# Patient Record
Sex: Female | Born: 1990
Health system: Southern US, Community
[De-identification: ages and names within clinical notes are randomized; demographics above are authoritative.]

## PROBLEM LIST (undated history)

## (undated) ENCOUNTER — Inpatient Hospital Stay (HOSPITAL_COMMUNITY): Payer: Self-pay

## (undated) DIAGNOSIS — R0602 Shortness of breath: Secondary | ICD-10-CM

## (undated) DIAGNOSIS — Z8619 Personal history of other infectious and parasitic diseases: Secondary | ICD-10-CM

## (undated) DIAGNOSIS — O24419 Gestational diabetes mellitus in pregnancy, unspecified control: Secondary | ICD-10-CM

## (undated) DIAGNOSIS — G8929 Other chronic pain: Secondary | ICD-10-CM

## (undated) DIAGNOSIS — M549 Dorsalgia, unspecified: Secondary | ICD-10-CM

## (undated) DIAGNOSIS — I10 Essential (primary) hypertension: Secondary | ICD-10-CM

## (undated) DIAGNOSIS — R519 Headache, unspecified: Secondary | ICD-10-CM

## (undated) DIAGNOSIS — R51 Headache: Secondary | ICD-10-CM

## (undated) DIAGNOSIS — J189 Pneumonia, unspecified organism: Secondary | ICD-10-CM

## (undated) DIAGNOSIS — A749 Chlamydial infection, unspecified: Secondary | ICD-10-CM

## (undated) DIAGNOSIS — R079 Chest pain, unspecified: Secondary | ICD-10-CM

## (undated) HISTORY — PX: NO PAST SURGERIES: SHX2092

## (undated) HISTORY — DX: Personal history of other infectious and parasitic diseases: Z86.19

## (undated) HISTORY — DX: Gestational diabetes mellitus in pregnancy, unspecified control: O24.419

---

## 2010-12-21 ENCOUNTER — Inpatient Hospital Stay (INDEPENDENT_AMBULATORY_CARE_PROVIDER_SITE_OTHER)
Admission: RE | Admit: 2010-12-21 | Discharge: 2010-12-21 | Disposition: A | Discharge status: Home / Self Care | Payer: Medicaid Other | Source: Ambulatory Visit

## 2010-12-21 DIAGNOSIS — M62838 Other muscle spasm: Secondary | ICD-10-CM

## 2011-05-07 ENCOUNTER — Emergency Department (HOSPITAL_COMMUNITY)
Admission: EM | Admit: 2011-05-07 | Discharge: 2011-05-07 | Disposition: A | Discharge status: Home / Self Care | Payer: Medicaid Other | Attending: Emergency Medicine | Admitting: Emergency Medicine

## 2011-05-07 DIAGNOSIS — J3489 Other specified disorders of nose and nasal sinuses: Secondary | ICD-10-CM | POA: Insufficient documentation

## 2011-05-07 DIAGNOSIS — J029 Acute pharyngitis, unspecified: Secondary | ICD-10-CM | POA: Insufficient documentation

## 2011-05-07 DIAGNOSIS — R6883 Chills (without fever): Secondary | ICD-10-CM | POA: Insufficient documentation

## 2011-06-24 ENCOUNTER — Encounter (HOSPITAL_COMMUNITY): Payer: Self-pay | Admitting: *Deleted

## 2011-06-24 ENCOUNTER — Inpatient Hospital Stay (HOSPITAL_COMMUNITY)
Admission: AD | Admit: 2011-06-24 | Discharge: 2011-06-24 | Disposition: A | Discharge status: Home / Self Care | Payer: Medicaid Other | Source: Ambulatory Visit | Attending: Family Medicine | Admitting: Family Medicine

## 2011-06-24 ENCOUNTER — Inpatient Hospital Stay (HOSPITAL_COMMUNITY): Payer: Medicaid Other

## 2011-06-24 DIAGNOSIS — A499 Bacterial infection, unspecified: Secondary | ICD-10-CM | POA: Insufficient documentation

## 2011-06-24 DIAGNOSIS — B9689 Other specified bacterial agents as the cause of diseases classified elsewhere: Secondary | ICD-10-CM | POA: Insufficient documentation

## 2011-06-24 DIAGNOSIS — N760 Acute vaginitis: Secondary | ICD-10-CM

## 2011-06-24 DIAGNOSIS — O209 Hemorrhage in early pregnancy, unspecified: Secondary | ICD-10-CM | POA: Insufficient documentation

## 2011-06-24 DIAGNOSIS — O2390 Unspecified genitourinary tract infection in pregnancy, unspecified trimester: Secondary | ICD-10-CM | POA: Insufficient documentation

## 2011-06-24 HISTORY — DX: Other chronic pain: G89.29

## 2011-06-24 HISTORY — DX: Dorsalgia, unspecified: M54.9

## 2011-06-24 LAB — CBC
Hemoglobin: 14.1 g/dL (ref 12.0–15.0)
MCH: 29.9 pg (ref 26.0–34.0)
MCV: 88.7 fL (ref 78.0–100.0)
RBC: 4.71 MIL/uL (ref 3.87–5.11)
WBC: 5.7 10*3/uL (ref 4.0–10.5)

## 2011-06-24 LAB — POCT PREGNANCY, URINE: Preg Test, Ur: POSITIVE

## 2011-06-24 LAB — URINALYSIS, ROUTINE W REFLEX MICROSCOPIC
Glucose, UA: NEGATIVE mg/dL
Leukocytes, UA: NEGATIVE
Nitrite: NEGATIVE
pH: 6 (ref 5.0–8.0)

## 2011-06-24 LAB — WET PREP, GENITAL: Yeast Wet Prep HPF POC: NONE SEEN

## 2011-06-24 LAB — URINE MICROSCOPIC-ADD ON

## 2011-06-24 LAB — ABO/RH: ABO/RH(D): A POS

## 2011-06-24 LAB — OB RESULTS CONSOLE HIV ANTIBODY (ROUTINE TESTING): HIV: NONREACTIVE

## 2011-06-24 LAB — OB RESULTS CONSOLE HEPATITIS B SURFACE ANTIGEN: Hepatitis B Surface Ag: NEGATIVE

## 2011-06-24 MED ORDER — ONDANSETRON 8 MG PO TBDP: 8.0000 mg | ORAL_TABLET | Freq: Three times a day (TID) | ORAL | Status: AC | PRN

## 2011-06-24 MED ORDER — PROMETHAZINE HCL 25 MG PO TABS: 25.0000 mg | ORAL_TABLET | Freq: Four times a day (QID) | ORAL | Status: DC | PRN

## 2011-06-24 MED ORDER — ONDANSETRON 8 MG PO TBDP
8.0000 mg | ORAL_TABLET | Freq: Once | ORAL | Status: AC
  Administered 2011-06-24: 8 mg via ORAL
  Filled 2011-06-24: qty 1

## 2011-06-24 MED ORDER — METRONIDAZOLE 500 MG PO TABS: 500.0000 mg | ORAL_TABLET | Freq: Two times a day (BID) | ORAL | Status: DC

## 2011-06-24 NOTE — Progress Notes (Signed)
Found out recently is preg.  Last night started bleeding. Woke up this morning there was spots on the pad. After voiding- more blood on paper.  Went to [facility]- had blood work done.

## 2011-06-24 NOTE — Progress Notes (Signed)
Pt in c/o vaginal bleeding since last night.  Put on pad last night, had spots on pad when she woke up today.  Reports small amount of bleeding after wiping,  Denies any cramping.  LMP 05/28/2011

## 2011-06-24 NOTE — ED Provider Notes (Signed)
History     Chief Complaint  Patient presents with  . Vaginal Bleeding   HPI Pt is early pregnantG1 with nausea and vomiting for about 2 weeks and vaginal spotting since yesterday. Pt went an Urgent Care with her nausea and vomiting and was told she was pregnant yesterday.  They did an Quant HCG which was 7.5. Pt denies any cramping constipation or UTI symptoms.  She had a loose bowel movement this morning and again noticed the spotting when she wiped.  Pt was not using anything for birth control.    Past Medical History  Diagnosis Date  . Chronic back pain     Past Surgical History  Procedure Date  . No past surgeries     No family history on file.  History  Substance Use Topics  . Smoking status: Former Games developer  . Smokeless tobacco: Not on file  . Alcohol Use: No    Allergies: No Known Allergies  Prescriptions prior to admission  Medication Sig Dispense Refill  . ibuprofen (ADVIL,MOTRIN) 800 MG tablet Take 800 mg by mouth every 8 (eight) hours as needed. Takes as needed for menstrual cramps or headache       . OVER THE COUNTER MEDICATION Take 1 capsule by mouth once. Pt states that she took a nyquil liquid cap one time      . prenatal vitamin w/FE, FA (PRENATAL 1 + 1) 27-1 MG TABS Take 1 tablet by mouth daily.        . Pseudoeph-Doxylamine-DM-APAP (NYQUIL PO) Take by mouth.          Review of Systems  Constitutional: Negative for fever and chills.  Gastrointestinal: Positive for nausea and vomiting. Negative for abdominal pain, diarrhea and constipation.  Genitourinary: Negative for dysuria and urgency.   Physical Exam   Blood pressure 134/69, pulse 63, temperature 98.5 F (36.9 C), temperature source Oral, resp. rate 20, height 5\' 4"  (1.626 m), weight 150 lb (68.04 kg), last menstrual period 05/28/2011.  Physical Exam  Vitals reviewed. Constitutional: She is oriented to person, place, and time. She appears well-developed and well-nourished.  Eyes: Pupils are  equal, round, and reactive to light.  Neck: Normal range of motion. Neck supple.  Cardiovascular: Normal rate.   Respiratory: Effort normal.  GI: Soft.  Genitourinary:       Small amount of opaque pink/red vaginal discharge in vault; cervix clean, nontender, uterus NSSC nontender;a dnexal without palpable enlargement or tenderness  Musculoskeletal: Normal range of motion.  Neurological: She is alert and oriented to person, place, and time.  Skin: Skin is warm and dry.  Psychiatric: She has a normal mood and affect.    MAU Course  Procedures Urinalysis- neg except for mod hemoglobin (many epith) CBC- normal HCG-45 ABO-Rh- positive Wet prep-mod clue cells GC/chlamydia- pending Ultrasound- normal -no evidence of pregnancy    Assessment and Plan  Bleeding in early pregnancy-return in 48 hours for repeat HCG Bacterial vaginosis-Rx for Flagyl 500mg  BID for 7 days  LINEBERRY,SUSAN 06/24/2011, 12:36 PM

## 2011-06-24 NOTE — Progress Notes (Signed)
Was seen at Urgent Care yesterday had BHCG of 7.6.

## 2011-06-25 LAB — GC/CHLAMYDIA PROBE AMP, GENITAL: GC Probe Amp, Genital: NEGATIVE

## 2011-06-26 ENCOUNTER — Inpatient Hospital Stay (HOSPITAL_COMMUNITY)
Admission: AD | Admit: 2011-06-26 | Discharge: 2011-06-26 | Disposition: A | Discharge status: Home / Self Care | Payer: Medicaid Other | Source: Ambulatory Visit | Attending: Obstetrics & Gynecology | Admitting: Obstetrics & Gynecology

## 2011-06-26 ENCOUNTER — Encounter (HOSPITAL_COMMUNITY): Payer: Self-pay | Admitting: Advanced Practice Midwife

## 2011-06-26 DIAGNOSIS — O26859 Spotting complicating pregnancy, unspecified trimester: Secondary | ICD-10-CM

## 2011-06-26 LAB — HCG, QUANTITATIVE, PREGNANCY: hCG, Beta Chain, Quant, S: 165 m[IU]/mL — ABNORMAL HIGH (ref <5)

## 2011-06-26 NOTE — ED Provider Notes (Signed)
Attestation of Attending Supervision of Advanced Practitioner: Evaluation and management procedures were performed by the PA/NP/CNM/OB Fellow under my supervision/collaboration. Chart reviewed and agree with management and plan.  Jimmie Rueter A 06/26/2011 4:51 PM

## 2011-06-26 NOTE — Progress Notes (Signed)
No spotting today, had spotting last night very light no pain.

## 2011-06-26 NOTE — ED Provider Notes (Signed)
History     Chief Complaint  Patient presents with  . Vaginal Bleeding   HPI Here for repeat quant. Was seen 2 days ago with spotting, no pain or bleeding today. Quant was 45 on 10/18, no IUP or adnexal mass on u/s.   OB History    Grav Para Term Preterm Abortions TAB SAB Ect Mult Living   1               Past Medical History  Diagnosis Date  . Chronic back pain     Past Surgical History  Procedure Date  . No past surgeries     No family history on file.  History  Substance Use Topics  . Smoking status: Former Games developer  . Smokeless tobacco: Not on file  . Alcohol Use: No    Allergies: No Known Allergies  Prescriptions prior to admission  Medication Sig Dispense Refill  . metroNIDAZOLE (FLAGYL) 500 MG tablet Take 1 tablet (500 mg total) by mouth 2 (two) times daily.  14 tablet  0  . ondansetron (ZOFRAN ODT) 8 MG disintegrating tablet Take 1 tablet (8 mg total) by mouth every 8 (eight) hours as needed for nausea.  20 tablet  0  . OVER THE COUNTER MEDICATION Take 1 capsule by mouth once. Pt states that she took a nyquil liquid cap one time      . prenatal vitamin w/FE, FA (PRENATAL 1 + 1) 27-1 MG TABS Take 1 tablet by mouth daily.        . promethazine (PHENERGAN) 25 MG tablet Take 1 tablet (25 mg total) by mouth every 6 (six) hours as needed for nausea.  30 tablet  0  . Pseudoeph-Doxylamine-DM-APAP (NYQUIL PO) Take by mouth.          Review of Systems  Constitutional: Negative.   Respiratory: Negative.   Cardiovascular: Negative.   Gastrointestinal: Negative.   Genitourinary: Negative.   Musculoskeletal: Negative.   Neurological: Negative.   Psychiatric/Behavioral: Negative.    Physical Exam   Blood pressure 132/72, pulse 65, temperature 98.4 F (36.9 C), temperature source Oral, resp. rate 18, last menstrual period 05/28/2011.  Physical Exam  Nursing note and vitals reviewed. Constitutional: She is oriented to person, place, and time. She appears  well-developed and well-nourished. No distress.  Cardiovascular: Normal rate.   Respiratory: Effort normal.  GI: Soft. She exhibits no distension.  Musculoskeletal: Normal range of motion.  Neurological: She is alert and oriented to person, place, and time.  Skin: Skin is warm and dry.  Psychiatric: She has a normal mood and affect.    MAU Course  Procedures Results for orders placed during the hospital encounter of 06/26/11 (from the past 24 hour(s))  HCG, QUANTITATIVE, PREGNANCY     Status: Abnormal   Collection Time   06/26/11 10:25 AM      Component Value Range   hCG, Beta Chain, Quant, S 165 (*) <5 (mIU/mL)     Assessment and Plan  Normal rise in HCG, f/u for u/s on Monday 10/29, precautions rev'd  FRAZIER,NATALIE 06/26/2011, 11:24 AM

## 2011-06-27 ENCOUNTER — Inpatient Hospital Stay (HOSPITAL_COMMUNITY)
Admission: AD | Admit: 2011-06-27 | Discharge: 2011-06-27 | Disposition: A | Discharge status: Home / Self Care | Payer: Medicaid Other | Source: Ambulatory Visit | Attending: Obstetrics & Gynecology | Admitting: Obstetrics & Gynecology

## 2011-06-27 DIAGNOSIS — O210 Mild hyperemesis gravidarum: Secondary | ICD-10-CM | POA: Insufficient documentation

## 2011-06-27 DIAGNOSIS — O219 Vomiting of pregnancy, unspecified: Secondary | ICD-10-CM

## 2011-06-27 LAB — URINE MICROSCOPIC-ADD ON

## 2011-06-27 LAB — URINALYSIS, ROUTINE W REFLEX MICROSCOPIC
Bilirubin Urine: NEGATIVE
Ketones, ur: NEGATIVE mg/dL
Leukocytes, UA: NEGATIVE
Nitrite: NEGATIVE
Urobilinogen, UA: 0.2 mg/dL (ref 0.0–1.0)
pH: 7 (ref 5.0–8.0)

## 2011-06-27 NOTE — Progress Notes (Signed)
Pt also states chest feel tight since I've been here."

## 2011-06-27 NOTE — Progress Notes (Signed)
Pt states, " I've been vomiting for 2 wks . Tonight I took my medicine for nausea and it made me dizzy and lightheaded. I got more nauseated and started vomiting."

## 2011-06-27 NOTE — ED Provider Notes (Signed)
History     Chief Complaint  Patient presents with  . Morning Sickness   HPI Pamela Klein 20 y.o. 4w 2d gestation.  Arrived in MAU with several episodes of severe vomiting.  Was hard to get her breath when vomiting.   OB History    Grav Para Term Preterm Abortions TAB SAB Ect Mult Living   1               Past Medical History  Diagnosis Date  . Chronic back pain     Past Surgical History  Procedure Date  . No past surgeries     No family history on file.  History  Substance Use Topics  . Smoking status: Former Games developer  . Smokeless tobacco: Not on file  . Alcohol Use: No    Allergies: No Known Allergies  Prescriptions prior to admission  Medication Sig Dispense Refill  . metroNIDAZOLE (FLAGYL) 500 MG tablet Take 1 tablet (500 mg total) by mouth 2 (two) times daily.  14 tablet  0  . ondansetron (ZOFRAN ODT) 8 MG disintegrating tablet Take 1 tablet (8 mg total) by mouth every 8 (eight) hours as needed for nausea.  20 tablet  0  . OVER THE COUNTER MEDICATION Take 1 capsule by mouth once. Pt states that she took a nyquil liquid cap one time      . prenatal vitamin w/FE, FA (PRENATAL 1 + 1) 27-1 MG TABS Take 1 tablet by mouth daily.        . promethazine (PHENERGAN) 25 MG tablet Take 1 tablet (25 mg total) by mouth every 6 (six) hours as needed for nausea.  30 tablet  0  . Pseudoeph-Doxylamine-DM-APAP (NYQUIL PO) Take by mouth.          Review of Systems  Gastrointestinal: Positive for nausea and vomiting.   Physical Exam   Blood pressure 138/88, pulse 87, temperature 98.6 F (37 C), temperature source Oral, resp. rate 20, height 5\' 4"  (1.626 m), weight 150 lb (68.04 kg), last menstrual period 05/28/2011.  Physical Exam  Nursing note and vitals reviewed. Constitutional: She is oriented to person, place, and time. She appears well-developed and well-nourished.  HENT:  Head: Normocephalic.  Eyes: EOM are normal.  Neck: Neck supple.  Musculoskeletal: Normal  range of motion.  Neurological: She is alert and oriented to person, place, and time.  Skin: Skin is warm and dry.  Psychiatric: She has a normal mood and affect.    MAU Course  Procedures  MDM Client has rested in bed and is now feeling much better.  Is ready to go home.  Has not had vomiting previously in this pregnancy.  Was here yesterday for BHCG follow up.   Reviewed her medications with her - has Phenergan tablets and Zofran ODT.  Was taking metronidazole but suspect the metronidazole is causing the gastric upset.  Advised her to stop Metronidazole at present.  Assessment and Plan  Nausea and vomiting.  Plan: Take your medications for nausea as ordered Keep your appointment for your ultrasound on Monday 07-05-11 Return sooner if you have worsening symptoms.  BURLESON,TERRI 06/27/2011, 1:19 AM   Nolene Bernheim, NP 06/27/11 0123

## 2011-06-28 NOTE — ED Provider Notes (Signed)
Chart reviewed and agree with management and plan.  

## 2011-07-05 ENCOUNTER — Ambulatory Visit (HOSPITAL_COMMUNITY)
Admission: RE | Admit: 2011-07-05 | Discharge: 2011-07-05 | Disposition: A | Discharge status: Home / Self Care | Payer: Medicaid Other | Source: Ambulatory Visit | Attending: Advanced Practice Midwife | Admitting: Advanced Practice Midwife

## 2011-07-05 ENCOUNTER — Ambulatory Visit (HOSPITAL_COMMUNITY): Payer: Medicaid Other

## 2011-07-05 DIAGNOSIS — O26859 Spotting complicating pregnancy, unspecified trimester: Secondary | ICD-10-CM

## 2011-07-05 DIAGNOSIS — Z3689 Encounter for other specified antenatal screening: Secondary | ICD-10-CM | POA: Insufficient documentation

## 2011-07-05 DIAGNOSIS — R109 Unspecified abdominal pain: Secondary | ICD-10-CM | POA: Insufficient documentation

## 2011-07-05 DIAGNOSIS — O99891 Other specified diseases and conditions complicating pregnancy: Secondary | ICD-10-CM | POA: Insufficient documentation

## 2011-07-08 ENCOUNTER — Inpatient Hospital Stay (HOSPITAL_COMMUNITY)
Admission: AD | Admit: 2011-07-08 | Discharge: 2011-07-08 | Disposition: A | Discharge status: Home / Self Care | Payer: Medicaid Other | Source: Ambulatory Visit | Attending: Obstetrics & Gynecology | Admitting: Obstetrics & Gynecology

## 2011-07-08 ENCOUNTER — Encounter (HOSPITAL_COMMUNITY): Payer: Self-pay

## 2011-07-08 DIAGNOSIS — O210 Mild hyperemesis gravidarum: Secondary | ICD-10-CM | POA: Insufficient documentation

## 2011-07-08 DIAGNOSIS — O219 Vomiting of pregnancy, unspecified: Secondary | ICD-10-CM

## 2011-07-08 LAB — URINALYSIS, ROUTINE W REFLEX MICROSCOPIC
Glucose, UA: NEGATIVE mg/dL
Leukocytes, UA: NEGATIVE
Nitrite: NEGATIVE
Specific Gravity, Urine: >1.03 — ABNORMAL HIGH (ref 1.005–1.030)
pH: 6 (ref 5.0–8.0)

## 2011-07-08 LAB — COMPREHENSIVE METABOLIC PANEL
ALT: 10 U/L (ref 0–35)
Alkaline Phosphatase: 48 U/L (ref 39–117)
CO2: 23 mEq/L (ref 19–32)
Chloride: 100 mEq/L (ref 96–112)
GFR calc Af Amer: >90 mL/min (ref >90)
GFR calc non Af Amer: >90 mL/min (ref >90)
Glucose, Bld: 89 mg/dL (ref 70–99)
Potassium: 3.6 mEq/L (ref 3.5–5.1)
Sodium: 132 mEq/L — ABNORMAL LOW (ref 135–145)
Total Bilirubin: 0.2 mg/dL — ABNORMAL LOW (ref 0.3–1.2)

## 2011-07-08 LAB — CBC
HCT: 40 % (ref 36.0–46.0)
Hemoglobin: 13.8 g/dL (ref 12.0–15.0)
RBC: 4.58 MIL/uL (ref 3.87–5.11)

## 2011-07-08 MED ORDER — PROMETHAZINE HCL 25 MG/ML IJ SOLN
25.0000 mg | Freq: Once | INTRAMUSCULAR | Status: AC
  Administered 2011-07-08: 25 mg via INTRAVENOUS
  Filled 2011-07-08: qty 1

## 2011-07-08 MED ORDER — LACTATED RINGERS IV BOLUS (SEPSIS)
1000.0000 mL | Freq: Once | INTRAVENOUS | Status: AC
  Administered 2011-07-08: 1000 mL via INTRAVENOUS

## 2011-07-08 MED ORDER — PROMETHAZINE HCL 25 MG RE SUPP: 25.0000 mg | Freq: Four times a day (QID) | RECTAL | Status: DC | PRN

## 2011-07-08 NOTE — Progress Notes (Signed)
Patient states she has been having vomiting for a while, worse today. Has been dizzy and has felt feverish(has not taken temp) and lower abdominal cramping.

## 2011-07-08 NOTE — ED Provider Notes (Addendum)
History   Pt presents today c/o worsening N&V over the past 2 days. She states she can no longer keep anything on her stomach. She denies vag dc, bleeding, although she thinks she may have had a fever. She has tried po phenergan and zofran ODT without relief.  Chief Complaint  Patient presents with  . Emesis   HPI  OB History    Grav Para Term Preterm Abortions TAB SAB Ect Mult Living   1               Past Medical History  Diagnosis Date  . Chronic back pain     Past Surgical History  Procedure Date  . No past surgeries     No family history on file.  History  Substance Use Topics  . Smoking status: Former Games developer  . Smokeless tobacco: Not on file  . Alcohol Use: No    Allergies: No Known Allergies  Prescriptions prior to admission  Medication Sig Dispense Refill  . ondansetron (ZOFRAN-ODT) 8 MG disintegrating tablet Take 8 mg by mouth every 8 (eight) hours as needed. nausea       . prenatal vitamin w/FE, FA (PRENATAL 1 + 1) 27-1 MG TABS Take 1 tablet by mouth daily.       . promethazine (PHENERGAN) 25 MG tablet Take 25 mg by mouth every 6 (six) hours as needed. Nausea/sleep          Review of Systems  Constitutional: Positive for fever.  Cardiovascular: Negative for chest pain.  Gastrointestinal: Positive for nausea, vomiting and constipation. Negative for abdominal pain and diarrhea.  Genitourinary: Negative for dysuria, urgency, frequency and hematuria.  Neurological: Positive for dizziness. Negative for headaches.  Psychiatric/Behavioral: Negative for depression and suicidal ideas.   Physical Exam   Blood pressure 140/114, pulse 90, temperature 99.4 F (37.4 C), temperature source Oral, resp. rate 16, height 5' 5.5" (1.664 m), weight 148 lb 12.8 oz (67.495 kg), last menstrual period 05/28/2011, SpO2 98.00%.  Physical Exam  Nursing note and vitals reviewed. Constitutional: She is oriented to person, place, and time. She appears well-developed and  well-nourished. No distress.  HENT:  Head: Normocephalic and atraumatic.  Eyes: EOM are normal. Pupils are equal, round, and reactive to light.  GI: Soft. She exhibits no distension. There is no tenderness. There is no rebound and no guarding.  Neurological: She is alert and oriented to person, place, and time.  Skin: Skin is warm and dry. She is not diaphoretic.  Psychiatric: She has a normal mood and affect. Her behavior is normal. Judgment and thought content normal.    MAU Course  Procedures   Assessment and Plan  Care of pt turned over to Georges Mouse, CNM.  Clinton Gallant. Rice III, DrHSc, MPAS, PA-C  07/08/2011, 7:31 PM   Henrietta Hoover, PA 07/08/11 1949   Following IV hydration and Phenergan IV, pt tolerating ice chips. If no vomiting once liter of fluid is complete, will d/c home with phenergan suppositories.   A/P: 20 y.o. G1P0 at [redacted]w[redacted]d N/V of pregnancy Rx phenergan suppositories Has appt scheduled at [facility] for prenatal care on 11/20

## 2011-07-08 NOTE — ED Provider Notes (Signed)
Attestation of Attending Supervision of Advanced Practitioner: Evaluation and management procedures were performed by the PA/NP/CNM/OB Fellow under my supervision/collaboration. Chart reviewed and agree with management and plan.  Jaynie Collins A M.D. 07/08/2011 8:03 PM

## 2011-07-12 NOTE — ED Provider Notes (Signed)
Attestation of Attending Supervision of Advanced Practitioner: Evaluation and management procedures were performed by the PA/NP/CNM/OB Fellow under my supervision/collaboration. Chart reviewed, and agree with management and plan.  Jaynie Collins A M.D. 07/12/2011 11:10 AM

## 2011-07-27 LAB — OB RESULTS CONSOLE ANTIBODY SCREEN: Antibody Screen: NEGATIVE

## 2011-09-07 NOTE — L&D Delivery Note (Signed)
Delivery Note  Pt was involuntarily pushing and cervix was complete at 2146, AROM w mod mec noted and vertex began to crown,   At 9:50 PM a viable female was delivered via Vaginal, Spontaneous Delivery (Presentation: Right Occiput Anterior).  Delivered through loose nuchal cord, and as I was somersaulting baby cord avulsed at umbilicus, it was then immediately clamped,  APGAR: 8, 9; weight 5 lb 0.4 oz (2279 g).  Dr. Francine Graven from NICU came to assess infant  Placenta status: Intact, Spontaneous Pathology.  Cord: 3 vessels with the following complications: .  Cord pH: n/a  Anesthesia: Local  Episiotomy: None Lacerations: R Labial;1st degree Suture Repair: 3.0 vicryl rapide Est. Blood Loss (mL): 300  Mom to postpartum.  Baby to nursery-stable. Skin-skin w mom Pt plans breast and bottle   Cheyne Bungert M 02/27/2012, 10:27 PM

## 2011-09-11 ENCOUNTER — Inpatient Hospital Stay (HOSPITAL_COMMUNITY)
Admission: AD | Admit: 2011-09-11 | Discharge: 2011-09-12 | Disposition: A | Discharge status: Home / Self Care | Payer: Medicaid Other | Source: Ambulatory Visit | Attending: Family Medicine | Admitting: Family Medicine

## 2011-09-11 DIAGNOSIS — O99891 Other specified diseases and conditions complicating pregnancy: Secondary | ICD-10-CM | POA: Insufficient documentation

## 2011-09-11 DIAGNOSIS — N949 Unspecified condition associated with female genital organs and menstrual cycle: Secondary | ICD-10-CM

## 2011-09-11 DIAGNOSIS — R109 Unspecified abdominal pain: Secondary | ICD-10-CM | POA: Insufficient documentation

## 2011-09-11 HISTORY — DX: Chlamydial infection, unspecified: A74.9

## 2011-09-12 ENCOUNTER — Encounter (HOSPITAL_COMMUNITY): Payer: Self-pay

## 2011-09-12 LAB — URINALYSIS, ROUTINE W REFLEX MICROSCOPIC
Bilirubin Urine: NEGATIVE
Ketones, ur: NEGATIVE mg/dL
Nitrite: NEGATIVE
Protein, ur: NEGATIVE mg/dL
Urobilinogen, UA: 0.2 mg/dL (ref 0.0–1.0)
pH: 7 (ref 5.0–8.0)

## 2011-09-12 LAB — WET PREP, GENITAL: Clue Cells Wet Prep HPF POC: NONE SEEN

## 2011-09-12 MED ORDER — ACETAMINOPHEN-CODEINE #3 300-30 MG PO TABS
1.0000 | ORAL_TABLET | Freq: Once | ORAL | Status: AC
  Administered 2011-09-12: 1 via ORAL
  Filled 2011-09-12: qty 1

## 2011-09-12 NOTE — ED Provider Notes (Signed)
Chart reviewed and agree with management and plan.  

## 2011-09-12 NOTE — Progress Notes (Signed)
Pt states, " I was hitting down and  and I started having pain in my right lower abdomen, and now it has moved to the left lower abdomen also but not as bad. I've had a brownish discharge today."

## 2011-09-12 NOTE — ED Provider Notes (Signed)
History     Chief Complaint  Patient presents with  . Abdominal Pain   HPI  Pt here with report of cramping pain that started today.  Denies UTI symptoms or vaginal bleeding.  +vaginal discharge, light brown.  Denies odor.    Past Medical History  Diagnosis Date  . Chronic back pain   . Chlamydia     Past Surgical History  Procedure Date  . No past surgeries     History reviewed. No pertinent family history.  History  Substance Use Topics  . Smoking status: Former Games developer  . Smokeless tobacco: Not on file  . Alcohol Use: No    Allergies: No Known Allergies  Prescriptions prior to admission  Medication Sig Dispense Refill  . prenatal vitamin w/FE, FA (PRENATAL 1 + 1) 27-1 MG TABS Take 1 tablet by mouth daily.         Review of Systems  Gastrointestinal: Positive for abdominal pain.  Genitourinary:       Light brown discharge   Physical Exam   Blood pressure 137/73, pulse 100, temperature 98.5 F (36.9 C), temperature source Oral, resp. rate 20, height 5' 4.5" (1.638 m), weight 73.143 kg (161 lb 4 oz), last menstrual period 05/28/2011.  Physical Exam  Constitutional: She is oriented to person, place, and time. She appears well-developed and well-nourished.  HENT:  Head: Normocephalic.  Neck: Normal range of motion. Neck supple.  Cardiovascular: Normal rate, regular rhythm and normal heart sounds.   Respiratory: Effort normal and breath sounds normal.  Genitourinary: Guaiac stool:   No bleeding around the vagina.  Neurological: She is alert and oriented to person, place, and time.  Skin: Skin is warm and dry.  +FHTs  MAU Course  Procedures Results for orders placed during the hospital encounter of 09/11/11 (from the past 24 hour(s))  URINALYSIS, ROUTINE W REFLEX MICROSCOPIC     Status: Normal   Collection Time   09/12/11 12:15 AM      Component Value Range   Color, Urine YELLOW  YELLOW    APPearance CLEAR  CLEAR    Specific Gravity, Urine 1.010  1.005 -  1.030    pH 7.0  5.0 - 8.0    Glucose, UA NEGATIVE  NEGATIVE (mg/dL)   Hgb urine dipstick NEGATIVE  NEGATIVE    Bilirubin Urine NEGATIVE  NEGATIVE    Ketones, ur NEGATIVE  NEGATIVE (mg/dL)   Protein, ur NEGATIVE  NEGATIVE (mg/dL)   Urobilinogen, UA 0.2  0.0 - 1.0 (mg/dL)   Nitrite NEGATIVE  NEGATIVE    Leukocytes, UA NEGATIVE  NEGATIVE   WET PREP, GENITAL     Status: Abnormal   Collection Time   09/12/11  2:00 AM      Component Value Range   Yeast, Wet Prep NONE SEEN  NONE SEEN    Trich, Wet Prep NONE SEEN  NONE SEEN    Clue Cells, Wet Prep NONE SEEN  NONE SEEN    WBC, Wet Prep HPF POC FEW (*) NONE SEEN      Assessment and Plan  Round Ligament Pain  Plan: DC to home Reviewed pregnancy precautions GC/CT pending [facility] 09/12/2011, 1:19 AM

## 2011-09-12 NOTE — Progress Notes (Signed)
Patient is here with c/o new onset of lower/pelvic abdominal pain. She is tearful stating that the pain is severe. She c/o brown vaginal discharge. She denies any bleeding. Her next appointment with [facility] is 09/21/2011.

## 2011-09-13 LAB — GC/CHLAMYDIA PROBE AMP, GENITAL: Chlamydia, DNA Probe: NEGATIVE

## 2011-09-20 ENCOUNTER — Encounter (HOSPITAL_COMMUNITY): Payer: Self-pay | Admitting: *Deleted

## 2011-09-20 ENCOUNTER — Inpatient Hospital Stay (HOSPITAL_COMMUNITY)
Admission: AD | Admit: 2011-09-20 | Discharge: 2011-09-20 | Disposition: A | Discharge status: Home / Self Care | Payer: Medicaid Other | Source: Ambulatory Visit | Attending: Obstetrics | Admitting: Obstetrics

## 2011-09-20 DIAGNOSIS — A499 Bacterial infection, unspecified: Secondary | ICD-10-CM | POA: Insufficient documentation

## 2011-09-20 DIAGNOSIS — N760 Acute vaginitis: Secondary | ICD-10-CM | POA: Insufficient documentation

## 2011-09-20 DIAGNOSIS — O2340 Unspecified infection of urinary tract in pregnancy, unspecified trimester: Secondary | ICD-10-CM

## 2011-09-20 DIAGNOSIS — L293 Anogenital pruritus, unspecified: Secondary | ICD-10-CM | POA: Insufficient documentation

## 2011-09-20 DIAGNOSIS — O2390 Unspecified genitourinary tract infection in pregnancy, unspecified trimester: Secondary | ICD-10-CM | POA: Insufficient documentation

## 2011-09-20 DIAGNOSIS — B9689 Other specified bacterial agents as the cause of diseases classified elsewhere: Secondary | ICD-10-CM | POA: Insufficient documentation

## 2011-09-20 DIAGNOSIS — N390 Urinary tract infection, site not specified: Secondary | ICD-10-CM | POA: Insufficient documentation

## 2011-09-20 LAB — URINE MICROSCOPIC-ADD ON

## 2011-09-20 LAB — URINALYSIS, ROUTINE W REFLEX MICROSCOPIC
Ketones, ur: NEGATIVE mg/dL
Nitrite: NEGATIVE
Protein, ur: NEGATIVE mg/dL
pH: 7 (ref 5.0–8.0)

## 2011-09-20 LAB — WET PREP, GENITAL

## 2011-09-20 MED ORDER — LIDOCAINE HCL 2 % EX GEL
Freq: Once | CUTANEOUS | Status: AC
  Administered 2011-09-20: 10 via TOPICAL
  Filled 2011-09-20: qty 20

## 2011-09-20 MED ORDER — NITROFURANTOIN MONOHYD MACRO 100 MG PO CAPS: 100.0000 mg | ORAL_CAPSULE | Freq: Two times a day (BID) | ORAL | Status: AC

## 2011-09-20 MED ORDER — METRONIDAZOLE 500 MG PO TABS: 500.0000 mg | ORAL_TABLET | Freq: Two times a day (BID) | ORAL | Status: AC

## 2011-09-20 NOTE — Progress Notes (Signed)
Pt states vaginal pain/burning, having discharge brownish red in color. Unsure if she has std. Painful to void/wash/wipe. Last intercourse 1 week ago with same partner. Pain x5days.

## 2011-09-20 NOTE — ED Provider Notes (Signed)
History    G1 presents at 16w3 with vaginal discharge, itching, and burning that is painful and makes it difficult for pt to walk.  Symptoms started 1 week ago but have worsened since onset.  Reports some menstrual-like irregular cramping x2 weeks.  Also reports brown vaginal spotting x2 days.  Denies new sexual partner other than FOB.  No LOF other than brown discharge.  Chief Complaint  Patient presents with  . Vaginal Discharge   HPI  OB History    Grav Para Term Preterm Abortions TAB SAB Ect Mult Living   1               Past Medical History  Diagnosis Date  . Chronic back pain   . Chlamydia     Past Surgical History  Procedure Date  . No past surgeries     No family history on file.  History  Substance Use Topics  . Smoking status: Former Games developer  . Smokeless tobacco: Not on file  . Alcohol Use: No    Allergies: No Known Allergies  Prescriptions prior to admission  Medication Sig Dispense Refill  . prenatal vitamin w/FE, FA (PRENATAL 1 + 1) 27-1 MG TABS Take 1 tablet by mouth daily.         Review of Systems  Constitutional: Negative.   HENT: Negative.   Eyes: Negative.   Respiratory: Negative.   Cardiovascular: Negative.   Gastrointestinal: Negative.   Genitourinary: Negative.   Musculoskeletal: Negative.   Skin: Negative.   Neurological: Negative.   Endo/Heme/Allergies: Negative.   Psychiatric/Behavioral: Negative.    Physical Exam   Blood pressure 110/70, pulse 79, temperature 98.5 F (36.9 C), temperature source Oral, resp. rate 16, height 5\' 4"  (1.626 m), weight 73.936 kg (163 lb), last menstrual period 05/28/2011.  Physical Exam  Constitutional: She is oriented to person, place, and time. She appears well-developed and well-nourished.  Neck: Normal range of motion.  Respiratory: Effort normal.  GI: Soft.  Genitourinary: Cervix exhibits no motion tenderness and no friability. There is erythema and tenderness around the vagina.       Large  amount white creamy discharge on vaginal walls.  No lesions noted externally or with internal exam Cervix 0/th/hi  Neurological: She is alert and oriented to person, place, and time. She has normal reflexes.  Skin: Skin is warm and dry.  Psychiatric: She has a normal mood and affect. Her behavior is normal. Judgment and thought content normal.    MAU Course  Procedures U/A GC/Chlamydia  Wet prep Cervical exam  MDM Topical lidocaine given in MAU Reviewed care with Dr Clearance Coots and agrees with plan to treat BV and UTI and D/C home.  Assessment and Plan  A: Bacterial vaginosis UTI   P: D/C home Prescriptions for Flagyl and Macrobid Keep f/u appointment with [facility] scheduled tomorrow.   LEFTWICH-KIRBY, LISA 09/20/2011, 6:53 PM

## 2011-09-21 LAB — GC/CHLAMYDIA PROBE AMP, GENITAL: GC Probe Amp, Genital: NEGATIVE

## 2011-09-21 NOTE — ED Provider Notes (Signed)
Attestation of Attending Supervision of Advanced Practitioner: Evaluation and management procedures were performed by the PA/NP/CNM/OB Fellow under my supervision/collaboration. Chart reviewed, and agree with management and plan.  UGONNA ANYANWU, M.D. 09/21/2011 7:27 AM   

## 2011-10-25 ENCOUNTER — Inpatient Hospital Stay (HOSPITAL_COMMUNITY)
Admission: AD | Admit: 2011-10-25 | Discharge: 2011-10-25 | Disposition: A | Discharge status: Home / Self Care | Payer: Medicaid Other | Source: Ambulatory Visit | Attending: Obstetrics & Gynecology | Admitting: Obstetrics & Gynecology

## 2011-10-25 ENCOUNTER — Encounter (HOSPITAL_COMMUNITY): Payer: Self-pay

## 2011-10-25 DIAGNOSIS — B349 Viral infection, unspecified: Secondary | ICD-10-CM

## 2011-10-25 DIAGNOSIS — O99891 Other specified diseases and conditions complicating pregnancy: Secondary | ICD-10-CM | POA: Insufficient documentation

## 2011-10-25 DIAGNOSIS — O210 Mild hyperemesis gravidarum: Secondary | ICD-10-CM | POA: Insufficient documentation

## 2011-10-25 DIAGNOSIS — B9789 Other viral agents as the cause of diseases classified elsewhere: Secondary | ICD-10-CM

## 2011-10-25 LAB — CBC
Platelets: 175 10*3/uL (ref 150–400)
RBC: 4.09 MIL/uL (ref 3.87–5.11)
WBC: 7.2 10*3/uL (ref 4.0–10.5)

## 2011-10-25 LAB — COMPREHENSIVE METABOLIC PANEL
ALT: 15 U/L (ref 0–35)
AST: 13 U/L (ref 0–37)
CO2: 24 mEq/L (ref 19–32)
Chloride: 100 mEq/L (ref 96–112)
GFR calc non Af Amer: >90 mL/min (ref >90)
Potassium: 3.6 mEq/L (ref 3.5–5.1)
Sodium: 132 mEq/L — ABNORMAL LOW (ref 135–145)
Total Bilirubin: 0.1 mg/dL — ABNORMAL LOW (ref 0.3–1.2)

## 2011-10-25 MED ORDER — LACTATED RINGERS IV BOLUS (SEPSIS)
1000.0000 mL | Freq: Once | INTRAVENOUS | Status: AC
  Administered 2011-10-25: 1000 mL via INTRAVENOUS

## 2011-10-25 MED ORDER — PROMETHAZINE HCL 25 MG/ML IJ SOLN
12.5000 mg | Freq: Once | INTRAMUSCULAR | Status: AC
  Administered 2011-10-25: 12.5 mg via INTRAVENOUS
  Filled 2011-10-25: qty 1

## 2011-10-25 MED ORDER — GUAIFENESIN-CODEINE 100-10 MG/5ML PO SYRP: 5.0000 mL | ORAL_SOLUTION | Freq: Every evening | ORAL | Status: AC | PRN

## 2011-10-25 MED ORDER — PROMETHAZINE HCL 12.5 MG PO TABS: 12.5000 mg | ORAL_TABLET | Freq: Four times a day (QID) | ORAL | Status: DC | PRN

## 2011-10-25 MED ORDER — ACETAMINOPHEN-CODEINE #3 300-30 MG PO TABS
1.0000 | ORAL_TABLET | Freq: Once | ORAL | Status: AC
  Administered 2011-10-25: 1 via ORAL
  Filled 2011-10-25: qty 1

## 2011-10-25 NOTE — Discharge Instructions (Signed)
Viral Infections A viral infection can be caused by different types of viruses.Most viral infections are not serious and resolve on their own. However, some infections may cause severe symptoms and may lead to further complications. SYMPTOMS Viruses can frequently cause:  Minor sore throat.   Aches and pains.   Headaches.   Runny nose.   Different types of rashes.   Watery eyes.   Tiredness.   Cough.   Loss of appetite.   Gastrointestinal infections, resulting in nausea, vomiting, and diarrhea.  These symptoms do not respond to antibiotics because the infection is not caused by bacteria. However, you might catch a bacterial infection following the viral infection. This is sometimes called a "superinfection." Symptoms of such a bacterial infection may include:  Worsening sore throat with pus and difficulty swallowing.   Swollen neck glands.   Chills and a high or persistent fever.   Severe headache.   Tenderness over the sinuses.   Persistent overall ill feeling (malaise), muscle aches, and tiredness (fatigue).   Persistent cough.   Yellow, green, or brown mucus production with coughing.  HOME CARE INSTRUCTIONS   Only take over-the-counter or prescription medicines for pain, discomfort, diarrhea, or fever as directed by your caregiver.   Drink enough water and fluids to keep your urine clear or pale yellow. Sports drinks can provide valuable electrolytes, sugars, and hydration.   Get plenty of rest and maintain proper nutrition. Soups and broths with crackers or rice are fine.  SEEK IMMEDIATE MEDICAL CARE IF:   You have severe headaches, shortness of breath, chest pain, neck pain, or an unusual rash.   You have uncontrolled vomiting, diarrhea, or you are unable to keep down fluids.   You or your child has an oral temperature above 102 F (38.9 C), not controlled by medicine.   Your baby is older than 3 months with a rectal temperature of 102 F (38.9 C) or  higher.   Your baby is 3 months old or younger with a rectal temperature of 100.4 F (38 C) or higher.  MAKE SURE YOU:   Understand these instructions.   Will watch your condition.   Will get help right away if you are not doing well or get worse.  Document Released: 06/02/2005 Document Revised: 05/05/2011 Document Reviewed: 12/28/2010 [employer] Patient Information 2012 [employer]. 

## 2011-10-25 NOTE — Progress Notes (Signed)
Pt reports improvement in nausea with medication.

## 2011-10-25 NOTE — Progress Notes (Signed)
Haven't felt good since Fri.  Just don't feel good, nause, vomiting.  Emesis has been blood streaked, short of breath, cough, sharp pains and tightness in chest, and pains in breast.  Decreased fetal movement.

## 2011-10-25 NOTE — ED Provider Notes (Signed)
History     Chief Complaint  Patient presents with  . Emesis   HPI  Pt reports feeling nausea and vomiting since Friday; "Don't feel good";   Unable to hold down anything including water;  short of breath, cough, intermittent, sharp pains and tightness in chest, and pains in breast. Decreased fetal movement.  Denies fever.  +body aches.     Past Medical History  Diagnosis Date  . Chronic back pain   . Chlamydia     Past Surgical History  Procedure Date  . No past surgeries     History reviewed. No pertinent family history.  History  Substance Use Topics  . Smoking status: Former Games developer  . Smokeless tobacco: Not on file  . Alcohol Use: No    Allergies: No Known Allergies  Prescriptions prior to admission  Medication Sig Dispense Refill  . prenatal vitamin w/FE, FA (PRENATAL 1 + 1) 27-1 MG TABS Take 1 tablet by mouth daily.         Review of Systems  Constitutional: Positive for chills and malaise/fatigue. Negative for fever.  HENT: Positive for congestion. Negative for sore throat.   Respiratory: Positive for cough, sputum production and shortness of breath. Negative for wheezing.   Gastrointestinal: Positive for nausea, vomiting and diarrhea ( 5 loose stools in 24 hours).  Genitourinary: Negative.   Musculoskeletal: Positive for myalgias.  Skin: Negative for itching and rash.  Neurological: Positive for weakness.   Physical Exam   Blood pressure 137/75, pulse 101, temperature 99.1 F (37.3 C), temperature source Oral, resp. rate 20, height 5\' 5"  (1.651 m), weight 78.019 kg (172 lb), last menstrual period 05/28/2011, SpO2 100.00%.  Physical Exam  Constitutional: She is oriented to person, place, and time. She appears well-developed and well-nourished. She appears distressed (appears uncomfortable, "ill").  HENT:  Head: Normocephalic.  Mouth/Throat: Mucous membranes are dry.  Neck: Normal range of motion. Neck supple.  Cardiovascular: Normal rate, regular  rhythm and normal heart sounds.   Respiratory: Effort normal and breath sounds normal. No respiratory distress. She has no wheezes. She has no rales. She exhibits no tenderness.  GI: Soft. There is no tenderness.  Genitourinary: No bleeding around the vagina.  Neurological: She is alert and oriented to person, place, and time. She has normal reflexes.  Skin: Skin is warm and dry. She is not diaphoretic.   Doppler - FHR 146  MAU Course  Procedures  IV LR IV Phenergan  Results for orders placed during the hospital encounter of 10/25/11 (from the past 24 hour(s))  CBC     Status: Abnormal   Collection Time   10/25/11 11:35 AM      Component Value Range   WBC 7.2  4.0 - 10.5 (K/uL)   RBC 4.09  3.87 - 5.11 (MIL/uL)   Hemoglobin 12.2  12.0 - 15.0 (g/dL)   HCT 14.7 (*) 82.9 - 46.0 (%)   MCV 87.8  78.0 - 100.0 (fL)   MCH 29.8  26.0 - 34.0 (pg)   MCHC 34.0  30.0 - 36.0 (g/dL)   RDW 56.2  13.0 - 86.5 (%)   Platelets 175  150 - 400 (K/uL)  COMPREHENSIVE METABOLIC PANEL     Status: Abnormal   Collection Time   10/25/11 11:35 AM      Component Value Range   Sodium 132 (*) 135 - 145 (mEq/L)   Potassium 3.6  3.5 - 5.1 (mEq/L)   Chloride 100  96 - 112 (mEq/L)  CO2 24  19 - 32 (mEq/L)   Glucose, Bld 82  70 - 99 (mg/dL)   BUN 5 (*) 6 - 23 (mg/dL)   Creatinine, Ser 1.61 (*) 0.50 - 1.10 (mg/dL)   Calcium 8.5  8.4 - 09.6 (mg/dL)   Total Protein 6.2  6.0 - 8.3 (g/dL)   Albumin 3.0 (*) 3.5 - 5.2 (g/dL)   AST 13  0 - 37 (U/L)   ALT 15  0 - 35 (U/L)   Alkaline Phosphatase 41  39 - 117 (U/L)   Total Bilirubin 0.1 (*) 0.3 - 1.2 (mg/dL)   GFR calc non Af Amer >90  >90 (mL/min)   GFR calc Af Amer >90  >90 (mL/min)     Assessment and Plan  Viral Illness  Plan: DC to home RX Phenergan Guaifenesin w/codeine F/U prn  [facility] 10/25/2011, 11:09 AM

## 2011-11-04 ENCOUNTER — Encounter (INDEPENDENT_AMBULATORY_CARE_PROVIDER_SITE_OTHER): Payer: Medicaid Other

## 2011-11-04 DIAGNOSIS — Z331 Pregnant state, incidental: Secondary | ICD-10-CM

## 2011-11-15 ENCOUNTER — Encounter (INDEPENDENT_AMBULATORY_CARE_PROVIDER_SITE_OTHER): Payer: Medicaid Other | Admitting: Registered Nurse

## 2011-11-15 DIAGNOSIS — Z331 Pregnant state, incidental: Secondary | ICD-10-CM

## 2011-11-16 ENCOUNTER — Encounter (HOSPITAL_COMMUNITY): Payer: Self-pay | Admitting: Obstetrics and Gynecology

## 2011-11-29 ENCOUNTER — Other Ambulatory Visit: Payer: Medicaid Other

## 2011-11-29 ENCOUNTER — Encounter (INDEPENDENT_AMBULATORY_CARE_PROVIDER_SITE_OTHER): Payer: Medicaid Other | Admitting: Obstetrics and Gynecology

## 2011-11-29 DIAGNOSIS — Z331 Pregnant state, incidental: Secondary | ICD-10-CM

## 2011-12-10 DIAGNOSIS — Z789 Other specified health status: Secondary | ICD-10-CM | POA: Insufficient documentation

## 2011-12-14 ENCOUNTER — Ambulatory Visit (INDEPENDENT_AMBULATORY_CARE_PROVIDER_SITE_OTHER): Payer: Medicaid Other | Admitting: Registered Nurse

## 2011-12-14 ENCOUNTER — Encounter: Payer: Self-pay | Admitting: Registered Nurse

## 2011-12-14 VITALS — BP 120/72 | Wt 186.0 lb

## 2011-12-14 DIAGNOSIS — Z3480 Encounter for supervision of other normal pregnancy, unspecified trimester: Secondary | ICD-10-CM

## 2011-12-14 NOTE — Progress Notes (Signed)
C/o difficulty sleeping. Discussed sleeping hygiene. Also experiencing more episodes of vomiting. Advised anti-emetics prn. Doing well. Denies other complaints. ROB x 2 weeks. Rev'd 1 hour glucola results

## 2011-12-21 ENCOUNTER — Inpatient Hospital Stay (HOSPITAL_COMMUNITY)
Admission: AD | Admit: 2011-12-21 | Discharge: 2011-12-21 | Disposition: A | Discharge status: Home / Self Care | Payer: Medicaid Other | Source: Ambulatory Visit | Attending: Obstetrics and Gynecology | Admitting: Obstetrics and Gynecology

## 2011-12-21 ENCOUNTER — Encounter (HOSPITAL_COMMUNITY): Payer: Self-pay | Admitting: *Deleted

## 2011-12-21 DIAGNOSIS — Z331 Pregnant state, incidental: Secondary | ICD-10-CM

## 2011-12-21 DIAGNOSIS — K529 Noninfective gastroenteritis and colitis, unspecified: Secondary | ICD-10-CM | POA: Diagnosis present

## 2011-12-21 DIAGNOSIS — R197 Diarrhea, unspecified: Secondary | ICD-10-CM | POA: Insufficient documentation

## 2011-12-21 DIAGNOSIS — O212 Late vomiting of pregnancy: Secondary | ICD-10-CM | POA: Insufficient documentation

## 2011-12-21 DIAGNOSIS — R112 Nausea with vomiting, unspecified: Secondary | ICD-10-CM

## 2011-12-21 LAB — URINE MICROSCOPIC-ADD ON

## 2011-12-21 LAB — URINALYSIS, ROUTINE W REFLEX MICROSCOPIC
Glucose, UA: NEGATIVE mg/dL
Hgb urine dipstick: NEGATIVE
Ketones, ur: >80 mg/dL — AB
Protein, ur: NEGATIVE mg/dL

## 2011-12-21 LAB — URINALYSIS, DIPSTICK ONLY
Glucose, UA: 500 mg/dL — AB
Hgb urine dipstick: NEGATIVE
Specific Gravity, Urine: 1.01 (ref 1.005–1.030)
pH: 6 (ref 5.0–8.0)

## 2011-12-21 MED ORDER — BUTALBITAL-APAP-CAFFEINE 50-325-40 MG PO TABS: 1.0000 | ORAL_TABLET | Freq: Four times a day (QID) | ORAL | Status: DC | PRN

## 2011-12-21 MED ORDER — DEXTROSE IN LACTATED RINGERS 5 % IV SOLN
INTRAVENOUS | Status: AC
  Administered 2011-12-21: 12:00:00 via INTRAVENOUS

## 2011-12-21 MED ORDER — ONDANSETRON HCL 4 MG/2ML IJ SOLN
4.0000 mg | Freq: Four times a day (QID) | INTRAMUSCULAR | Status: DC
  Administered 2011-12-21: 4 mg via INTRAVENOUS
  Filled 2011-12-21: qty 2

## 2011-12-21 MED ORDER — PROMETHAZINE HCL 25 MG PO TABS: 25.0000 mg | ORAL_TABLET | Freq: Four times a day (QID) | ORAL | Status: DC | PRN

## 2011-12-21 NOTE — MAU Provider Note (Signed)
Has received 2 bags IVF and Zofran. No nausea or diarrhea, no vomiting since arrival. Feels much better--ready to go home. Tolerating po fluids without difficulty.  D/C home with refill Rx for Phenergan po and Fioricet for migraines (used in past for headaches when at [facility]). Precautions reviewed. Keep scheduled appointment at [facility] on 12/30/11 or call prn.  Nigel Bridgeman, CNM, [place] 12/21/11 4p

## 2011-12-21 NOTE — MAU Note (Signed)
Patient states she started having nausea, vomiting and diarrhea last night. Has vomited once this am and last diarrhea at 0400. Has been having back pain on the left side of the mid back but has gotten worse since yesterday. Denied any abdominal pain or bleeding. Reports good fetal movement.

## 2011-12-21 NOTE — Progress Notes (Signed)
Presents denies uc since , srom, vag bleeding, with N,V,D that started yesterday morning, last diarrhea at 0400,  +fetal movement, no problems with pg. Denies cough or cold. With headache only this am denies visual spots or blurring, abd pain, swelling to hands, face, ankles  Problem list: obesity, transfer of care  Allergies: MLA  Meds:PNV  Exam:Calm, no distress, lungs clear bilaterally, AP RRR, abd soft, gravid, nt, bowel sounds active noedema to lower extremities.   Fhts category 1 Contractions few resolved Vag not assessed UA +80 ketones   Assessment 29 4/7 week IUP N,V,D ketonuria  Plan IV hydration, zofran, clear liquid diet. Collaboration with Dr. Stefano Gaul per telephone at [phone]. Pamela Klein, CNM

## 2011-12-21 NOTE — Discharge Instructions (Signed)

## 2011-12-30 ENCOUNTER — Ambulatory Visit (INDEPENDENT_AMBULATORY_CARE_PROVIDER_SITE_OTHER): Payer: Medicaid Other | Admitting: Obstetrics and Gynecology

## 2011-12-30 ENCOUNTER — Encounter: Payer: Self-pay | Admitting: Obstetrics and Gynecology

## 2011-12-30 VITALS — BP 112/70 | Wt 182.0 lb

## 2011-12-30 DIAGNOSIS — Z3400 Encounter for supervision of normal first pregnancy, unspecified trimester: Secondary | ICD-10-CM

## 2011-12-30 NOTE — Progress Notes (Signed)
C/o low back pain constant.  No other problems. FKCs reviewed RTO 2wks

## 2012-01-11 ENCOUNTER — Encounter: Payer: Self-pay | Admitting: Obstetrics and Gynecology

## 2012-01-11 ENCOUNTER — Ambulatory Visit (INDEPENDENT_AMBULATORY_CARE_PROVIDER_SITE_OTHER): Payer: Medicaid Other | Admitting: Obstetrics and Gynecology

## 2012-01-11 VITALS — BP 124/70 | Wt 183.0 lb

## 2012-01-11 DIAGNOSIS — Z3490 Encounter for supervision of normal pregnancy, unspecified, unspecified trimester: Secondary | ICD-10-CM

## 2012-01-11 NOTE — Patient Instructions (Signed)
Information about Mirena

## 2012-01-11 NOTE — Progress Notes (Signed)
No complaints

## 2012-01-18 ENCOUNTER — Telehealth: Payer: Self-pay

## 2012-01-18 NOTE — Telephone Encounter (Signed)
Pt called this am with symptoms of vomiting on last pm but was able to keep a sandwich down this am.Pt also complained of vaginal, pelvic and low back pain with small amount of brown discharge. Pt was advised per Corrie Dandy to rest, increase fluids and to take Phenergan as prescribed. Pt was also advised to return call to office if symptoms worsen or do not subside. Mathis Bud

## 2012-01-28 ENCOUNTER — Ambulatory Visit (INDEPENDENT_AMBULATORY_CARE_PROVIDER_SITE_OTHER): Payer: Medicaid Other | Admitting: Obstetrics and Gynecology

## 2012-01-28 ENCOUNTER — Encounter: Payer: Self-pay | Admitting: Obstetrics and Gynecology

## 2012-01-28 VITALS — BP 120/70 | Wt 185.5 lb

## 2012-01-28 DIAGNOSIS — Z3400 Encounter for supervision of normal first pregnancy, unspecified trimester: Secondary | ICD-10-CM

## 2012-01-28 DIAGNOSIS — B951 Streptococcus, group B, as the cause of diseases classified elsewhere: Secondary | ICD-10-CM

## 2012-01-28 NOTE — Patient Instructions (Signed)

## 2012-01-28 NOTE — Progress Notes (Signed)
Pt is very adamant about getting a another ultrasound C/o pain on sides of abdominal area & pelvic pain

## 2012-01-28 NOTE — Progress Notes (Signed)
[redacted]w[redacted]d GBS/GC/CT today VE closed rv'd comfort measures  Labor sx's and FKC rvd RTO 1wk Pt explained would be charged for [place] at this time rv'd previous records on paper - pt did have quad  that was nl

## 2012-01-29 LAB — GC/CHLAMYDIA PROBE AMP, GENITAL
Chlamydia, DNA Probe: NEGATIVE
GC Probe Amp, Genital: NEGATIVE

## 2012-01-30 LAB — STREP B DNA PROBE: GBSP: NEGATIVE

## 2012-02-02 ENCOUNTER — Encounter: Payer: Medicaid Other | Admitting: Obstetrics and Gynecology

## 2012-02-02 ENCOUNTER — Telehealth: Payer: Self-pay | Admitting: Obstetrics and Gynecology

## 2012-02-02 NOTE — Telephone Encounter (Signed)
Triage/epic 

## 2012-02-03 NOTE — Telephone Encounter (Signed)
TC to pt. LM to return call regarding message. 

## 2012-02-04 ENCOUNTER — Ambulatory Visit (INDEPENDENT_AMBULATORY_CARE_PROVIDER_SITE_OTHER): Payer: Medicaid Other

## 2012-02-04 VITALS — BP 118/70 | Wt 185.0 lb

## 2012-02-04 DIAGNOSIS — Z3400 Encounter for supervision of normal first pregnancy, unspecified trimester: Secondary | ICD-10-CM

## 2012-02-04 NOTE — Progress Notes (Signed)
Yellowish discharge Wants cx checked

## 2012-02-04 NOTE — Progress Notes (Signed)
36w.  Birth plan rev'd and copy made for scan.  Rev'd labor s/s and FKC. Cx closed but softening.  Desires natural CB; not bringing a tub but wants to labor in tub in L&D room. GBS & cx's negative.  Has taken CBE classes.

## 2012-02-04 NOTE — Patient Instructions (Signed)
Normal Labor and Delivery Your caregiver must first be sure you are in labor. Signs of labor include:  You may pass what is called "the mucus plug" before labor begins. This is a small amount of blood stained mucus.   Regular uterine contractions.   The time between contractions get closer together.   The discomfort and pain gradually gets more intense.   Pains are mostly located in the back.   Pains get worse when walking.   The cervix (the opening of the uterus becomes thinner (begins to efface) and opens up (dilates).  Once you are in labor and admitted into the hospital or care center, your caregiver will do the following:  A complete physical examination.   Check your vital signs (blood pressure, pulse, temperature and the fetal heart rate).   Do a vaginal examination (using a sterile glove and lubricant) to determine:   The position (presentation) of the baby (head [vertex] or buttock first).   The level (station) of the baby's head in the birth canal.   The effacement and dilatation of the cervix.   You may have your pubic hair shaved and be given an enema depending on your caregiver and the circumstance.   An electronic monitor is usually placed on your abdomen. The monitor follows the length and intensity of the contractions, as well as the baby's heart rate.   Usually, your caregiver will insert an IV in your arm with a bottle of sugar water. This is done as a precaution so that medications can be given to you quickly during labor or delivery.  NORMAL LABOR AND DELIVERY IS DIVIDED UP INTO 3 STAGES: First Stage This is when regular contractions begin and the cervix begins to efface and dilate. This stage can last from 3 to 15 hours. The end of the first stage is when the cervix is 100% effaced and 10 centimeters dilated. Pain medications may be given by   Injection (morphine, demerol, etc.)   Regional anesthesia (spinal, caudal or epidural, anesthetics given in  different locations of the spine). Paracervical pain medication may be given, which is an injection of and anesthetic on each side of the cervix.  A pregnant woman may request to have "Natural Childbirth" which is not to have any medications or anesthesia during her labor and delivery. Second Stage This is when the baby comes down through the birth canal (vagina) and is born. This can take 1 to 4 hours. As the baby's head comes down through the birth canal, you may feel like you are going to have a bowel movement. You will get the urge to bear down and push until the baby is delivered. As the baby's head is being delivered, the caregiver will decide if an episiotomy (a cut in the perineum and vagina area) is needed to prevent tearing of the tissue in this area. The episiotomy is sewn up after the delivery of the baby and placenta. Sometimes a mask with nitrous oxide is given for the mother to breath during the delivery of the baby to help if there is too much pain. The end of Stage 2 is when the baby is fully delivered. Then when the umbilical cord stops pulsating it is clamped and cut. Third Stage The third stage begins after the baby is completely delivered and ends after the placenta (afterbirth) is delivered. This usually takes 5 to 30 minutes. After the placenta is delivered, a medication is given either by intravenous or injection to help contract   the uterus and prevent bleeding. The third stage is not painful and pain medication is usually not necessary. If an episiotomy was done, it is repaired at this time. After the delivery, the mother is watched and monitored closely for 1 to 2 hours to make sure there is no postpartum bleeding (hemorrhage). If there is a lot of bleeding, medication is given to contract the uterus and stop the bleeding. Document Released: 06/01/2008 Document Revised: 08/12/2011 Document Reviewed: 06/01/2008 [employer] Patient Information 2012 [employer]. 

## 2012-02-05 DIAGNOSIS — Z3400 Encounter for supervision of normal first pregnancy, unspecified trimester: Secondary | ICD-10-CM | POA: Insufficient documentation

## 2012-02-11 ENCOUNTER — Ambulatory Visit (INDEPENDENT_AMBULATORY_CARE_PROVIDER_SITE_OTHER): Payer: Medicaid Other | Admitting: Obstetrics and Gynecology

## 2012-02-11 VITALS — BP 138/82 | Wt 187.0 lb

## 2012-02-11 DIAGNOSIS — Z331 Pregnant state, incidental: Secondary | ICD-10-CM

## 2012-02-11 NOTE — Progress Notes (Signed)
Pt states this morning she noticed brown discharge. Pt states she is unsure if 03-03-12 is her due date. Pt desires cervix check Pt used the bathroom before office visit and stated she will try to leave a sample before she leaves. Repeat BP 126/76 LC CMA

## 2012-02-11 NOTE — Progress Notes (Signed)
Doing well.  No headaches, blurred vision, or right upper quadrant tenderness.  Abdomen nontender.  Blood pressure repeated.  Reflexes normal.  Return office one week.

## 2012-02-14 ENCOUNTER — Encounter (HOSPITAL_COMMUNITY): Payer: Self-pay | Admitting: *Deleted

## 2012-02-14 ENCOUNTER — Inpatient Hospital Stay (HOSPITAL_COMMUNITY)
Admission: AD | Admit: 2012-02-14 | Discharge: 2012-02-14 | Disposition: A | Discharge status: Home / Self Care | Payer: Medicaid Other | Source: Ambulatory Visit | Attending: Obstetrics and Gynecology | Admitting: Obstetrics and Gynecology

## 2012-02-14 ENCOUNTER — Inpatient Hospital Stay (HOSPITAL_COMMUNITY): Payer: Medicaid Other

## 2012-02-14 DIAGNOSIS — O368390 Maternal care for abnormalities of the fetal heart rate or rhythm, unspecified trimester, not applicable or unspecified: Secondary | ICD-10-CM | POA: Insufficient documentation

## 2012-02-14 DIAGNOSIS — K529 Noninfective gastroenteritis and colitis, unspecified: Secondary | ICD-10-CM

## 2012-02-14 DIAGNOSIS — O479 False labor, unspecified: Secondary | ICD-10-CM | POA: Insufficient documentation

## 2012-02-14 DIAGNOSIS — IMO0002 Reserved for concepts with insufficient information to code with codable children: Secondary | ICD-10-CM | POA: Diagnosis present

## 2012-02-14 DIAGNOSIS — Z3400 Encounter for supervision of normal first pregnancy, unspecified trimester: Secondary | ICD-10-CM

## 2012-02-14 NOTE — Discharge Instructions (Signed)
Intrauterine Growth Restriction  Intrauterine growth restriction (IUGR) means that the baby is smaller than normal at the time of the pregnancy or at birth.   Sometime, medical problems with the mother can cause IUGR:  High blood pressure.   Kidney, lung or heart disease.   Diabetes with arteriosclerosis.   Hemoglobinoathies- blood diseases.   Antiphospholipid antibody syndrome - a disorder of the immune system.  Other causes:  Smoking, drug abuse and excessive alcohol drinking.   Diseases of the placenta.   Having twins or more.   Malnutrition.   Infections.   Genetic problems.   Pregnant women 52 years old or younger and pregnant women 11 years old or older.   Exposure to toxic chemicals.  SYMPTOMS  Smaller than normal uterus when measuring the uterine size on the abdomen.   Ultrasound measurements of the fetuses head circumference, the abdominal circumference, the diameter of the biparietal area (sides) of the head and the length of the femur less than normal indicating IUGR.  RISKS AND COMPLICATIONS  Fetal death in the uterus or a stillborn baby.   Not having enough fluid in the baby's sac (oligohydramnios).   Fetal heart rate problems. This leads to more Cesarean Section deliveries.   Low Apgar scores (evaluates the baby's condition at birth).   Increase in the acidity of the baby's blood (acidosis).  TREATMENT   Close following and monitoring of the fetus during the pregnancy.   Treat infections that may be present.   Treat and control the medical disease present.   Look at the condition of the fetus with non-tress tests, contraction stress tests and biophysical profile of the fetus.   Doppler ultrasound (measure the umbilical artery blood flow) lowers the risk of fetal death and stillborn by delivering the baby early if it is abnormal.  HOME CARE INSTRUCTIONS   Follow your caregiver's advice, instructions and keep all of your prenatal appointments.     Get plenty of rest and sleep.   Eat a balanced diet and take all your vitamin and mineral supplements.   Do not over use your energy with hard exercise, work and household activities.   Do not exercise unless your caregiver says it is OK to do so.   Do not smoke, drink alcohol or take illegal drugs.   Avoid chemicals like pesticides.  SEEK MEDICAL CARE IF:   You develop a temperature of 100 F (37.8 C) or higher.   You do not feel the baby moving as much or not at all.   You develop leaking of fluid from the vagina.   You develop vaginal bleeding.   You develop abdominal pain.   You develop uterine contractions.  Document Released: 06/01/2008 Document Revised: 08/12/2011 Document Reviewed: 06/01/2008 [facility] Patient Information 2012 [place], [place].

## 2012-02-14 NOTE — MAU Note (Signed)
Assisted from lobby to rm via wc.

## 2012-02-14 NOTE — MAU Provider Note (Signed)
History   21 yo G1P0 at 36 3/7 weeks presented unannounced c/o UCs this am--was at school, vomited breakfast, had increased UCs, and presented here.  Had to be assisted out of the car by staff.  Denies leaking or bleeding, reports +FM.  Cervix was 1 cm on last exam, 30%.  Pregnancy remarkable for: Patient Active Problem List  Diagnoses  . Spotting in early pregnancy  . Nausea and vomiting of pregnancy, antepartum  . Rubella non-immune status  . Gastroenteritis or enteritis  . Pregnancy, first     Chief Complaint  Patient presents with  . Labor Eval     OB History    Grav Para Term Preterm Abortions TAB SAB Ect Mult Living   1 0 0 0 0 0 0 0 0 0       Past Medical History  Diagnosis Date  . Chronic back pain   . Chlamydia   . H/O candidiasis   . H/O varicella     Past Surgical History  Procedure Date  . No past surgeries     Family History  Problem Relation Age of Onset  . Hypertension Father   . Diabetes Paternal Grandmother     History  Substance Use Topics  . Smoking status: Former Games developer  . Smokeless tobacco: Never Used  . Alcohol Use: No    Allergies: No Known Allergies  Prescriptions prior to admission  Medication Sig Dispense Refill  . Prenatal Vit-Fe Fumarate-FA (PRENATAL MULTIVITAMIN) TABS Take 1 tablet by mouth daily.      . promethazine (PHENERGAN) 25 MG tablet Take 1 tablet (25 mg total) by mouth every 6 (six) hours as needed. Takes for nausea  36 tablet  3     Physical Exam   Blood pressure 127/64, pulse 85, temperature 97.1 F (36.2 C), temperature source Oral, resp. rate 22, height 5\' 4"  (1.626 m), weight 187 lb (84.823 kg), last menstrual period 05/28/2011.  Crying with contractions, but palpate as mild  Chest clear Heart RRR without murmur Abd gravid, NT Pelvic--Cervix posterior, 1 cm, 50%, vtx -2 (vtx verified by BS [place]) Ext WNL  FHR non-reactive at present. UCs irregular, mild, mild irritability. Fern negative, no evidence  SROM.  ED Course  IUP at 37 3/7 weeks Early vs prodromal labor Non-reactive NST  Plan: Observe at present BPP if remains non-reactive.  Nigel Bridgeman, CNM, [place] 02/14/12 1200   Addendum: Returned from [place]:  Vtx, EFW 4+13, 2185gm, < 10%ile.  AFI 18.62, 72%ile.  Dopplers WNL  FHR had segments of reactivity during MAU eval. Minimal contractions now.  Consulted with Dr. Stefano Gaul. Plan d/c home, with FKCs. Schedule repeat US for BPP, dopplers, AFI on Monday, 02/21/12, with ROB visit.  Nigel Bridgeman, CNM, [place] 02/14/12 3:20pm

## 2012-02-15 ENCOUNTER — Telehealth: Payer: Self-pay | Admitting: Obstetrics and Gynecology

## 2012-02-15 ENCOUNTER — Other Ambulatory Visit: Payer: Self-pay | Admitting: Obstetrics and Gynecology

## 2012-02-15 DIAGNOSIS — IMO0002 Reserved for concepts with insufficient information to code with codable children: Secondary | ICD-10-CM

## 2012-02-15 NOTE — Telephone Encounter (Signed)
Message copied by Mason Jim on Tue Feb 15, 2012 11:57 AM ------      Message from: Cornelius Moras      Created: Mon Feb 14, 2012  3:13 PM      Regarding: Patient needs appointment       Just diagnosed with IUGR vs SGA--measuring < 10%ile at 37 3/7 weeks.  Cervix is unfavorable.            Needs appointment on Monday, 02/21/12, for [place] for BPP, AFI, dopplers, with provider visit to follow.            Please call the patient to schedule.            Thanks!            VL

## 2012-02-15 NOTE — Telephone Encounter (Signed)
TC to pt.   Informed of appt 02/21/12.  +FM  To call with any concerns.

## 2012-02-21 ENCOUNTER — Ambulatory Visit (INDEPENDENT_AMBULATORY_CARE_PROVIDER_SITE_OTHER): Payer: Medicaid Other | Admitting: Obstetrics and Gynecology

## 2012-02-21 ENCOUNTER — Ambulatory Visit (INDEPENDENT_AMBULATORY_CARE_PROVIDER_SITE_OTHER): Payer: Medicaid Other

## 2012-02-21 ENCOUNTER — Other Ambulatory Visit: Payer: Self-pay | Admitting: Obstetrics and Gynecology

## 2012-02-21 ENCOUNTER — Encounter: Payer: Self-pay | Admitting: Obstetrics and Gynecology

## 2012-02-21 VITALS — BP 138/60 | Wt 188.0 lb

## 2012-02-21 DIAGNOSIS — O365990 Maternal care for other known or suspected poor fetal growth, unspecified trimester, not applicable or unspecified: Secondary | ICD-10-CM

## 2012-02-21 DIAGNOSIS — IMO0002 Reserved for concepts with insufficient information to code with codable children: Secondary | ICD-10-CM

## 2012-02-21 NOTE — Progress Notes (Signed)
Doing well.   [place] today = normal AFI, BPP 8/8, normal dopplers Repeat US in 1 week for growth, AFI, BPP, dopplers. Reports movement normal now.  Occasional mild contractions. Cervix 1 cm, posterior, thick, vtx, -1--membranes swept a bit.

## 2012-02-21 NOTE — Progress Notes (Signed)
C/O of decreased fetal movement & heavy contractions Desires to have cx checked & membranes sweeped

## 2012-02-27 ENCOUNTER — Inpatient Hospital Stay (HOSPITAL_COMMUNITY)
Admission: AD | Admit: 2012-02-27 | Discharge: 2012-02-29 | DRG: 775 | Disposition: A | Discharge status: Home / Self Care | Payer: Medicaid Other | Source: Ambulatory Visit | Attending: Obstetrics and Gynecology | Admitting: Obstetrics and Gynecology

## 2012-02-27 ENCOUNTER — Telehealth: Payer: Self-pay | Admitting: Obstetrics and Gynecology

## 2012-02-27 ENCOUNTER — Encounter (HOSPITAL_COMMUNITY): Payer: Self-pay | Admitting: *Deleted

## 2012-02-27 DIAGNOSIS — O365990 Maternal care for other known or suspected poor fetal growth, unspecified trimester, not applicable or unspecified: Secondary | ICD-10-CM | POA: Diagnosis present

## 2012-02-27 DIAGNOSIS — Z3400 Encounter for supervision of normal first pregnancy, unspecified trimester: Secondary | ICD-10-CM

## 2012-02-27 DIAGNOSIS — Z789 Other specified health status: Secondary | ICD-10-CM | POA: Diagnosis present

## 2012-02-27 DIAGNOSIS — IMO0002 Reserved for concepts with insufficient information to code with codable children: Secondary | ICD-10-CM | POA: Diagnosis present

## 2012-02-27 LAB — CBC
MCV: 86.8 fL (ref 78.0–100.0)
Platelets: 182 10*3/uL (ref 150–400)
RBC: 4.63 MIL/uL (ref 3.87–5.11)
RDW: 14.2 % (ref 11.5–15.5)
WBC: 7.9 10*3/uL (ref 4.0–10.5)

## 2012-02-27 MED ORDER — OXYCODONE-ACETAMINOPHEN 5-325 MG PO TABS: 1.0000 | ORAL_TABLET | ORAL | Status: DC | PRN

## 2012-02-27 MED ORDER — FENTANYL CITRATE 0.05 MG/ML IJ SOLN
100.0000 ug | INTRAMUSCULAR | Status: DC | PRN
  Administered 2012-02-27 (×2): 100 ug via INTRAVENOUS
  Filled 2012-02-27 (×2): qty 2

## 2012-02-27 MED ORDER — LACTATED RINGERS IV SOLN: 500.0000 mL | INTRAVENOUS | Status: DC | PRN

## 2012-02-27 MED ORDER — OXYTOCIN BOLUS FROM INFUSION
250.0000 mL | Freq: Once | INTRAVENOUS | Status: AC
  Administered 2012-02-27: 250 mL via INTRAVENOUS
  Filled 2012-02-27: qty 500

## 2012-02-27 MED ORDER — CITRIC ACID-SODIUM CITRATE 334-500 MG/5ML PO SOLN: 30.0000 mL | ORAL | Status: DC | PRN

## 2012-02-27 MED ORDER — OXYTOCIN 10 UNIT/ML IJ SOLN: 10.0000 [IU] | Freq: Once | INTRAMUSCULAR | Status: DC

## 2012-02-27 MED ORDER — OXYTOCIN 40 UNITS IN LACTATED RINGERS INFUSION - SIMPLE MED
62.5000 mL/h | Freq: Once | INTRAVENOUS | Status: AC
  Administered 2012-02-27: 2.5 [IU]/h via INTRAVENOUS
  Filled 2012-02-27: qty 1000

## 2012-02-27 MED ORDER — IBUPROFEN 600 MG PO TABS: 600.0000 mg | ORAL_TABLET | Freq: Four times a day (QID) | ORAL | Status: DC | PRN

## 2012-02-27 MED ORDER — LACTATED RINGERS IV SOLN
INTRAVENOUS | Status: DC
  Administered 2012-02-27: 20:00:00 via INTRAVENOUS

## 2012-02-27 MED ORDER — LIDOCAINE HCL (PF) 1 % IJ SOLN
30.0000 mL | INTRAMUSCULAR | Status: DC | PRN
  Administered 2012-02-27: 30 mL via SUBCUTANEOUS
  Filled 2012-02-27: qty 30

## 2012-02-27 MED ORDER — ONDANSETRON HCL 4 MG/2ML IJ SOLN: 4.0000 mg | Freq: Four times a day (QID) | INTRAMUSCULAR | Status: DC | PRN

## 2012-02-27 MED ORDER — ACETAMINOPHEN 325 MG PO TABS: 650.0000 mg | ORAL_TABLET | ORAL | Status: DC | PRN

## 2012-02-27 NOTE — Telephone Encounter (Signed)
Reviewed s/s uc, srom, vag bleeding, daily fetal kick counts to report, comfort measures. Encouragged 8 water daily and frequent voids. May come to hospital for evaluation when desires. Lavera Guise, CNM

## 2012-02-27 NOTE — Progress Notes (Signed)
Patient ID: Pamela Klein, female   DOB: [birth year], 21 y.o.   MRN: [id number] .Subjective: Sleepy between ctx, grunted/pushed w last ctx Denies HA/N/V/RUQ pain   Objective: BP 164/98  Pulse 84  Temp 97.6 F (36.4 C) (Oral)  Resp 22  Wt 185 lb (83.915 kg)  LMP 05/28/2011   FHT:  FHR: 130 bpm, variability: minimal ,  accelerations:  Abscent,  decelerations:  Absent UC:   regular, every 2 minutes SVE:   Dilation: 9 Effacement (%): 100 Exam by:: s. Jaiyah Beining, cnm  BOW intact, inc bloody show  Assessment / Plan: Spontaneous labor, progressing normally GBS neg Will push w urge, pt declined AROM  PIH labs orders, BP still elevated, will cont to observe tho likely from fast labor   Fetal Wellbeing:  Category II Pain Control:  Fentanyl  Update physician PRN  Malissa Hippo 02/27/2012, 9:40 PM

## 2012-02-27 NOTE — H&P (Signed)
Pamela Klein How is a 21 y.o. female presenting for onset of ctx about 5pm, denies LOF, VB, +FM.  Cervix initially 1cm per M.Krebsbach, CNM, now is 3cm/100. Pt requesting pain medications.   Pregnancy significant for: 1. Rubella non-imm 2. IUGR EFW 4-13 <10% on 6-10, dopplers and BPP normal on 6-18 3. Pt has birth plan, desires low intervention  HPI: pt began [facility] at 10wks at [facility] and had routine care, Anatomy US at 18wks was normal. she tx care to [facility] at 24wks. At around 29wks pt had several episodes of N/V/D, she improved w IVF's and antiemetics. She continued to have routine care until 6-10 she came to [facility] for labor check and FHR was non-reactive, US revealed IUGR at that time, otherwise [place] normal. F/U on 6-18, [place] was normal for BPP, and dopplers. She had neg GBS/GC/CT at 36wks.   Maternal Medical History:  Reason for admission: Reason for admission: contractions.  Contractions: Onset was 1-2 hours ago.   Frequency: regular.   Duration is approximately 60 seconds.   Perceived severity is strong.    Fetal activity: Perceived fetal activity is normal.   Last perceived fetal movement was within the past hour.    Prenatal complications: IUGR.   EFW 4-13 <10% on 02-14-12   Prenatal Complications - Diabetes: none.    OB History    Grav Para Term Preterm Abortions TAB SAB Ect Mult Living   1 0 0 0 0 0 0 0 0 0      Past Medical History  Diagnosis Date  . Chronic back pain   . Chlamydia   . H/O candidiasis   . H/O varicella    Past Surgical History  Procedure Date  . No past surgeries    Family History: family history includes Diabetes in her paternal grandmother and Hypertension in her father. Social History:  reports that she quit smoking about 7 months ago. Her smoking use included Cigarettes. She has never used smokeless tobacco. She reports that she does not drink alcohol or use illicit drugs.   Prenatal Transfer Tool  Maternal Diabetes: No Genetic Screening:  Normal Maternal Ultrasounds/Referrals: Abnormal:  Findings:   IUGR Fetal Ultrasounds or other Referrals:  None Maternal Substance Abuse:  No Significant Maternal Medications:  None Significant Maternal Lab Results:  None Other Comments:  None  Review of Systems  Musculoskeletal: Positive for back pain.       W ctx  All other systems reviewed and are negative.    Dilation: 3 Effacement (%): 100 Exam by:: S Jameila Keeny CNM Blood pressure 147/87, pulse 73, temperature 98.5 F (36.9 C), temperature source Oral, resp. rate 20, weight 185 lb (83.915 kg), last menstrual period 05/28/2011. Maternal Exam:  Uterine Assessment: Contraction strength is moderate.  Contraction duration is 60 seconds. Contraction frequency is regular.   Abdomen: Patient reports no abdominal tenderness. Fundal height is aga.   Estimated fetal weight is 5.   Fetal presentation: vertex vtx confirmed by BSUS  Introitus: Normal vulva. Normal vagina.  Vagina is negative for discharge.  Pelvis: adequate for delivery.   Cervix: Cervix evaluated by digital exam.   3/100/-1 BBOW   Fetal Exam Fetal Monitor Review: Mode: ultrasound.   Baseline rate: 140.  Variability: moderate (6-25 bpm).   Pattern: accelerations present and no decelerations.    Fetal State Assessment: Category I - tracings are normal.     Physical Exam  Nursing note and vitals reviewed. Constitutional: She is oriented to person, place, and time. She appears  well-developed and well-nourished. She appears distressed.       Crying and shouting, grimacing, writhing,  appears to have difficulty coping   HENT:  Head: Normocephalic and atraumatic.  Eyes: Pupils are equal, round, and reactive to light.  Neck: Normal range of motion.  Cardiovascular: Normal rate, regular rhythm and normal heart sounds.   Respiratory: Effort normal and breath sounds normal.  GI: Soft. Bowel sounds are normal.       Gravid, NT  Genitourinary: Vagina normal. No vaginal  discharge found.  Musculoskeletal: Normal range of motion. She exhibits edema.       1+ pedal edema  Neurological: She is alert and oriented to person, place, and time. She has normal reflexes.  Skin: Skin is warm and dry.  Psychiatric: She has a normal mood and affect. Her behavior is normal.    Prenatal labs: ABO, Rh: --/--/A POS (10/18 1220) Antibody:  neg Rubella: non-immune RPR: Nonreactive (11/20 0000)  HBsAg: Negative (10/18 0000)  HIV: Non-reactive (10/18 0000)  GBS: NEGATIVE (05/24 1636)  Quad screen neg 1hr gtt=117, hgb 12.3 RPR NR Sickle cell neg GC/CT neg  Assessment/Plan: IUP at [redacted]w[redacted]d Early/active labor FHR reassuring, tho not reactive, did have 10x10 accel  IUGR GBS neg Rubella non-immune BP slightly elevated, will continue to observe, may add PIH labs  Admit to b.s Dr Pennie Rushing attending CNM care Routine L&D orders Analgesia/anethesia per pt request   [place] Luppino M 02/27/2012, 8:24 PM

## 2012-02-27 NOTE — Progress Notes (Signed)
History   presents with c/o uc that hurt in front, no srom, or vag bleeding, with +FM, no relief with bath at home. DX IUGR with pg: 6/10 EFW <10% AFI 18 2158 Chief Complaint  Patient presents with  . Labor Eval   @SFHPI @  OB History    Grav Para Term Preterm Abortions TAB SAB Ect Mult Living   1 0 0 0 0 0 0 0 0 0       Past Medical History  Diagnosis Date  . Chronic back pain   . Chlamydia   . H/O candidiasis   . H/O varicella     Past Surgical History  Procedure Date  . No past surgeries     Family History  Problem Relation Age of Onset  . Hypertension Father   . Diabetes Paternal Grandmother     History  Substance Use Topics  . Smoking status: Former Smoker    Types: Cigarettes    Quit date: 06/29/2011  . Smokeless tobacco: Never Used  . Alcohol Use: No    Allergies: No Known Allergies  Prescriptions prior to admission  Medication Sig Dispense Refill  . Prenatal Vit-Fe Fumarate-FA (PRENATAL MULTIVITAMIN) TABS Take 1 tablet by mouth daily.      . promethazine (PHENERGAN) 25 MG tablet Take 1 tablet (25 mg total) by mouth every 6 (six) hours as needed. Takes for nausea  36 tablet  3    @ROS @ Physical Exam  Tense with uc, abd soft, gravid, nt on palpation, vtx per leopolds, vag by RN 1 60% intact Trace edema lower legs Blood pressure 147/87, pulse 73, temperature 98.5 F (36.9 C), temperature source Oral, resp. rate 20, weight 185 lb (83.915 kg), last menstrual period 05/28/2011. fhts 130 LTV min uc q 2-3 mild A 39 2/7 Week IUP GBS neg IUGR P initial bp elevated will need repeat discussed slow breathing and relaxation, possibly home with medication. Report to S. Leeann Must, CNM at [phone]. Lavera Guise, CNM

## 2012-02-27 NOTE — MAU Note (Signed)
Pt came to MAU crying and moaning in the lobby saying "I can't do this , the baby [id number] coming".  She states the contractions started 1 1/2 hrs ago.  Denies any bleeding or leaking.

## 2012-02-28 LAB — CBC
HCT: 31.6 % — ABNORMAL LOW (ref 36.0–46.0)
MCH: 29.6 pg (ref 26.0–34.0)
MCV: 87.3 fL (ref 78.0–100.0)
Platelets: 188 10*3/uL (ref 150–400)
RBC: 3.62 MIL/uL — ABNORMAL LOW (ref 3.87–5.11)
WBC: 16.3 10*3/uL — ABNORMAL HIGH (ref 4.0–10.5)

## 2012-02-28 LAB — COMPREHENSIVE METABOLIC PANEL
ALT: 8 U/L (ref 0–35)
AST: 13 U/L (ref 0–37)
Albumin: 2.7 g/dL — ABNORMAL LOW (ref 3.5–5.2)
Alkaline Phosphatase: 100 U/L (ref 39–117)
Calcium: 9.1 mg/dL (ref 8.4–10.5)
Glucose, Bld: 124 mg/dL — ABNORMAL HIGH (ref 70–99)
Potassium: 3.7 mEq/L (ref 3.5–5.1)
Sodium: 133 mEq/L — ABNORMAL LOW (ref 135–145)
Total Protein: 5.8 g/dL — ABNORMAL LOW (ref 6.0–8.3)

## 2012-02-28 MED ORDER — SENNOSIDES-DOCUSATE SODIUM 8.6-50 MG PO TABS
2.0000 | ORAL_TABLET | Freq: Every day | ORAL | Status: DC
  Administered 2012-02-28: 2 via ORAL

## 2012-02-28 MED ORDER — ZOLPIDEM TARTRATE 5 MG PO TABS
5.0000 mg | ORAL_TABLET | Freq: Every evening | ORAL | Status: DC | PRN
  Administered 2012-02-28: 5 mg via ORAL
  Filled 2012-02-28: qty 1

## 2012-02-28 MED ORDER — ONDANSETRON HCL 4 MG PO TABS: 4.0000 mg | ORAL_TABLET | ORAL | Status: DC | PRN

## 2012-02-28 MED ORDER — LANOLIN HYDROUS EX OINT: TOPICAL_OINTMENT | CUTANEOUS | Status: DC | PRN

## 2012-02-28 MED ORDER — SIMETHICONE 80 MG PO CHEW: 80.0000 mg | CHEWABLE_TABLET | ORAL | Status: DC | PRN

## 2012-02-28 MED ORDER — ONDANSETRON HCL 4 MG/2ML IJ SOLN: 4.0000 mg | INTRAMUSCULAR | Status: DC | PRN

## 2012-02-28 MED ORDER — DIBUCAINE 1 % RE OINT: 1.0000 "application " | TOPICAL_OINTMENT | RECTAL | Status: DC | PRN

## 2012-02-28 MED ORDER — PRENATAL MULTIVITAMIN CH
1.0000 | ORAL_TABLET | Freq: Every day | ORAL | Status: DC
  Administered 2012-02-28 – 2012-02-29 (×2): 1 via ORAL
  Filled 2012-02-28 (×2): qty 1

## 2012-02-28 MED ORDER — WITCH HAZEL-GLYCERIN EX PADS: 1.0000 "application " | MEDICATED_PAD | CUTANEOUS | Status: DC | PRN

## 2012-02-28 MED ORDER — DIPHENHYDRAMINE HCL 25 MG PO CAPS: 25.0000 mg | ORAL_CAPSULE | Freq: Four times a day (QID) | ORAL | Status: DC | PRN

## 2012-02-28 MED ORDER — MEASLES, MUMPS & RUBELLA VAC ~~LOC~~ INJ
0.5000 mL | INJECTION | Freq: Once | SUBCUTANEOUS | Status: AC
  Administered 2012-02-29: 0.5 mL via SUBCUTANEOUS
  Filled 2012-02-28 (×2): qty 0.5

## 2012-02-28 MED ORDER — IBUPROFEN 600 MG PO TABS
600.0000 mg | ORAL_TABLET | Freq: Four times a day (QID) | ORAL | Status: DC
  Administered 2012-02-28 – 2012-02-29 (×7): 600 mg via ORAL
  Filled 2012-02-28 (×7): qty 1

## 2012-02-28 MED ORDER — OXYCODONE-ACETAMINOPHEN 5-325 MG PO TABS
1.0000 | ORAL_TABLET | ORAL | Status: DC | PRN
  Administered 2012-02-28 – 2012-02-29 (×5): 1 via ORAL
  Filled 2012-02-28 (×5): qty 1

## 2012-02-28 MED ORDER — TETANUS-DIPHTH-ACELL PERTUSSIS 5-2.5-18.5 LF-MCG/0.5 IM SUSP
0.5000 mL | Freq: Once | INTRAMUSCULAR | Status: AC
  Administered 2012-02-28: 0.5 mL via INTRAMUSCULAR

## 2012-02-28 MED ORDER — BENZOCAINE-MENTHOL 20-0.5 % EX AERO
1.0000 "application " | INHALATION_SPRAY | CUTANEOUS | Status: DC | PRN
  Administered 2012-02-28: 1 via TOPICAL
  Filled 2012-02-28: qty 56

## 2012-02-28 NOTE — Progress Notes (Signed)
Post Partum Day 0--s/p SVB Subjective: no complaints.  Doing well, up ad lib without syncope or dizziness. Did not use any pain medication.  Denies HA, visual symptoms, or epigastric pain.  Undecided regarding birth control.  Breast feeding.  Objective: Blood pressure 133/88, pulse 98, temperature 98.7 F (37.1 C), temperature source Oral, resp. rate 20, weight 185 lb (83.915 kg), last menstrual period 05/28/2011, SpO2 99.00%, unknown if currently breastfeeding.  Filed Vitals:   02/27/12 2301 02/27/12 2350 02/28/12 0050 02/28/12 0450  BP: 147/83 129/79 144/77 133/88  Pulse: 86 86 91 98  Temp:  98.1 F (36.7 C) 97.8 F (36.6 C) 98.7 F (37.1 C)  TempSrc:  Oral Oral Oral  Resp: 20 20 20 20   Weight:      SpO2:  99% 100% 99%     Physical Exam:  General: alert Lochia: appropriate Uterine Fundus: firm Incision: healing well DVT Evaluation: No evidence of DVT seen on physical exam. Negative Homan's sign.   Basename 02/28/12 0603 02/27/12 1956  HGB 10.7* 13.7  HCT 31.6* 40.2    Assessment/Plan: Continue current care. Anticipate d/c 03/01/12.   LOS: 1 day   Nigel Bridgeman 02/28/2012, 8:49 AM

## 2012-02-28 NOTE — Progress Notes (Signed)
UR chart review completed.  

## 2012-02-29 ENCOUNTER — Encounter: Payer: Medicaid Other | Admitting: Obstetrics and Gynecology

## 2012-02-29 ENCOUNTER — Other Ambulatory Visit: Payer: Medicaid Other

## 2012-02-29 MED ORDER — IBUPROFEN 800 MG PO TABS: 800.0000 mg | ORAL_TABLET | Freq: Three times a day (TID) | ORAL | Status: AC | PRN

## 2012-02-29 NOTE — Progress Notes (Signed)
Patient ID: Pamela Klein, female   DOB: [birth year], 21 y.o.   MRN: [id number] Pt still hasn't returned to her room.  Will have RN call me when she returns from NICU.

## 2012-02-29 NOTE — Discharge Instructions (Signed)
Postpartum Care After Vaginal Delivery  After you deliver your baby, you will stay in the hospital for 24 to 72 hours, unless there were problems with the labor or delivery, or you have medical problems. While you are in the hospital, you will receive help and instructions on how to care for yourself and your baby.  Your doctor will order pain medicine, in case you need it. You will have a small amount of bleeding from your vagina and should change your sanitary pad frequently. Wash your hands thoroughly with soap and water for at least 20 seconds after changing pads and using the toilet. Let the nurses know if you begin to pass blood clots or your bleeding increases. Do not flush blood clots down the toilet before having the nurse look at them, to make sure there is no placental tissue with them.  If you had an intravenous (IV), it will be removed within 24 hours, if there are no problems. The first time you get out of bed or take a shower, call the nurse to help you because you may get weak, lightheaded, or even faint. If you are breastfeeding, you may feel painful contractions of your uterus for a couple of weeks. This is normal. The contractions help your uterus get back to normal size. If you are not breastfeeding, wear a supportive bra and handle your breasts as little as possible until your milk has dried up. Hormones should not be given to dry up the breasts, because they can cause blood clots. You will be given your normal diet, unless you have diabetes or other medical problems.   The nurses may put an ice pack on your episiotomy (surgically enlarged opening), if you have one, to reduce the pain and swelling. On rare occasions, you may not be able to urinate and the nurse will need to empty your bladder with a catheter. If you had a postpartum tubal ligation ("tying tubes," female sterilization), it should not make your stay in the hospital longer.  You may have your baby in your room with you as much as  you like, unless you or the baby has a problem. Use the bassinet (basket) for the baby when going to and from the nursery. Do not carry the baby. Do not leave the postpartum area. If the mother is Rh negative (lacks a protein on the red blood cells) and the baby is Rh positive, the mother should get a Rho-gam shot to prevent Rh problems with future pregnancies.  You may be given written instructions for you and your baby, and necessary medicines, when you are discharged from the hospital. Be sure you understand and follow the instructions as advised.  HOME CARE INSTRUCTIONS   · Follow instructions and take the medicines given to you.   · Only take over-the-counter or prescription medicines for pain, discomfort, or fever as directed by your caregiver.   · Do not take aspirin, because it can cause bleeding.   · Increase your activities a little bit every day to build up your strength and endurance.   · Do not drink alcohol, especially if you are breastfeeding or taking pain medicine.   · Take your temperature twice a day and record it.   · You may have a small amount of bleeding or spotting for 2 to 4 weeks. This is normal.   · Do not use tampons or douche. Use sanitary pads.   · Try to have someone stay and help you for a   the baby is sleeping.   If you are breastfeeding, wear a good support bra. If you are not breastfeeding, wear a supportive bra and do not stimulate your nipples.   Eat a healthy, nutritious diet and continue to take your prenatal vitamins.   Do not drive, do any heavy activities, or travel until your caregiver tells you it is okay.   Do not have intercourse until your caregiver gives you permission to do so.   Ask your caregiver when you can begin to exercise and what type of exercises to do.   Call your caregiver if you think you are having a problem from your delivery.   Call your pediatrician if you are having a problem  with the baby.   Schedule your postpartum visit and keep it.  SEEK MEDICAL CARE IF:   You have a temperature of 100 F (37.8 C) or higher.   You have increased vaginal bleeding or are passing clots. Save any clots to show your caregiver.   You have bloody urine or pain when you urinate.   You have a bad smelling vaginal discharge.   You have increasing pain or swelling on your episiotomy.   You develop a severe headache.   You feel depressed.   The episiotomy is separating.   You become dizzy or lightheaded.   You develop a rash.   You have a reaction or problems with your medicine.   You have pain, redness, or swelling at the intravenous site.  SEEK IMMEDIATE MEDICAL CARE IF:   You have chest pain.   You develop shortness of breath.   You pass out.   You develop pain, with or without swelling or redness in your leg.   You develop heavy vaginal bleeding, with or without blood clots.   You develop stomach pain.   You develop a bad smelling vaginal discharge.  MAKE SURE YOU:   Understand these instructions.   Will watch your condition.   Will get help right away if you are not doing well or get worse.  Document Released: 06/20/2007 Document Revised: 08/12/2011 Document Reviewed: 07/02/2009 [facility] Patient Information 2012 [place], [place]. Breastfeeding Challenges Breastfeeding is often the best way to feed your baby. Challenges may discourage you from breastfeeding. But solutions can usually be found to help you. Some babies have conditions that may interfere with or make breastfeeding more difficult. But, in all of the following cases, breastfeeding is still best for a baby's health.  ADVANTAGES OF BREASTFEEDING  Breastfed babies tend to be healthier and less affected by disease.   Breastfed babies may have better brain development and be less likely to be overweight than formulafed babies.   Breastfeeding also benefits the mother. It will give you time to  be close to your baby and helps create a strong bond. It also:   Delays the return of your periods.   Stimulates your uterus to contract back to normal.   Helps you lose some of the weight you gained during pregnancy.   Breastfeeding is also cheaper than formula feeding. It also does not require mixing formula, heating bottles, or washing extra dishes.   Breastfeeding mothers have a lower risk of developing breast cancer.   Breastfeeding should be encouraged in women with gestational diabetes and diabetes type I and type II.  BREASTFEEDING CHALLENGES  Breastfeeding involves taking the time to get to know your baby's patterns and responding to his or her cues. Once breastfeeding is well established, feedings usually become  more regular and more widely spaced. Some mothers do not nurse their babies because they come across problems early on. If at all possible, begin breastfeeding your baby within an hour after delivery. The first milk you produce is called colostrum. It is packed with nutrients and disease-fighting substances. These will help nourish and protect your baby against infections as he or she grows up.   Some babies are unable to breastfeed because of premature birth and small size along with weakness and difficulty sucking. Sometimes with birth defects of the mouth (cleft lip or cleft palate) the mother may be able to pump breastmilk to give to her baby. Some digestive problems (breast milk jaundice, galactosemia) may be reasons not to nurse. See a lactation consultant if you have a breast infection or breast abscess, breast cancer or other cancer, previous surgery or radiation treatment, or inadequate milk supply (uncommon).  SOME MOTHERS ARE ADVISED NOT TO BREASTFEED DUE TO HEALTH PROBLEMS SUCH AS:  Serious illnesses.   Severe malnutrition.   Undergoing radiation therapy.   Taking psychiatric medication.   Active herpes lesions on the breast.   Chickenpox or shingles.    Active, untreated tuberculosis.   HIV (human immunodeficiency virus) infection.   Drug or alcohol addiction.   Undergoing radioactive iodine therapy.   Leukemia human cell Type I or II.  BREASTFEEDING SHOULD NOT BE PAINFUL  It is natural for minor problems to arise in first time breastfeeding. Problems you may have and some solutions are as follows:   Nipple soreness may be caused by:   Improper position of baby.   Improper feeding techniques.   Improper nipple care.   For many women, there is no identified cause. A simple change in your baby's position while feeding may relieve nipple soreness. Some breastfeeding mothers report nipple soreness only during the initial adjustment period.   If there is tenderness at first, it should gradually go away as the days go by. Poor latch-on and positioning are common causes of sore nipples. This is usually because the baby is not getting enough of the areola into his or her mouth, and is sucking mostly on the nipple. The areola is the colored portion around the nipple. In general, nurse early and often. Nurse with the nipple and areola in the baby's mouth, not just the nipple. And feed your baby on demand.   If you have sore nipples and put off feedings because of the pain, this can lead to your breasts becoming overly full. This may lead to plugged milk ducts in the breast followed by engorgement or even infection of the breasts. If your baby is latched on correctly, he or she should be able to nurse as long as needed without causing any pain. If it hurts, take the baby off of your breast and try again. Ask for help if it is still painful for you.   Check the positioning of your baby's body and the way she latches on and sucks. To minimize soreness, your baby's mouth should be open wide with as much of the areola in his or her mouth as possible. You should find that it feels better right away once the baby is positioned correctly.   Do not  delay feedings. Try to relax so your let-down reflex comes easily. You also can hand-express a little milk before beginning the feeding so your baby does not clamp down harder, waiting for the milk to come.   If your nipples are very sore, it  may be helpful to change positions each time you nurse. This puts the pressure on a different part of the nipple.   After nursing, you can also express a few drops of milk and gently rub it on your nipples. Human milk has natural healing properties. Let your nipples air-dry after feeding, or wear a soft-cotton shirt.   Wearing a nipple shield during nursing will not relieve sore nipples. They actually can prolong soreness by making it hard for the baby to learn to nurse without the shield.   Avoid wearing bras or clothes that are too tight and put pressure on your nipples. If you wear a bra, get one that offers good support to your breasts.   Change nursing pads often to avoid trapping in moisture. Only use cotton pads.   Avoid using soap, ointments or other chemicals on your nipples. Make sure to avoid products that must be removed before nursing. Washing with clean water is all that is necessary to keep your nipples and breasts clean.   Try rubbing pure lanolin on your nipples after breastfeeding to soothe the pain.   Making sure you get enough rest, eating healthy foods, and getting enough fluids also can help the healing process. If you have very sore nipples, you can ask your caregiver about using non-aspirin pain relievers.   Another cause of sore nursing is a condition called thrush. This is a fungal infection that can form on your nipples from the milk. Other signs of thrush include itching, flaking and drying skin, tender or pink skin. The infection also can form in the baby's mouth from having contact with your nipples. There it appears as little white spots on the inside of the cheeks, gums, or tongue. It also can appear as a diaper rash on your  baby that will not go away by using regular diaper rash ointments. If you have any of these symptoms or think you have thrush, contact your doctor and your baby's doctor, or a Advertising copywriter. A lactation consultant is a breastfeeding Risk analyst. You can get medication for your nipples and for your baby.   If you still have sore nipples after following the above tips, you may need to see someone who is trained in breastfeeding, like a Advertising copywriter.  ENGORGEMENT Engorgement is a condition after pregnancy, when your breasts feel very hard and painful. You also may have breast swelling, tenderness, warmth, redness, throbbing and flattening of the nipple. Engorgement may cause a low-grade fever. This can be confused with a breast infection. Engorgement is the result of the milk building up. It usually happens during the third to fifth day after birth. This slows circulation. When blood and lymph move through the breasts, fluid from the blood vessels can seep into the breast tissues. All of the following can cause engorgement.  Poor positioning.   Infrequent feedings.   Giving supplementary bottles of water, juice, formula, or breast milk or using a pacifier. All of these cut down on your feeding and may lead to engorgement.   Changing the breastfeeding schedule with decreasing in feeding.   The baby changes the nursing pattern.   Having a baby with a weak suck who is not able to nurse effectively.   Fatigue, stress, or anemia in the mother.   An overabundant milk supply.   Nipple damage.   Breast abnormalities.  Engorgement can lead to plugged ducts or a breast infection. So it is important to try to prevent it before  this happens. If treated properly, engorgement should only last for one to two days.  Minimize engorgement by making sure the baby is latched on and positioned correctly at your breast.   Nurse frequently after birth. Allow the baby to nurse as long as  he/she likes, as long as he/she is latched on well and sucking effectively.   In the early days when your milk is coming in, you should awaken a sleepy baby every 2 to 3 hours to breastfeed. Breastfeeding often on the affected side helps to remove the milk and keeps milk moving freely. This prevents overfilling of the breast.   Avoid additional bottles and pacifiers.   Try hand expressing or pumping a little milk to first soften the breast, areola, and nipple before breastfeeding. Or massage the breast before feeding.   Cold compresses in between feedings can help ease pain and swelling.   If you are returning to work, try to pump your milk on the same schedule your baby was fed.   Eat a well balanced diet and drink plenty of fluids.   Use a well-fitting, supportive bra that is not too tight.   If your engorgement lasts for more than two days even after treating it, contact a Advertising copywriter.   Use a breast pump to keep up with your nursing schedule.   Use a breast pump if your baby is not taking enough milk or you feel you may be getting engorged.  PLUGGED DUCTS AND INFECTION Plugged ducts and breast infection (mastitis) are common sources of sore breasts postpartum. It is common for many women to have a plugged duct in the breast at some point if breastfeeding.   A plugged milk duct feels like a tender, sore, lump in the breast. It is not accompanied by a fever or other symptoms. It happens when a milk duct does not properly drain. Then, pressure builds up behind the plug, and surrounding tissue becomes inflamed. A plugged duct usually only occurs in one breast at a time.   A breast infection (mastitis), on the other hand, is soreness or a lump in the breast that can be accompanied by a fever and/or flu-like symptoms. You may feel run down or very achy. Some women with a breast infection also have nausea and vomiting. You also may have yellowish discharge from the nipple that looks  like colostrum. Or the breasts feel warm or hot to the touch and appear pink or red. A breast infection can occur when other family members have a cold or the flu. Like a plugged duct, it usually only occurs in one breast. It is not always easy to tell the difference between a breast infection and a plugged duct. Both have similar symptoms and can improve within 24 to 48 hours.   Treatment for plugged ducts and breast infections is similar. But some breast infections need to also be treated with an antibiotic.   If mastitis is not treated quickly, it may lead to a breast abscess.   It may help to massage the area, starting behind the sore spot. Use your fingers in a circular motion and massage toward the nipple.   Breastfeed more often on the affected side. This helps loosen the plug, keeps the milk moving freely, and the breast from becoming overly full. Nursing every two hours, both day and night on the affected side first can be helpful.   Getting extra sleep or relaxing with your feet up can help speed healing. Often  a plugged duct or breast infection is the first sign that a mother is doing too much and becoming overly tired.   Wear a well-fitting supportive bra that is not too tight, since this can constrict milk ducts.   If you do not feel better within 24 hours of trying these steps, and you have a fever or your symptoms worsen, call your doctor. You may need an antibiotic. Also, if you have a breast infection in which both breasts look affected, or there is pus or blood in the milk, red streaks near the area, or your symptoms came on severely and suddenly, see your doctor right away.   Even if you need an antibiotic, continuing to breastfeed during treatment is best for both you and your baby. Most antibiotics will not affect your baby through your breast milk.  THRUSH Thrush (yeast) is a fungal infection that can form on your nipples or in your breast because it thrives on milk. The  infection forms from an overgrowth of the candida organism. Candida usually exists in our bodies and is kept at healthy levels by the natural bacteria in our bodies. But, when the natural balance of bacteria is upset, candida can overgrow, causing an infection. Some of the things that can cause thrush include:   Having an overly moist environment on your skin or nipples that are sore or cracked.   Taking antibiotics, birth control pills or steroids.   Having a diet that contains large amounts of sugar or foods with yeast.   Having a chronic illness like HIV infection, diabetes, or anemia.  If you have sore nipples that last more than a few days even after you make sure your baby's latch and positioning is correct, or you suddenly get sore nipples after several weeks of unpainful nursing, you could have thrush. Some other signs of thrush include:   Pink, flaky, shiny, itchy or cracked nipples.   Deep pink and blistered nipples.   You also could have shooting pains deep in the breast during or after feedings, or achy breasts.  The infection also can form in your baby's mouth from having contact with your nipples, and appear as little white spots on the inside of the cheeks, gums, or tongue. It also can appear as a diaper rash (small red dots around a rash) on your baby that will not go away by using regular diaper rash ointments. Many babies with thrush refuse to nurse, or are gassy or cranky.  Solution:   If you or your baby have any of these symptoms, contact your doctor and your baby's doctor so you both can be correctly diagnosed.   You can get medication for your nipples and for your baby. Medication for a mother is usually an ointment for the nipples. Your baby can be given a liquid medication for his/her mouth, and/or an ointment for any diaper rash.   Change disposable nursing pads often. Wash any towels or clothing that come in contact with the yeast in very hot water (above 122 F or  50 C).   Wear a clean bra every day. Wash your hands often, and wash your baby's hands often, especially if he or she sucks on his/her fingers.   Only use cotton pads.   Boil any pacifiers, bottle nipples, or toys your baby puts in his or her mouth once a day for 20 minutes to kill the thrush. After one week of treatment, discard pacifiers and nipples and buy new ones.  Boil daily for 20 minutes all breast pump parts that touch the milk.   Make sure other family members are free of thrush or other fungal infections. If they have symptoms, get them treatment.  NURSING STRIKE A nursing strike is when your baby has been nursing well for months, then suddenly loses interest in breastfeeding and begins to refuse the breast. A nursing strike can mean several things are happening with your baby and that she or he is trying to communicate with you to let you know that something is wrong. Not all babies will react the same to different situations that can cause a nursing strike. Some will continue to breastfeed without a problem, others may just become fussy at the breast, and others will refuse the breast entirely. Some of the major causes of a nursing strike include:  Mouth pain from teething, a fungal infection, or a cold sore.   An ear infection.   Pain from a certain nursing position.   Being upset about a long separation from the mother or a major change in routine.   Being interested in other things around him or her.   A cold or stuffy nose that makes breathing difficult.   Reduced milk supply from supplementing with bottles or overuse of a pacifier.   Responding to the mother's strong reaction if the baby has bitten her.   Being upset about hearing arguing or people talking in a harsh voice with other family members while nursing.   Reacting to stress, over-stimulation, or having been repeatedly put off when wanting to nurse.   If your baby is on a nursing strike, it is normal to  feel frustrated and upset, especially if your baby is unhappy. It is important not to feel guilty or that you have done something wrong. Your breasts also may become uncomfortable as the milk builds up.  Treatment  Try to express your milk manually or with a breast pump on the same schedule as the baby used to breastfeed to avoid engorgement and plugged ducts.   Try another feeding method temporarily to give your baby your milk, such as a cup, dropper, or spoon. Keep track of your baby's wet diapers to make sure he/she is getting enough milk (5 to 6 per day).   Keep offering your breast to the baby. If the baby is frustrated, stop and try again later. Try when the baby is sleeping or very sleepy.   Try various breastfeeding positions.   Focus on the baby with all of your attention and comfort him or her with extra touching and cuddling.   Try nursing while rocking and in a quiet room free of distractions.  INVERTED OR LARGE NIPPLES Some women have nipples that naturally are inverted, or that turn inward instead of protruding, or that are flat and do not protrude. Inverted or flat nipples can sometimes make it harder to breastfeed because your baby can have a harder time latching on. But remember that for breastfeeding to work, your baby has to latch on to both the nipple and the breast, so even inverted nipples can work just fine. Very large nipples can make it hard for the baby to get enough of the areola into his or her mouth to compress the milk ducts and get enough milk.  Know what type of nipples you have before you have your baby, so you can be prepared in case you have a problem getting your baby to latch on correctly.  Talk with a lactation consultant at the hospital or at a breastfeeding clinic for extra help if you have flat, inverted, or very large nipples.   Sometimes a Advertising copywriter can help inverted nipples to be pulled out with a small device before your baby is brought to  your breast.   In many cases, inverted nipples will protrude more as the baby starts to latch on and as time passes. The baby's sucking will help.   Flat nipples cause fewer problems than inverted nipples. Good latch-on and positioning are usually enough to ensure that a baby latched to a flat nipple breastfeeds well.   The latch for babies of mothers with very large nipples will improve with time as the baby grows. In some cases, it might take several weeks to get the baby to latch well. A good milk supply helps insure that her baby will get enough milk.  RETURNING TO WORK More and more women are breastfeeding when they return to work because they believe in the benefits of breastfeeding. You can purchase or rent effective breast pumps and storage containers for your milk. Many employers are willing to set up special rooms for mothers who pump. But others are not as educated about the benefits of breastfeeding. Also, many women are not able to take off as much time as they'd like after having their babies and might have to return to work before breastfeeding is well established. After you have your baby, take as much time off as possible. This will help you get your breastfeeding established well and may reduce the number of months you may need to pump your milk while you are at work.   If you plan to have your baby take a bottle of expressed breast milk while you are at work, you can introduce your baby to a bottle when he or she is around four weeks old. Otherwise, the baby might not accept the bottle later on. Once your baby is comfortable taking a bottle, it is a good idea to have Dad or another family member offer a bottle of pumped breast milk on a regular basis so the baby stays in practice.   Let your employer or Geophysical data processor know that you plan to continue breastfeeding once you return to work. Before you return to work, or even before you have your baby, start talking with your  employer about breastfeeding. Do not be afraid to request a clean and private area where you can pump your milk. If you do not have your own office space, you can ask to use a supervisor's office during certain times. Or you can ask to have a clean, clutter free corner of a storage room. All you need is a chair, a small table, and an outlet if you are using an electric pump. Many electric pumps also can run on batteries and do not require an outlet. You can lock the door and place a small sign on it that asks for some privacy. You can pump your breast milk during lunch or other breaks. You could suggest to your employer that you are willing to make up work time for time spent pumping milk.   After pumping, you can refrigerate your milk, place it in a cooler, or freeze it for the baby to be fed later. Many breast pumps come with carrying cases that have a section to store your milk with ice packs. If you do not have access to a refrigerator, you can leave  it at room temperatures for up to 6 hours.   Many employers are not aware of state laws that state they have to allow you to breastfeed at your job. Under these laws, your employer is required to set up a space for you to breastfeed and/or allow paid/unpaid time for breastfeeding employees. To see if your state has a breastfeeding law for employers, go to https://www.warren.info/ or call us at 1-800-LALECHE (in [place]).  JAUNDICE  Jaundice is a condition that is common in many newborns. It appears as a yellowing of the skin and eyes. It is caused by an excess of bilirubin, a yellow pigment that is a product in the blood. All babies are born with extra red blood cells that undergo a process of being broken down and eliminated from the body. Bilirubin levels in the blood can be high because of:  Higher production of it in a newborn.   Increased ability of the newborn intestine to absorb it.   Limited ability of the newborn liver to handle large  amounts of it.  Many cases of jaundice do not need to be treated. Your baby's doctor will carefully monitor your baby's bilirubin levels. Sometimes infants have to be temporarily separated from their mothers to receive special treatment with phototherapy (aiming lights on the baby). In these cases, breastfeeding is encouraged but supplements may also be given to the baby. American Academy of Pediatrics advises against stopping breastfeeding in jaundiced babies and suggests continuing frequent breastfeeding, even during treatment. If your baby is jaundiced or develops jaundice, it is important to discuss with your baby's caregiver all possible treatment options. Share that you do not want to interrupt nursing if this is at all possible.  REFLUX  It is not unusual for babies to spit up after nursing. Usually, babies can spit up and show no other signs of illness. The spitting up disappears as the baby's digestive system matures. As long as the baby has 6 to 8 wet diapers and at least 2 bowel movements in a 24 hour period (under 76 weeks of age), and your baby is gaining weight (at least 4 ounces a week) you can be assured your baby is getting enough milk.  However, some babies have a condition called gastroesophageal reflux (GER). This happens when the muscle at the opening of the stomach opens at the wrong times, allowing milk and food to come back up into the the tube in the throat (esophagus). Symptoms of GER can include:   Crying as if in discomfort.   Waking up frequently at night.   Problems swallowing.   Frequent red or sore throat.   Signs of asthma, bronchitis, wheezing or problems breathing.   Projectile vomiting.   Arching of the back as if in severe pain.   Slow weight gain.   Gagging or choking.   Frequent hiccupping or burping.   Severe spitting up, or spitting up after every feeding, or hours after eating.   Refusal to eat.  Many healthy babies might have some of these  symptoms and do not have GER. But there are babies who might only have a few of these symptoms and have a severe case of GER. Not all babies with GER spit up or vomit.  Some babies with GER do not have a serious medical problem. But caring for them can be hard since they tend to be very fussy and wake up frequently at night. More severe cases of GER may need to be treated  with medication if the baby, in addition to spitting up, also refuses to nurse, gains weight poorly or is losing weight, or has periods of gagging or choking.   If your baby spits up after every feeding and has any of the other symptoms mentioned above, it is best to see his or her doctor for a correct diagnosis. Other than GER, your baby could have another condition that needs treatment. If there are no other signs of illness, he/she could just be sensitive to a food in your diet or a medication he/she is receiving. If your baby has GER, it is important to try to continue to breastfeed since breast milk still is more easily digested than formula. Try smaller, more frequent feedings, thorough burping, and putting the baby in an upright position during and after feedings.  CLEFT PALATE AND CLEFT LIP  Cleft palate and cleft lip are some of the most common birth defects that happen as a baby is developing in the womb. A cleft, or opening, in either the palate or lip can happen together or separately and both can be corrected through surgery. Both conditions can prevent babies from breastfeeding because a baby cannot form a good seal around the nipple and areola with his or her mouth, or get milk out of the breast well.   Cleft palate can happen on one or both sides of a baby's mouth and be partial or complete. Right after birth, a mother whose baby has a cleft palate can try to breastfeed her baby, and she can start expressing her milk right away to keep up her supply. Even if her baby cannot latch on well to her breast, the baby can be fed  breast milk by cup. In some hospitals, babies with cleft palate are fitted with a mouthpiece called an obturator. This fits into the cleft and seals it for easier feeding. The baby should be able to exclusively breastfeed after surgery.   Cleft lip can happen on one or both sides of a baby's lip. But a mother can try different breastfeeding positions and use her thumb or breast to help fill in the opening left by the lip to form a seal around the breast. With cleft lip repair, breastfeeding may only have to be stopped for a few hours.   If your baby is born with a cleft palate or cleft lip, talk with a lactation consultant in the hospital for assistance as soon as possible. Human milk and early breastfeeding is still best for your baby's health.  TWINS OR MULTIPLES Mothers of twins or multiples might feel overwhelmed with the idea of breastfeeding more than one baby at a time. The benefits of human milk to both these mothers and babies are the same as for all mothers and babies. When breastfeeding twins, your breast milk will increase to the amount the babies will need. You will have to take in more calories and liquids when nursing twins. If the babies are premature and unable to nurse, you can pump your breasts and freeze the milk until the babies are ready to nurse. But mothers of multiples get even more benefits from breastfeeding:  Their uterus contracts, which is helpful because it has stretched even more to hold more than one baby.   Hormones are released that relax the mother, which is helpful with the added stress of caring for more than one baby.   Eight to ten hours per week are saved because there is no need to prepare  formula or bottles and the mother's milk is available right away.   Breastfeeding early and often for a mother of multiples is important to keep up her milk supply. A good latch-on for each baby is important to avoid sore nipples. Many mothers find that it is easier to nurse  the babies together rather than separately, and that it gets easier as the babies get older. There are many breastfeeding holds that make it easier to nurse more than one baby at a time. If you are having multiples, talk with a lactation consultant about more ways you can successfully breastfeed your babies.  BREASTFEEDING DURING PREGNANCY While most mothers who are nursing a toddler stop breastfeeding if they find out they are pregnant, it is an individual choice to decide whether to keep breastfeeding during the pregnancy. It is not unsafe for the unborn child if you continue to breastfeed an older child during this time. But, if you are having some problems in your pregnancy such as uterine pain or bleeding, a history of preterm labor or problems gaining weight during pregnancy, your doctor may advise you to wean. Your child also may decide to wean on his or her own because pregnancy changes the amount and flavor of your milk. Some women also choose to wean at this time because they have nipple soreness caused by pregnancy hormones, are nauseous, tire more easily, or find that their growing stomachs make breastfeeding uncomfortable. You will need more calories when you breastfeed while pregnant. Your milk production usually slows down around the fourth month of pregnancy.  BREASTFEEDING AFTER BREAST SURGERY   If you have had breast surgery, including breast implants, you might be worried about whether you will be able to breastfeed. The most important things that affect whether you can produce enough milk for your baby are how your surgery was done and where your incisions are, and the reasons for your surgery. For example, women who have had incisions in the fold under the breasts are less likely to have problems producing milk than women who have had incisions around or across the areola. Incisions around the areola can cut into milk ducts and nerves, where milk is produced and travels. Women who have had  breast surgery to augment breasts that never fully developed may not have enough glands to produce a full milk supply.   If you had breast surgery and are worried about how it will affect breastfeeding, talk with a Advertising copywriter. If you are planning breast surgery and are worried about how it will affect breastfeeding, talk with your surgeon about ways he or she can preserve as much of the breast tissue and milk ducts as possible.  INDUCING LACTATION  Many mothers who adopt want to breastfeed their babies and can do it successfully with some help. It is successful ? to  of the time it is tried. Many will need to supplement their breast milk with donated breast milk or infant formula. But some adoptive mothers can breastfeed exclusively, especially if they have been pregnant before. Lactation is a hormonal response to a physical action. So the stimulation of the baby nursing causes the body to see a need for and produce milk. The more the baby nurses, the more a woman's body will produce milk.   Beginning a combination of hormone treatment several months before the baby is born should help facilitate lactation in many cases.   One thing you can do to prepare is to pump every 3 hours  around the clock for two to three weeks before your baby arrives. Or you can wait until the baby arrives and starts to nurse. Breast milk can be frozen until you are ready to nurse the baby. A supplemental nursing system (SNS) or a lactation aid can help ensure that your baby gets enough nutrition and that your breasts are stimulated to produce milk at the same time.   If you are an adoptive mother who wants to breastfeed, you should see both a Advertising copywriter and your doctor for help in establishing an initial milk supply.  FOR MORE INFORMATION [employer]: www.llli.org Document Released: 02/14/2006 Document Revised: 08/12/2011 Document Reviewed: 05/08/2008 [facility] Patient Information 2012  [place], [place].  Levonorgestrel intrauterine device (IUD) (MIRENA) What is this medicine? LEVONORGESTREL IUD (LEE voe nor jes trel) is a contraceptive (birth control) device. It is used to prevent pregnancy and to treat heavy bleeding that occurs during your period. It can be used for up to 5 years. This medicine may be used for other purposes; ask your health care provider or pharmacist if you have questions. What should I tell my health care provider before I take this medicine? They need to know if you have any of these conditions: -abnormal Pap smear -cancer of the breast, uterus, or cervix -diabetes -endometritis -genital or pelvic infection now or in the past -have more than one sexual partner or your partner has more than one partner -heart disease -history of an ectopic or tubal pregnancy -immune system problems -IUD in place -liver disease or tumor -problems with blood clots or take blood-thinners -use intravenous drugs -uterus of unusual shape -vaginal bleeding that has not been explained -an unusual or allergic reaction to levonorgestrel, other hormones, silicone, or polyethylene, medicines, foods, dyes, or preservatives -pregnant or trying to get pregnant -breast-feeding How should I use this medicine? This device is placed inside the uterus by a health care professional. Talk to your pediatrician regarding the use of this medicine in children. Special care may be needed. Overdosage: If you think you have taken too much of this medicine contact a poison control center or emergency room at once. NOTE: This medicine is only for you. Do not share this medicine with others. What if I miss a dose? This does not apply. What may interact with this medicine? Do not take this medicine with any of the following medications: -amprenavir -bosentan -fosamprenavir This medicine may also interact with the following medications: -aprepitant -barbiturate medicines for inducing sleep  or treating seizures -bexarotene -griseofulvin -medicines to treat seizures like carbamazepine, ethotoin, felbamate, oxcarbazepine, phenytoin, topiramate -modafinil -pioglitazone -rifabutin -rifampin -rifapentine -some medicines to treat HIV infection like atazanavir, indinavir, lopinavir, nelfinavir, tipranavir, ritonavir -St. John's wort -warfarin This list may not describe all possible interactions. Give your health care provider a list of all the medicines, herbs, non-prescription drugs, or dietary supplements you use. Also tell them if you smoke, drink alcohol, or use illegal drugs. Some items may interact with your medicine. What should I watch for while using this medicine? Visit your doctor or health care professional for regular check ups. See your doctor if you or your partner has sexual contact with others, becomes HIV positive, or gets a sexual transmitted disease. This product does not protect you against HIV infection (AIDS) or other sexually transmitted diseases. You can check the placement of the IUD yourself by reaching up to the top of your vagina with clean fingers to feel the threads. Do not pull on  the threads. It is a good habit to check placement after each menstrual period. Call your doctor right away if you feel more of the IUD than just the threads or if you cannot feel the threads at all. The IUD may come out by itself. You may become pregnant if the device comes out. If you notice that the IUD has come out use a backup birth control method like condoms and call your health care provider. Using tampons will not change the position of the IUD and are okay to use during your period. What side effects may I notice from receiving this medicine? Side effects that you should report to your doctor or health care professional as soon as possible: -allergic reactions like skin rash, itching or hives, swelling of the face, lips, or tongue -fever, flu-like symptoms -genital  sores -high blood pressure -no menstrual period for 6 weeks during use -pain, swelling, warmth in the leg -pelvic pain or tenderness -severe or sudden headache -signs of pregnancy -stomach cramping -sudden shortness of breath -trouble with balance, talking, or walking -unusual vaginal bleeding, discharge -yellowing of the eyes or skin Side effects that usually do not require medical attention (report to your doctor or health care professional if they continue or are bothersome): -acne -breast pain -change in sex drive or performance -changes in weight -cramping, dizziness, or faintness while the device is being inserted -headache -irregular menstrual bleeding within first 3 to 6 months of use -nausea This list may not describe all possible side effects. Call your doctor for medical advice about side effects. You may report side effects to FDA at 1-800-FDA-1088. Where should I keep my medicine? This does not apply. NOTE: This sheet is a summary. It may not cover all possible information. If you have questions about this medicine, talk to your doctor, pharmacist, or health care provider.  2012, Elsevier/Gold Standard. (09/13/2008 6:39:08 PM)

## 2012-02-29 NOTE — Progress Notes (Signed)
Pt not in room currently.  Nurse Tech reports pt's newborn transferred to NICU, so pt is upstairs. Will return to d/c pt after she returns to her room.

## 2012-02-29 NOTE — Discharge Summary (Signed)
Obstetric Discharge Summary Reason for Admission: onset of labor Prenatal Procedures: NST and ultrasound Intrapartum Procedures: spontaneous vaginal delivery Postpartum Procedures: Rubella Ig Complications-Operative and Postpartum: Rt labial; 1st degree (repaired) Hemoglobin  Date Value Range Status  02/28/2012 10.7* 12.0 - 15.0 g/dL Final     REPEATED TO VERIFY     DELTA CHECK NOTED     HCT  Date Value Range Status  02/28/2012 31.6* 36.0 - 46.0 % Final  Hospital course:  Pt presented in early active labor at 39.2 weeks, around 1900 on evening of 02/27/12.  Cx 1/60% with regular ctxs.  Systolic BP elevated but no PIH s/s.  Spontaneous Labor progressed rapidly (no anesthesia) thereafter and pt complete at 2146, and SVD at 2150.  Delivery by Sanda Klein, CNM; CNM attempted to somersault newborn through nuchal cord, and cord avulsed.  NICU present for delivery.  Newborn remained with pt until PPD#1, and then transferred to NICU for feeding difficulty, low sugar, and per pt will remain inpatient for 3-4 more days.  Pt's BP's have been normal PP and had Normal PIH labs.  Pt to obtain pump for home.  No BM since delivery, but voiding well.  Up ad lib & tol PO.  Declines Percocet at d/c and desired "800mg  po Motrin."  Desires Mirena IUD. Some tension noted at time of d/c between pt and s.o. (she was upset that he had already cut and thrown away his baby bracelet).  Physical Exam:  General: alert, cooperative, distracted and no distress Lochia: appropriate Uterine Fundus: firm Incision: n/a. DVT Evaluation: No evidence of DVT seen on physical exam. Negative Homan's sign. Mild pedal & ankle edema; non-pitting  Discharge Diagnoses: Term Pregnancy-delivered and IUGR; Rubella Nonimmune; Lactating and supplementing; Elevated BP on admission,but normotensive PP. MSF.  Cord avulsion at time of delivery w/ Nuchal.  Rapid labor.  Discharge Information: Date: 02/29/2012 PPD#2 Activity: pelvic  rest Diet: routine Medications: PNV and Ibuprofen Condition: stable Instructions: refer to practice specific booklet Discharge to: home Follow-up Information    Follow up with [facility]. Schedule an appointment as soon as possible for a visit in 5 weeks. (or call as needed with any questions or concerns)          Newborn Data: Live born female "Nevaeh" (delivery provider:  Sanda Klein, CNM) Birth Weight: 5 lb 0.4 oz (2279 g) APGAR: 8, 9  Newborn remains in NICU at pt's time of d/c.  Kazoua Gossen H 02/29/2012, 12:14 PM

## 2012-03-06 HISTORY — PX: INTRAUTERINE DEVICE INSERTION: SHX323

## 2012-04-03 ENCOUNTER — Encounter: Payer: Self-pay | Admitting: Obstetrics and Gynecology

## 2012-04-03 ENCOUNTER — Ambulatory Visit (INDEPENDENT_AMBULATORY_CARE_PROVIDER_SITE_OTHER): Payer: Medicaid Other | Admitting: Obstetrics and Gynecology

## 2012-04-03 VITALS — BP 106/74 | Resp 16 | Ht 64.0 in | Wt 174.0 lb

## 2012-04-03 DIAGNOSIS — N898 Other specified noninflammatory disorders of vagina: Secondary | ICD-10-CM

## 2012-04-03 DIAGNOSIS — N912 Amenorrhea, unspecified: Secondary | ICD-10-CM

## 2012-04-03 DIAGNOSIS — Z113 Encounter for screening for infections with a predominantly sexual mode of transmission: Secondary | ICD-10-CM

## 2012-04-03 LAB — POCT WET PREP (WET MOUNT): pH: 5

## 2012-04-03 NOTE — Progress Notes (Addendum)
Date of delivery: 02/27/2012 Female Name: Colletta [place] Vaginal delivery:yes Cesarean section:no Tubal ligation:no GDM:no Breast Feeding:yes Bottle Feeding:no Post-Partum Blues:no Abnormal pap:yes Normal GU function: yes Normal GI function:yes Returning to work:yes on the 16th  Wants to discuss [facility] options.  No complaints.  Filed Vitals:   04/03/12 1545  BP: 106/74  Resp: 16   ROS: noncontributory  Pelvic exam:  VULVA: normal appearing vulva with no masses, tenderness or lesions,  VAGINA: normal appearing vagina with normal color and discharge, no lesions, CERVIX: normal appearing cervix without discharge or lesions,  UTERUS: uterus is normal size, shape, consistency and nontender,  ADNEXA: normal adnexa in size, nontender and no masses.  Results for orders placed in visit on 04/03/12  POCT URINE PREGNANCY      Component Value Range   Preg Test, Ur Negative    POCT WET PREP (WET MOUNT)      Component Value Range   Source Wet Prep POC       WBC, Wet Prep HPF POC none     Bacteria Wet Prep HPF POC none     BACTERIA WET PREP MORPHOLOGY POC       Clue Cells Wet Prep HPF POC None     CLUE CELLS WET PREP WHIFF POC Negative Whiff     Yeast Wet Prep HPF POC None     KOH Wet Prep POC       Trichomonas Wet Prep HPF POC None     pH 5.0       A/P Wants Mirena (R/B/A discussed) RTO with menses for Mirena Pap today will be 21yo in 3days

## 2012-04-05 LAB — PAP IG, CT-NG, RFX HPV ASCU
Chlamydia Probe Amp: NEGATIVE
GC Probe Amp: NEGATIVE

## 2012-04-10 ENCOUNTER — Encounter: Payer: Self-pay | Admitting: Obstetrics and Gynecology

## 2012-04-17 ENCOUNTER — Telehealth: Payer: Self-pay | Admitting: Obstetrics and Gynecology

## 2012-04-17 NOTE — Telephone Encounter (Signed)
TRIAGE/GEN.QUEST. °

## 2012-04-17 NOTE — Telephone Encounter (Signed)
Tc to pt regarding msg, lm on vm to call back. 

## 2012-04-25 ENCOUNTER — Telehealth: Payer: Self-pay | Admitting: Obstetrics and Gynecology

## 2012-04-25 ENCOUNTER — Telehealth: Payer: Self-pay

## 2012-04-25 NOTE — Telephone Encounter (Signed)
Spoke to pt who states she is spotting and wants to schedule IUD insertion. Denny Peon States she has verified benefits and it is ok to sched pt. I will get her scheduled for the next available for IUD insertion. Melody Comas A

## 2012-04-25 NOTE — Telephone Encounter (Signed)
Booked pt appt with [facility] on 05/05/2012. Pamela Klein

## 2012-05-05 ENCOUNTER — Encounter: Payer: Self-pay | Admitting: Obstetrics and Gynecology

## 2012-05-05 ENCOUNTER — Ambulatory Visit (INDEPENDENT_AMBULATORY_CARE_PROVIDER_SITE_OTHER): Payer: Medicaid Other | Admitting: Obstetrics and Gynecology

## 2012-05-05 VITALS — BP 110/60 | Resp 18 | Ht 64.0 in | Wt 177.0 lb

## 2012-05-05 DIAGNOSIS — Z139 Encounter for screening, unspecified: Secondary | ICD-10-CM

## 2012-05-05 DIAGNOSIS — Z309 Encounter for contraceptive management, unspecified: Secondary | ICD-10-CM

## 2012-05-05 DIAGNOSIS — Z30430 Encounter for insertion of intrauterine contraceptive device: Secondary | ICD-10-CM

## 2012-05-05 LAB — POCT URINE PREGNANCY: Preg Test, Ur: NEGATIVE

## 2012-05-05 MED ORDER — LEVONORGESTREL 20 MCG/24HR IU IUD
INTRAUTERINE_SYSTEM | Freq: Once | INTRAUTERINE | Status: AC
  Administered 2012-05-05: 1 via INTRAUTERINE

## 2012-05-05 NOTE — Progress Notes (Signed)
IUD: mirena  LOT#: [id number] Exp: 1/16 UPT: neg GC/CHLAMYDIA: negative CONSENT SIGNED: yes DISINFECTION WITH Betadine X3 UTERUS SOUNDED AT 6.5 CM IUD INSERTED PER PROTOCOL: yes COMPLICATION: none PATIENT INSTRUCTED TO CALL IS FEVER OR ABNORMAL PAIN:yes PATIENT INSTRUCTED ON HOW TO CHECK IUD STRINGS: yes FOLLOW UP APPT: 6 wks    IUD INSERTION NOTE   Consent signed after risks and benefits were reviewed including but not limited to bleeding, infection and risk of uterine perforation.  LMP: Patient's last menstrual period was 04/26/2012. UPT: Negative GC / Chlamydia: negative  MIRENA SERIAL NUMBER: see above  Prepping with Betadine Tenaculum placed on anterior lip of cervix Uterus sounded at  6.5 cm Insertion of MIRENA IUD per protocol without any complications  POST-PROCEDURE:  Patient instructed to call with fever or excessive pain Patient instructed to check IUD strings after each menstrual cycle Patient advised not to have intercourse/or unprotected intercourse in the next 7 days to allow the hormone to setup in her system.   Follow-up: 6  weeks   Sarath Privott, CNM. 05/05/2012 5:54 PM

## 2012-05-18 ENCOUNTER — Telehealth: Payer: Self-pay | Admitting: Obstetrics and Gynecology

## 2012-05-18 NOTE — Telephone Encounter (Signed)
Triage/general quest. 

## 2012-05-18 NOTE — Telephone Encounter (Signed)
Pt was called regarding IUD questions. Pt complains of spotting, vaginal irritation and can't feel IUD strings. Pt was advised that spotting was normal. Pt wanted to come in for assurance of IUD still being in place. Appt will include vaginal itching as well. Mathis Bud

## 2012-05-19 ENCOUNTER — Encounter: Payer: Medicaid Other | Admitting: Obstetrics and Gynecology

## 2012-05-23 ENCOUNTER — Encounter: Payer: Medicaid Other | Admitting: Obstetrics and Gynecology

## 2012-05-23 ENCOUNTER — Telehealth: Payer: Self-pay

## 2012-05-23 NOTE — Telephone Encounter (Signed)
Pt DNKA  appt was scheduled for 05-23-12 @ 4:00pm.  RGYN Vaginal Irritation/Not able to feel IUD strings. Per SL she asked I document pt DNKA.  [facility] CMA

## 2012-05-26 ENCOUNTER — Telehealth: Payer: Self-pay | Admitting: Obstetrics and Gynecology

## 2012-05-26 NOTE — Telephone Encounter (Signed)
Pt called, had her Mirena inserted on 05/05/12, states has been having bldg for 3 straight weeks and some cramping, like normal period cramping.  Has even bled thru some pads and thinks she is having complications from the Mirena.  Pt advised can have some irregular bleeding d/t initial insertion of IUD, can take up to 3-6 months before cycles can become regulated, pt denies any severe abdominal cramping.  Pt advised to continue monitoring, if she starts to change a soaked pad Q hr or less to make sure to call, otherwise continue monitoring, it feels that she still needs an appt, to call office, pt voices understanding.

## 2012-06-26 ENCOUNTER — Ambulatory Visit (INDEPENDENT_AMBULATORY_CARE_PROVIDER_SITE_OTHER): Payer: Medicaid Other

## 2012-06-26 ENCOUNTER — Ambulatory Visit: Payer: Medicaid Other | Admitting: Obstetrics and Gynecology

## 2012-06-26 ENCOUNTER — Encounter: Payer: Self-pay | Admitting: Obstetrics and Gynecology

## 2012-06-26 VITALS — BP 110/68 | Wt 179.0 lb

## 2012-06-26 DIAGNOSIS — N83209 Unspecified ovarian cyst, unspecified side: Secondary | ICD-10-CM

## 2012-06-26 DIAGNOSIS — IMO0001 Reserved for inherently not codable concepts without codable children: Secondary | ICD-10-CM

## 2012-06-26 DIAGNOSIS — Z309 Encounter for contraceptive management, unspecified: Secondary | ICD-10-CM

## 2012-06-26 LAB — POCT URINE PREGNANCY: Preg Test, Ur: NEGATIVE

## 2012-06-26 NOTE — Progress Notes (Unsigned)
Menses 9/30 lighter than usual Calm, no distress,   Normal hair distrubition mons pubis,  EGBUS WNL,  sterile speculum exam, vagina pink, moist normal rugae,  Strings of IUD not visible LTC, no cervical motion tenderness, No adnexal masses or tenderness [place] IUD in place R simple cyst Neg UPT A/P discussed [place]  F/o [place] 2 month re evaluate cyst, annual exam.  Maryln Manuel, CNM

## 2012-06-26 NOTE — Progress Notes (Deleted)
IUD SURVEILLANCE NOTE  MIRENA IUD inserted on 05/05/2012  Pain: none Bleeding: spotting  Patient has checked IUD strings: {yes no:314532} Other complaints: {No/  **:31982}  EXAM:  IUD strings visualized: {yes no:314532}              Pelvic exam normal: {YES NO:22349}  Next visit with annual exam or  Pt stated been having headaches .  06/26/2012 11:03 AM

## 2012-08-18 NOTE — Progress Notes (Unsigned)
Complaints: none  Strings Visualized:{yes no:314532} Normal Bimanual:{yes no:314532} Other: *** Follow up with AEX:{yes no:314532} Other: ***

## 2012-09-18 ENCOUNTER — Telehealth: Payer: Self-pay | Admitting: Obstetrics and Gynecology

## 2012-09-18 NOTE — Telephone Encounter (Signed)
Had to fix some notes

## 2013-04-27 ENCOUNTER — Encounter (HOSPITAL_COMMUNITY): Payer: Self-pay | Admitting: Emergency Medicine

## 2013-04-27 ENCOUNTER — Emergency Department (HOSPITAL_COMMUNITY)
Admission: EM | Admit: 2013-04-27 | Discharge: 2013-04-27 | Disposition: A | Discharge status: Home / Self Care | Payer: Self-pay | Attending: Emergency Medicine | Admitting: Emergency Medicine

## 2013-04-27 ENCOUNTER — Emergency Department (HOSPITAL_COMMUNITY): Payer: Self-pay

## 2013-04-27 DIAGNOSIS — R5381 Other malaise: Secondary | ICD-10-CM | POA: Insufficient documentation

## 2013-04-27 DIAGNOSIS — R599 Enlarged lymph nodes, unspecified: Secondary | ICD-10-CM | POA: Insufficient documentation

## 2013-04-27 DIAGNOSIS — M791 Myalgia, unspecified site: Secondary | ICD-10-CM

## 2013-04-27 DIAGNOSIS — G8929 Other chronic pain: Secondary | ICD-10-CM | POA: Insufficient documentation

## 2013-04-27 DIAGNOSIS — Z8701 Personal history of pneumonia (recurrent): Secondary | ICD-10-CM | POA: Insufficient documentation

## 2013-04-27 DIAGNOSIS — Z3202 Encounter for pregnancy test, result negative: Secondary | ICD-10-CM | POA: Insufficient documentation

## 2013-04-27 DIAGNOSIS — I10 Essential (primary) hypertension: Secondary | ICD-10-CM | POA: Insufficient documentation

## 2013-04-27 DIAGNOSIS — J069 Acute upper respiratory infection, unspecified: Secondary | ICD-10-CM

## 2013-04-27 DIAGNOSIS — J029 Acute pharyngitis, unspecified: Secondary | ICD-10-CM | POA: Insufficient documentation

## 2013-04-27 DIAGNOSIS — J3489 Other specified disorders of nose and nasal sinuses: Secondary | ICD-10-CM | POA: Insufficient documentation

## 2013-04-27 DIAGNOSIS — R112 Nausea with vomiting, unspecified: Secondary | ICD-10-CM | POA: Insufficient documentation

## 2013-04-27 DIAGNOSIS — R059 Cough, unspecified: Secondary | ICD-10-CM | POA: Insufficient documentation

## 2013-04-27 DIAGNOSIS — Z8619 Personal history of other infectious and parasitic diseases: Secondary | ICD-10-CM | POA: Insufficient documentation

## 2013-04-27 DIAGNOSIS — Z87891 Personal history of nicotine dependence: Secondary | ICD-10-CM | POA: Insufficient documentation

## 2013-04-27 DIAGNOSIS — IMO0001 Reserved for inherently not codable concepts without codable children: Secondary | ICD-10-CM | POA: Insufficient documentation

## 2013-04-27 DIAGNOSIS — M2550 Pain in unspecified joint: Secondary | ICD-10-CM | POA: Insufficient documentation

## 2013-04-27 DIAGNOSIS — R05 Cough: Secondary | ICD-10-CM | POA: Insufficient documentation

## 2013-04-27 HISTORY — DX: Essential (primary) hypertension: I10

## 2013-04-27 LAB — URINALYSIS, ROUTINE W REFLEX MICROSCOPIC
Bilirubin Urine: NEGATIVE
Hgb urine dipstick: NEGATIVE
Nitrite: NEGATIVE
Specific Gravity, Urine: 1.03 (ref 1.005–1.030)
Urobilinogen, UA: 1 mg/dL (ref 0.0–1.0)
pH: 7.5 (ref 5.0–8.0)

## 2013-04-27 LAB — COMPREHENSIVE METABOLIC PANEL
ALT: 8 U/L (ref 0–35)
Alkaline Phosphatase: 58 U/L (ref 39–117)
BUN: 14 mg/dL (ref 6–23)
CO2: 22 mEq/L (ref 19–32)
Chloride: 101 mEq/L (ref 96–112)
GFR calc Af Amer: >90 mL/min (ref >90)
Glucose, Bld: 83 mg/dL (ref 70–99)
Potassium: 3.7 mEq/L (ref 3.5–5.1)
Sodium: 137 mEq/L (ref 135–145)
Total Bilirubin: 0.2 mg/dL — ABNORMAL LOW (ref 0.3–1.2)

## 2013-04-27 LAB — CBC WITH DIFFERENTIAL/PLATELET
Hemoglobin: 14 g/dL (ref 12.0–15.0)
Lymphocytes Relative: 10 % — ABNORMAL LOW (ref 12–46)
Lymphs Abs: 1.4 10*3/uL (ref 0.7–4.0)
MCH: 29.9 pg (ref 26.0–34.0)
Monocytes Relative: 8 % (ref 3–12)
Neutro Abs: 11.1 10*3/uL — ABNORMAL HIGH (ref 1.7–7.7)
Neutrophils Relative %: 82 % — ABNORMAL HIGH (ref 43–77)
Platelets: 201 10*3/uL (ref 150–400)
RBC: 4.69 MIL/uL (ref 3.87–5.11)
WBC: 13.6 10*3/uL — ABNORMAL HIGH (ref 4.0–10.5)

## 2013-04-27 LAB — RAPID STREP SCREEN (MED CTR MEBANE ONLY): Streptococcus, Group A Screen (Direct): NEGATIVE

## 2013-04-27 LAB — D-DIMER, QUANTITATIVE: D-Dimer, Quant: <0.27 ug/mL-FEU (ref 0.00–0.48)

## 2013-04-27 LAB — ETHANOL: Alcohol, Ethyl (B): <11 mg/dL (ref 0–11)

## 2013-04-27 LAB — RAPID URINE DRUG SCREEN, HOSP PERFORMED
Barbiturates: NOT DETECTED
Cocaine: NOT DETECTED

## 2013-04-27 LAB — POCT PREGNANCY, URINE: Preg Test, Ur: NEGATIVE

## 2013-04-27 MED ORDER — KETOROLAC TROMETHAMINE 30 MG/ML IJ SOLN
30.0000 mg | Freq: Once | INTRAMUSCULAR | Status: AC
  Administered 2013-04-27: 30 mg via INTRAVENOUS
  Filled 2013-04-27: qty 1
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
## 2013-04-28 NOTE — ED Provider Notes (Signed)
CSN: [id number]     Arrival date & time 04/27/13  2015 History     First MD Initiated Contact with Patient 04/27/13 2018     Chief Complaint  Patient presents with  . Altered Mental Status   (Consider location/radiation/quality/duration/timing/severity/associated sxs/prior Treatment) Patient is a 22 y.o. female presenting with URI. The history is provided by the patient. No language interpreter was used.  URI Presenting symptoms: congestion, cough, fatigue, rhinorrhea and sore throat   Presenting symptoms: no fever   Severity:  Moderate Onset quality:  Gradual Duration:  3 days Timing:  Constant Progression:  Worsening Chronicity:  New Relieved by:  Nothing Worsened by:  Nothing tried Ineffective treatments: nyquil. Associated symptoms: arthralgias, myalgias and swollen glands   Associated symptoms: no headaches, no neck pain, no sinus pain, no sneezing and no wheezing   Myalgias:    Location:  Generalized   Quality:  Aching   Severity:  Severe   Onset quality:  Gradual   Duration:  3 days   Timing:  Constant   Progression:  Unchanged   Past Medical History  Diagnosis Date  . Chronic back pain   . Chlamydia   . H/O candidiasis   . H/O varicella   . Hypertension    Past Surgical History  Procedure Laterality Date  . No past surgeries     Family History  Problem Relation Age of Onset  . Hypertension Father   . Diabetes Paternal Grandmother    History  Substance Use Topics  . Smoking status: Former Smoker    Types: Cigarettes    Quit date: 06/29/2011  . Smokeless tobacco: Never Used  . Alcohol Use: No   OB History   Grav Para Term Preterm Abortions TAB SAB Ect Mult Living   1 1 1  0 0 0 0 0 0 1     Review of Systems  Constitutional: Positive for fatigue. Negative for fever, chills, diaphoresis, activity change and appetite change.  HENT: Positive for congestion, sore throat and rhinorrhea. Negative for facial swelling, sneezing, neck pain and neck  stiffness.   Eyes: Negative for photophobia and discharge.  Respiratory: Positive for cough. Negative for chest tightness, shortness of breath and wheezing.   Cardiovascular: Negative for chest pain, palpitations and leg swelling.  Gastrointestinal: Positive for nausea and vomiting. Negative for abdominal pain and diarrhea.  Endocrine: Negative for polydipsia and polyuria.  Genitourinary: Negative for dysuria, frequency, difficulty urinating and pelvic pain.  Musculoskeletal: Positive for myalgias and arthralgias. Negative for back pain.  Skin: Negative for color change and wound.  Allergic/Immunologic: Negative for immunocompromised state.  Neurological: Negative for facial asymmetry, weakness, numbness and headaches.  Hematological: Does not bruise/bleed easily.  Psychiatric/Behavioral: Negative for confusion and agitation.    Allergies  Review of patient's allergies indicates no known allergies.  Home Medications   Current Outpatient Rx  Name  Route  Sig  Dispense  Refill  . Pseudoephedrine-APAP-DM (DAYQUIL MULTI-SYMPTOM COLD/FLU PO)   Oral   Take 15 mLs by mouth every 6 (six) hours as needed (cold symptoms).          BP 149/71  Pulse 81  Temp(Src) 99.7 F (37.6 C) (Oral)  Resp 16  SpO2 100%  LMP 04/13/2013 Physical Exam  Constitutional: She is oriented to person, place, and time. She appears well-developed and well-nourished. No distress.  Initially refused to answer questions or follow commands, but after sternal rub, pt is awake, alert, appropriate.  HENT:  Head: Normocephalic and atraumatic.  Mouth/Throat: Posterior oropharyngeal erythema present. No oropharyngeal exudate, posterior oropharyngeal edema or tonsillar abscesses.  Eyes: Pupils are equal, round, and reactive to light.  Neck: Normal range of motion. Neck supple.  Cardiovascular: Normal rate, regular rhythm and normal heart sounds.  Exam reveals no gallop and no friction rub.   No murmur  heard. Pulmonary/Chest: Effort normal and breath sounds normal. No respiratory distress. She has no wheezes. She has no rales.  Abdominal: Soft. Bowel sounds are normal. She exhibits no distension and no mass. There is no tenderness. There is no rebound and no guarding.  Musculoskeletal: Normal range of motion. She exhibits no edema and no tenderness.  Neurological: She is alert and oriented to person, place, and time. She displays no tremor. No cranial nerve deficit or sensory deficit. She exhibits normal muscle tone. Coordination normal. GCS eye subscore is 4. GCS verbal subscore is 5. GCS motor subscore is 6.  Skin: Skin is warm and dry.  Psychiatric: She has a normal mood and affect.    ED Course   Procedures (including critical care time)  Labs Reviewed  URINE RAPID DRUG SCREEN (HOSP PERFORMED) - Abnormal; Notable for the following:    Tetrahydrocannabinol POSITIVE (*)    All other components within normal limits  CBC WITH DIFFERENTIAL - Abnormal; Notable for the following:    WBC 13.6 (*)    Neutrophils Relative % 82 (*)    Neutro Abs 11.1 (*)    Lymphocytes Relative 10 (*)    Monocytes Absolute 1.1 (*)    All other components within normal limits  COMPREHENSIVE METABOLIC PANEL - Abnormal; Notable for the following:    Total Bilirubin 0.2 (*)    All other components within normal limits  URINALYSIS, ROUTINE W REFLEX MICROSCOPIC - Abnormal; Notable for the following:    Ketones, ur 15 (*)    All other components within normal limits  RAPID STREP SCREEN  CULTURE, GROUP A STREP  ETHANOL  D-DIMER, QUANTITATIVE  POCT PREGNANCY, URINE   Dg Chest Port 1 View  04/27/2013   *RADIOLOGY REPORT*  Clinical Data:  Chest pain  PORTABLE CHEST - 1 VIEW  Comparison: Portable exam 2046 hours without priors for comparison  Findings: Normal heart size, mediastinal contours, and pulmonary vascularity. Lungs clear. No pleural effusion or pneumothorax. Numerous EKG leads project over chest. Bones  unremarkable.  IMPRESSION: No acute abnormalities.   Original Report Authenticated By: Ulyses Southward, M.D.   1. URI (upper respiratory infection)   2. Myalgia     MDM   Pt is a 22 y.o. female with Pmhx as above who presents initially with dec responsiveness by private vehicle, but with sternal rub begins screaming and will then answer questions.  She reports several days of cough, congestion, rhinorrhea, sore throat, body aches and chest pain.  Cardio pulm exam benign, +ttp chest wall.  Erythematous oropharynx w/o exudates or concern for RPA or PTA.  Abdominal exam benign.  CMP unremarkable, mild leukocytosis, CXR unremarkable, UDS, ETOH, POC preg. & rapid strep negative.  Pt has no risk factors for PE and d-dimer negative. I believe she has viral URI w/ myalgias. At time of d/c she is awake alert, appropriate, tolerating PO.  Return precautions given for new or worsening symptoms including worsening pain, SOB, inability to tolerate PO.   1. URI (upper respiratory infection)   2. Myalgia       Shanna Cisco, MD 04/28/13 1024

## 2013-05-01 ENCOUNTER — Other Ambulatory Visit: Payer: Self-pay

## 2013-05-01 ENCOUNTER — Inpatient Hospital Stay (HOSPITAL_COMMUNITY)
Admission: AD | Admit: 2013-05-01 | Discharge: 2013-05-05 | DRG: 871 | Disposition: A | Discharge status: Home / Self Care | Payer: Managed Care, Other (non HMO) | Source: Ambulatory Visit | Attending: Internal Medicine | Admitting: Internal Medicine

## 2013-05-01 ENCOUNTER — Inpatient Hospital Stay (HOSPITAL_COMMUNITY): Payer: Self-pay

## 2013-05-01 ENCOUNTER — Encounter (HOSPITAL_COMMUNITY): Payer: Self-pay | Admitting: Nurse Practitioner

## 2013-05-01 ENCOUNTER — Emergency Department (HOSPITAL_COMMUNITY): Payer: Self-pay

## 2013-05-01 DIAGNOSIS — Z6830 Body mass index (BMI) 30.0-30.9, adult: Secondary | ICD-10-CM

## 2013-05-01 DIAGNOSIS — B9789 Other viral agents as the cause of diseases classified elsewhere: Secondary | ICD-10-CM | POA: Diagnosis present

## 2013-05-01 DIAGNOSIS — O26859 Spotting complicating pregnancy, unspecified trimester: Secondary | ICD-10-CM

## 2013-05-01 DIAGNOSIS — J189 Pneumonia, unspecified organism: Secondary | ICD-10-CM

## 2013-05-01 DIAGNOSIS — J069 Acute upper respiratory infection, unspecified: Secondary | ICD-10-CM | POA: Diagnosis present

## 2013-05-01 DIAGNOSIS — N390 Urinary tract infection, site not specified: Secondary | ICD-10-CM

## 2013-05-01 DIAGNOSIS — A419 Sepsis, unspecified organism: Principal | ICD-10-CM

## 2013-05-01 DIAGNOSIS — IMO0002 Reserved for concepts with insufficient information to code with codable children: Secondary | ICD-10-CM

## 2013-05-01 DIAGNOSIS — F1210 Cannabis abuse, uncomplicated: Secondary | ICD-10-CM

## 2013-05-01 DIAGNOSIS — Z87891 Personal history of nicotine dependence: Secondary | ICD-10-CM

## 2013-05-01 DIAGNOSIS — E669 Obesity, unspecified: Secondary | ICD-10-CM | POA: Diagnosis present

## 2013-05-01 DIAGNOSIS — R82998 Other abnormal findings in urine: Secondary | ICD-10-CM | POA: Diagnosis present

## 2013-05-01 DIAGNOSIS — O219 Vomiting of pregnancy, unspecified: Secondary | ICD-10-CM

## 2013-05-01 DIAGNOSIS — I10 Essential (primary) hypertension: Secondary | ICD-10-CM | POA: Diagnosis present

## 2013-05-01 DIAGNOSIS — R079 Chest pain, unspecified: Secondary | ICD-10-CM

## 2013-05-01 DIAGNOSIS — K529 Noninfective gastroenteritis and colitis, unspecified: Secondary | ICD-10-CM

## 2013-05-01 HISTORY — DX: Shortness of breath: R06.02

## 2013-05-01 HISTORY — DX: Chest pain, unspecified: R07.9

## 2013-05-01 HISTORY — DX: Pneumonia, unspecified organism: J18.9

## 2013-05-01 HISTORY — DX: Cannabis abuse, uncomplicated: F12.10

## 2013-05-01 HISTORY — DX: Sepsis, unspecified organism: A41.9

## 2013-05-01 HISTORY — DX: Headache: R51

## 2013-05-01 HISTORY — DX: Headache, unspecified: R51.9

## 2013-05-01 LAB — LIPASE, BLOOD: Lipase: 8 U/L — ABNORMAL LOW (ref 11–59)

## 2013-05-01 LAB — URINALYSIS, ROUTINE W REFLEX MICROSCOPIC
Ketones, ur: 15 mg/dL — AB
Leukocytes, UA: NEGATIVE
Nitrite: NEGATIVE
Specific Gravity, Urine: >1.03 — ABNORMAL HIGH (ref 1.005–1.030)
Urobilinogen, UA: 1 mg/dL (ref 0.0–1.0)
pH: 5.5 (ref 5.0–8.0)

## 2013-05-01 LAB — CBC
HCT: 38 % (ref 36.0–46.0)
Hemoglobin: 13 g/dL (ref 12.0–15.0)
MCH: 30 pg (ref 26.0–34.0)
MCV: 87.6 fL (ref 78.0–100.0)
RBC: 4.34 MIL/uL (ref 3.87–5.11)
WBC: 26.8 10*3/uL — ABNORMAL HIGH (ref 4.0–10.5)

## 2013-05-01 LAB — BLOOD GAS, ARTERIAL
O2 Content: 8 L/min
pCO2 arterial: 39.1 mmHg (ref 35.0–45.0)
pO2, Arterial: 103 mmHg — ABNORMAL HIGH (ref 80.0–100.0)

## 2013-05-01 LAB — COMPREHENSIVE METABOLIC PANEL
AST: 11 U/L (ref 0–37)
BUN: 9 mg/dL (ref 6–23)
CO2: 23 mEq/L (ref 19–32)
Calcium: 9.1 mg/dL (ref 8.4–10.5)
Chloride: 97 mEq/L (ref 96–112)
Creatinine, Ser: 0.58 mg/dL (ref 0.50–1.10)
GFR calc Af Amer: >90 mL/min (ref >90)
GFR calc non Af Amer: >90 mL/min (ref >90)
Glucose, Bld: 112 mg/dL — ABNORMAL HIGH (ref 70–99)
Total Bilirubin: 0.7 mg/dL (ref 0.3–1.2)

## 2013-05-01 LAB — URINE MICROSCOPIC-ADD ON

## 2013-05-01 LAB — CK TOTAL AND CKMB (NOT AT ARMC): Total CK: 45 U/L (ref 7–177)

## 2013-05-01 LAB — TROPONIN I: Troponin I: <0.3 ng/mL (ref <0.30)

## 2013-05-01 MED ORDER — GUAIFENESIN 100 MG/5ML PO SYRP
200.0000 mg | ORAL_SOLUTION | ORAL | Status: DC | PRN
  Administered 2013-05-02 – 2013-05-04 (×3): 200 mg via ORAL
  Filled 2013-05-01 (×3): qty 10

## 2013-05-01 MED ORDER — DEXTROSE 5 % IV SOLN
500.0000 mg | Freq: Once | INTRAVENOUS | Status: AC
  Administered 2013-05-01: 500 mg via INTRAVENOUS
  Filled 2013-05-01: qty 500

## 2013-05-01 MED ORDER — ACETAMINOPHEN 325 MG PO TABS
650.0000 mg | ORAL_TABLET | Freq: Four times a day (QID) | ORAL | Status: DC | PRN
  Administered 2013-05-01 – 2013-05-05 (×3): 650 mg via ORAL
  Filled 2013-05-01 (×3): qty 2

## 2013-05-01 MED ORDER — ENOXAPARIN SODIUM 40 MG/0.4ML ~~LOC~~ SOLN
40.0000 mg | SUBCUTANEOUS | Status: DC
  Administered 2013-05-01 – 2013-05-04 (×4): 40 mg via SUBCUTANEOUS
  Filled 2013-05-01 (×5): qty 0.4

## 2013-05-01 MED ORDER — LACTATED RINGERS IV SOLN
INTRAVENOUS | Status: DC
  Administered 2013-05-01: 12:00:00 via INTRAVENOUS

## 2013-05-01 MED ORDER — SODIUM CHLORIDE 0.9 % IV SOLN
INTRAVENOUS | Status: DC
  Administered 2013-05-01: 19:00:00 via INTRAVENOUS

## 2013-05-01 MED ORDER — VANCOMYCIN HCL IN DEXTROSE 1-5 GM/200ML-% IV SOLN
1000.0000 mg | Freq: Three times a day (TID) | INTRAVENOUS | Status: DC
  Administered 2013-05-01 – 2013-05-05 (×11): 1000 mg via INTRAVENOUS
  Filled 2013-05-01 (×13): qty 200

## 2013-05-01 MED ORDER — LORAZEPAM 2 MG/ML IJ SOLN
1.0000 mg | Freq: Once | INTRAMUSCULAR | Status: AC
  Administered 2013-05-01: 1 mg via INTRAVENOUS
  Filled 2013-05-01: qty 1

## 2013-05-01 MED ORDER — DEXTROSE 5 % IV SOLN
1.0000 g | INTRAVENOUS | Status: DC
  Administered 2013-05-02 – 2013-05-04 (×3): 1 g via INTRAVENOUS
  Filled 2013-05-01 (×4): qty 10

## 2013-05-01 MED ORDER — MORPHINE SULFATE 4 MG/ML IJ SOLN
6.0000 mg | Freq: Once | INTRAMUSCULAR | Status: AC
  Administered 2013-05-01: 6 mg via INTRAVENOUS
  Filled 2013-05-01: qty 2

## 2013-05-01 MED ORDER — MORPHINE SULFATE 4 MG/ML IJ SOLN
INTRAMUSCULAR | Status: AC
  Administered 2013-05-01: 1 mg
  Filled 2013-05-01: qty 1

## 2013-05-01 MED ORDER — IOHEXOL 350 MG/ML SOLN
100.0000 mL | Freq: Once | INTRAVENOUS | Status: AC | PRN
  Administered 2013-05-01: 100 mL via INTRAVENOUS

## 2013-05-01 MED ORDER — ONDANSETRON HCL 4 MG/2ML IJ SOLN
4.0000 mg | Freq: Once | INTRAMUSCULAR | Status: AC
  Administered 2013-05-01: 4 mg via INTRAVENOUS
  Filled 2013-05-01: qty 2

## 2013-05-01 MED ORDER — MORPHINE SULFATE 4 MG/ML IJ SOLN
4.0000 mg | Freq: Once | INTRAMUSCULAR | Status: AC
  Administered 2013-05-01: 4 mg via INTRAVENOUS
  Filled 2013-05-01: qty 1

## 2013-05-01 MED ORDER — PNEUMOCOCCAL VAC POLYVALENT 25 MCG/0.5ML IJ INJ
0.5000 mL | INJECTION | INTRAMUSCULAR | Status: AC
  Administered 2013-05-02: 0.5 mL via INTRAMUSCULAR
  Filled 2013-05-01: qty 0.5

## 2013-05-01 MED ORDER — MORPHINE SULFATE 4 MG/ML IJ SOLN
1.0000 mg | Freq: Once | INTRAMUSCULAR | Status: AC
  Administered 2013-05-01: 1 mg via INTRAVENOUS
  Filled 2013-05-01: qty 1

## 2013-05-01 MED ORDER — SODIUM CHLORIDE 0.9 % IV BOLUS (SEPSIS)
1000.0000 mL | Freq: Once | INTRAVENOUS | Status: AC
  Administered 2013-05-01: 1000 mL via INTRAVENOUS

## 2013-05-01 MED ORDER — SODIUM CHLORIDE 0.9 % IJ SOLN
3.0000 mL | Freq: Two times a day (BID) | INTRAMUSCULAR | Status: DC
  Administered 2013-05-02 – 2013-05-05 (×5): 3 mL via INTRAVENOUS

## 2013-05-01 MED ORDER — AZITHROMYCIN 500 MG PO TABS
500.0000 mg | ORAL_TABLET | Freq: Every day | ORAL | Status: DC
Start: 2013-05-02 — End: 2013-05-05
  Administered 2013-05-02 – 2013-05-05 (×4): 500 mg via ORAL
  Filled 2013-05-01 (×4): qty 1

## 2013-05-01 MED ORDER — DOCUSATE SODIUM 100 MG PO CAPS
100.0000 mg | ORAL_CAPSULE | Freq: Two times a day (BID) | ORAL | Status: DC
  Administered 2013-05-02 – 2013-05-04 (×4): 100 mg via ORAL
  Filled 2013-05-01 (×9): qty 1

## 2013-05-01 MED ORDER — ONDANSETRON HCL 4 MG/2ML IJ SOLN
4.0000 mg | Freq: Four times a day (QID) | INTRAMUSCULAR | Status: DC | PRN
  Administered 2013-05-01 – 2013-05-03 (×3): 4 mg via INTRAVENOUS
  Filled 2013-05-01 (×4): qty 2

## 2013-05-01 MED ORDER — MORPHINE SULFATE 4 MG/ML IJ SOLN
4.0000 mg | INTRAMUSCULAR | Status: DC | PRN
  Administered 2013-05-01 – 2013-05-04 (×6): 4 mg via INTRAVENOUS
  Filled 2013-05-01 (×6): qty 1

## 2013-05-01 MED ORDER — MORPHINE SULFATE 4 MG/ML IJ SOLN: 1.0000 mg | Freq: Once | INTRAMUSCULAR | Status: DC

## 2013-05-01 MED ORDER — KETOROLAC TROMETHAMINE 60 MG/2ML IM SOLN
60.0000 mg | Freq: Once | INTRAMUSCULAR | Status: DC
  Filled 2013-05-01: qty 2

## 2013-05-01 MED ORDER — DIAZEPAM 5 MG PO TABS: 5.0000 mg | ORAL_TABLET | Freq: Once | ORAL | Status: DC

## 2013-05-01 MED ORDER — ONDANSETRON HCL 4 MG PO TABS: 4.0000 mg | ORAL_TABLET | Freq: Four times a day (QID) | ORAL | Status: DC | PRN

## 2013-05-01 MED ORDER — DEXTROSE 5 % IV SOLN
1.0000 g | Freq: Once | INTRAVENOUS | Status: AC
  Administered 2013-05-01: 1 g via INTRAVENOUS
  Filled 2013-05-01: qty 10

## 2013-05-01 MED ORDER — HYDROMORPHONE HCL PF 1 MG/ML IJ SOLN: 1.0000 mg | Freq: Once | INTRAMUSCULAR | Status: DC

## 2013-05-01 MED ORDER — ACETAMINOPHEN 650 MG RE SUPP: 650.0000 mg | Freq: Four times a day (QID) | RECTAL | Status: DC | PRN

## 2013-05-01 NOTE — MAU Provider Note (Signed)
History     CSN: [id number]  Arrival date and time: 05/01/13 1105   None    Chief complaint: Chest Pain   HPI  Ms. Pamela Klein is a 22 y.o. non-pregnant female who presents to MAU with left sided chest pain, fever, chills and difficulty catching her breath.  She was seen at [facility] ED on 04/27/13 with similar symptoms and sent home with "URI". The pain is constant and worse on palpation; the patient describes difficulty taking in a full breath and difficulty laying flat.  The symptoms have gotten progressively worse since her [facility] visit.  She had a vaginal delivery in June with no complications.   OB History   Grav Para Term Preterm Abortions TAB SAB Ect Mult Living   1 1 1  0 0 0 0 0 0 1      Past Medical History  Diagnosis Date  . Chronic back pain   . Chlamydia   . H/O candidiasis   . H/O varicella   . Hypertension     Past Surgical History  Procedure Laterality Date  . No past surgeries      Family History  Problem Relation Age of Onset  . Hypertension Father   . Diabetes Paternal Grandmother     History  Substance Use Topics  . Smoking status: Former Smoker    Types: Cigarettes    Quit date: 06/29/2011  . Smokeless tobacco: Never Used  . Alcohol Use: No    Allergies: No Known Allergies  Prescriptions prior to admission  Medication Sig Dispense Refill  . Pseudoephedrine-APAP-DM (DAYQUIL MULTI-SYMPTOM COLD/FLU PO) Take 15 mLs by mouth every 6 (six) hours as needed (cold symptoms).       Results for orders placed during the hospital encounter of 05/01/13 (from the past 24 hour(s))  URINALYSIS, ROUTINE W REFLEX MICROSCOPIC     Status: Abnormal   Collection Time    05/01/13 11:05 AM      Result Value Range   Color, Urine AMBER (*) YELLOW   APPearance HAZY (*) CLEAR   Specific Gravity, Urine >1.030 (*) 1.005 - 1.030   pH 5.5  5.0 - 8.0   Glucose, UA NEGATIVE  NEGATIVE mg/dL   Hgb urine dipstick NEGATIVE  NEGATIVE   Bilirubin Urine SMALL (*) NEGATIVE   Ketones, ur 15 (*) NEGATIVE mg/dL   Protein, ur 409 (*) NEGATIVE mg/dL   Urobilinogen, UA 1.0  0.0 - 1.0 mg/dL   Nitrite NEGATIVE  NEGATIVE   Leukocytes, UA NEGATIVE  NEGATIVE  URINE MICROSCOPIC-ADD ON     Status: Abnormal   Collection Time    05/01/13 11:05 AM      Result Value Range   Squamous Epithelial / LPF MANY (*) RARE   WBC, UA 3-6  <3 WBC/hpf   RBC / HPF 0-2  <3 RBC/hpf   Bacteria, UA MANY (*) RARE  CBC     Status: Abnormal   Collection Time    05/01/13 11:25 AM      Result Value Range   WBC 26.8 (*) 4.0 - 10.5 K/uL   RBC 4.34  3.87 - 5.11 MIL/uL   Hemoglobin 13.0  12.0 - 15.0 g/dL   HCT 81.1  91.4 - 78.2 %   MCV 87.6  78.0 - 100.0 fL   MCH 30.0  26.0 - 34.0 pg   MCHC 34.2  30.0 - 36.0 g/dL   RDW 95.6  21.3 - 08.6 %   Platelets 241  150 - 400 K/uL  COMPREHENSIVE METABOLIC PANEL     Status: Abnormal   Collection Time    05/01/13 11:25 AM      Result Value Range   Sodium 135  135 - 145 mEq/L   Potassium 4.1  3.5 - 5.1 mEq/L   Chloride 97  96 - 112 mEq/L   CO2 23  19 - 32 mEq/L   Glucose, Bld 112 (*) 70 - 99 mg/dL   BUN 9  6 - 23 mg/dL   Creatinine, Ser 1.61  0.50 - 1.10 mg/dL   Calcium 9.1  8.4 - 09.6 mg/dL   Total Protein 6.7  6.0 - 8.3 g/dL   Albumin 3.3 (*) 3.5 - 5.2 g/dL   AST 11  0 - 37 U/L   ALT 16  0 - 35 U/L   Alkaline Phosphatase 84  39 - 117 U/L   Total Bilirubin 0.7  0.3 - 1.2 mg/dL   GFR calc non Af Amer >90  >90 mL/min   GFR calc Af Amer >90  >90 mL/min  POCT PREGNANCY, URINE     Status: None   Collection Time    05/01/13 11:26 AM      Result Value Range   Preg Test, Ur NEGATIVE  NEGATIVE  BLOOD GAS, ARTERIAL     Status: Abnormal   Collection Time    05/01/13 12:10 PM      Result Value Range   FIO2 1.00     O2 Content 8.0     Delivery systems OXYGEN MASK     pH, Arterial 7.398  7.350 - 7.450   pCO2 arterial 39.1  35.0 - 45.0 mmHg   pO2, Arterial 103.0 (*) 80.0 - 100.0 mmHg   Bicarbonate 23.6  20.0 - 24.0 mEq/L   TCO2 24.8  0 - 100  mmol/L   Acid-base deficit 0.5  0.0 - 2.0 mmol/L   O2 Saturation 100.0     Collection site RIGHT RADIAL     Drawn by 131     Sample type ARTERIAL     Allens test (pass/fail) PASS  PASS     Review of Systems  Constitutional: Positive for fever, chills and diaphoresis.  HENT: Positive for congestion.   Respiratory: Positive for cough, sputum production and shortness of breath.   Cardiovascular: Positive for chest pain.  Gastrointestinal: Positive for nausea.  Neurological: Positive for dizziness.  Psychiatric/Behavioral: Positive for substance abuse. The patient is nervous/anxious.        Marijuana    Physical Exam   Blood pressure 133/83, pulse 108, temperature 99.8 F (37.7 C), temperature source Oral, resp. rate 20, last menstrual period 04/13/2013, SpO2 100.00%.  Physical Exam  Constitutional: She is oriented to person, place, and time. She appears distressed.  HENT:  Head: Normocephalic.  Neck: Neck supple.  Cardiovascular: Normal rate and regular rhythm.   Respiratory: Stridor present. Tachypnea noted. No respiratory distress. She has rales. She exhibits tenderness.  Left sided chest tenderness that radiates to her L upper back.  Diminished breath sounds in bilateral lung bases. Rales throughout lung fields.   GI: There is tenderness. There is guarding.  Left upper abdominal guarding   Neurological: She is alert and oriented to person, place, and time.  Skin: Skin is warm. She is diaphoretic.    MAU Course  Procedures  Normal EKG reading  Maintain IV access 2 mg Morphine IV total given in MAU  Pt attempted to lay flat for chest xray and began screaming out in pain;  nurse and myself at the bedside. Palpable pulse was 39 bpm, O2 saturation dropped to 72%, oxygen mask was applied at 10 L and patient was brought up to 90 degrees which seemed to improve her hypoxia. Patient remained conscious; pulse stabilized and 1 mg of morphine was given for pain. Patient rates her  left sided chest pain 10/10. Rapid response team at bedside. Cardiac enzymes CBC CMET  Assessment and Plan  A: Fever Chest pain  P: Transfer to [facility] ED for further evaluation. Accepting physcian Dr. Shawnee Knapp, JENNIFER IRENE FNP-C 05/01/2013, 12:36 PM

## 2013-05-01 NOTE — ED Notes (Addendum)
Pt returned from CT, moaning and holding left chest. sts pain worse when coughing. Pt HR 120's. Dr. Juleen China made aware.

## 2013-05-01 NOTE — ED Notes (Signed)
CT called sts pt in too much pain unable to move to table. Verbal order for 4mg  of morphine IV given. RN to give to pt. Pt pain slightly decreased. Pt placed on 4L [place] prophilactically.

## 2013-05-01 NOTE — Progress Notes (Signed)
ANTIBIOTIC CONSULT NOTE - INITIAL  Pharmacy Consult for vancomycin Indication: pneumonia  No Known Allergies  Patient Measurements:   Adjusted Body Weight: 72 kg (per patient)  Vital Signs: Temp: 99 F (37.2 C) (08/26 1246) Temp src: Oral (08/26 1246) BP: 113/84 mmHg (08/26 1715) Pulse Rate: 113 (08/26 1715) Intake/Output from previous day:   Intake/Output from this shift:    Labs:  Recent Labs  05/01/13 1125  WBC 26.8*  HGB 13.0  PLT 241  CREATININE 0.58   The CrCl is unknown because both a height and weight (above a minimum accepted value) are required for this calculation. No results found for this basename: VANCOTROUGH, Leodis Binet, VANCORANDOM, GENTTROUGH, GENTPEAK, GENTRANDOM, TOBRATROUGH, TOBRAPEAK, TOBRARND, AMIKACINPEAK, AMIKACINTROU, AMIKACIN,  in the last 72 hours   Microbiology: Recent Results (from the past 720 hour(s))  RAPID STREP SCREEN     Status: None   Collection Time    04/27/13  8:45 PM      Result Value Range Status   Streptococcus, Group A Screen (Direct) NEGATIVE  NEGATIVE Final   Comment: (NOTE)     A Rapid Antigen test may result negative if the antigen level in the     sample is below the detection level of this test. The FDA has not     cleared this test as a stand-alone test therefore the rapid antigen     negative result has reflexed to a Group A Strep culture.  CULTURE, GROUP A STREP     Status: None   Collection Time    04/27/13  8:45 PM      Result Value Range Status   Specimen Description THROAT   Final   Special Requests NONE   Final   Culture     Final   Value: No Beta Hemolytic Streptococci Isolated     Performed at [facility]   Report Status 04/29/2013 FINAL   Final    Medical History: Past Medical History  Diagnosis Date  . Chronic back pain   . Chlamydia   . H/O candidiasis   . H/O varicella   . Hypertension     Medications:  Prescriptions prior to admission  Medication Sig Dispense Refill  .  acetaminophen (TYLENOL) 500 MG tablet Take 500 mg by mouth every 6 (six) hours as needed for pain.       Assessment: 22 year old woman to start on vancomycin for community acquired pneumonia.  Already receiving ceftriaxone and azithromycin.  Goal of Therapy:  Vancomycin trough level 15-20 mcg/ml  Plan:  Vancomycin 1g IV q8h.  Follow up renal function and cultures.  Will check a trough at steady state if vancomycin planned length of therapy will be at least 4 days or if renal function changes.  Mickeal Skinner 05/01/2013,6:19 PM

## 2013-05-01 NOTE — ED Provider Notes (Signed)
CSN: [id number]     Arrival date & time 05/01/13  1105 History   First MD Initiated Contact with Patient 05/01/13 1247     Chief Complaint  Patient presents with  . Chest Pain   (Consider location/radiation/quality/duration/timing/severity/associated sxs/prior Treatment) HPI  22 year old [facility] She initially presented with left-sided chest pain. Gradual onset about 3 days ago and progressively worse. Pleuritic. Constant with occasional sharper pain. Left anterior chest with radiation to her left axilla and left shoulder. Subjective fever. Occasional cough. Patient apparently had an episode of desaturation and bradycardia while at [facility]. I am unclear as to the exact circumstances of this event. On arrival to the emergency room here she appeared very uncomfortable complaining of left-sided chest pain. Mild sinus tachycardia. Oxygen saturations good on 2 L via nasal cannula. Normal to slightly hypertensive. Patient has a history of similar symptoms. No unusual leg pain or swelling. No recent procedures or prolonged immobilization. She had a normal d-dimer 4 days ago around the onset of her symptoms.  Past Medical History  Diagnosis Date  . Chronic back pain   . Chlamydia   . H/O candidiasis   . H/O varicella   . Hypertension    Past Surgical History  Procedure Laterality Date  . No past surgeries     Family History  Problem Relation Age of Onset  . Hypertension Father   . Diabetes Paternal Grandmother    History  Substance Use Topics  . Smoking status: Former Smoker    Types: Cigarettes    Quit date: 06/29/2011  . Smokeless tobacco: Never Used  . Alcohol Use: No   OB History   Grav Para Term Preterm Abortions TAB SAB Ect Mult Living   1 1 1  0 0 0 0 0 0 1     Review of Systems  All systems reviewed and negative, other than as noted in HPI.   Allergies  Review of patient's allergies indicates no known allergies.  Home  Medications   Current Outpatient Rx  Name  Route  Sig  Dispense  Refill  . acetaminophen (TYLENOL) 500 MG tablet   Oral   Take 500 mg by mouth every 6 (six) hours as needed for pain.          BP 116/68  Pulse 105  Temp(Src) 99 F (37.2 C) (Oral)  Resp 15  SpO2 100%  LMP 04/13/2013 Physical Exam  Nursing note and vitals reviewed. Constitutional: She appears well-developed and well-nourished.  Appears uncomfortable  HENT:  Head: Normocephalic and atraumatic.  Eyes: Conjunctivae are normal. Right eye exhibits no discharge. Left eye exhibits no discharge.  Neck: Neck supple.  Cardiovascular: Regular rhythm and normal heart sounds.  Exam reveals no gallop and no friction rub.   No murmur heard. Mild tachycardia  Pulmonary/Chest: Effort normal and breath sounds normal. No respiratory distress. She exhibits tenderness.  Abdominal: Soft. She exhibits no distension. There is no tenderness.  Musculoskeletal: She exhibits no edema and no tenderness.  Lower extremities symmetric as compared to each other. No calf tenderness. Negative Homan's. No palpable cords.   Neurological: She is alert.  Skin: Skin is warm and dry.  Psychiatric: She has a normal mood and affect. Her behavior is normal. Thought content normal.    ED Course  Procedures (including critical care time) Labs Review Labs Reviewed  URINALYSIS, ROUTINE W REFLEX MICROSCOPIC - Abnormal; Notable for the following:    Color, Urine AMBER (*)  APPearance HAZY (*)    Specific Gravity, Urine >1.030 (*)    Bilirubin Urine SMALL (*)    Ketones, ur 15 (*)    Protein, ur 100 (*)    All other components within normal limits  CBC - Abnormal; Notable for the following:    WBC 26.8 (*)    All other components within normal limits  COMPREHENSIVE METABOLIC PANEL - Abnormal; Notable for the following:    Glucose, Bld 112 (*)    Albumin 3.3 (*)    All other components within normal limits  URINE MICROSCOPIC-ADD ON - Abnormal;  Notable for the following:    Squamous Epithelial / LPF MANY (*)    Bacteria, UA MANY (*)    All other components within normal limits  BLOOD GAS, ARTERIAL - Abnormal; Notable for the following:    pO2, Arterial 103.0 (*)    All other components within normal limits  URINE CULTURE  CULTURE, BLOOD (ROUTINE X 2)  CULTURE, BLOOD (ROUTINE X 2)  TROPONIN I  CK TOTAL AND CKMB  POCT PREGNANCY, URINE   Imaging Review Dg Chest 1 View  05/01/2013   CLINICAL DATA:  Cough. Congestion. Chest pain.  Reported hemoptysis.  EXAM: CHEST - 1 VIEW  COMPARISON:  04/27/2013  FINDINGS: Dense lingular airspace opacity noted. Low lung volumes are present, causing crowding of the pulmonary vasculature. Left heart border obscured but overall heart size is upper normal. Right lung grossly clear.  IMPRESSION: 1. Dense consolidation/airspace opacity in the lingula. Differential diagnostic considerations include pneumonia and pulmonary hemorrhage. 2. Low lung volumes are present, causing crowding of the pulmonary vasculature.   Electronically Signed   By: Herbie Baltimore   On: 05/01/2013 12:39   Ct Angio Chest W/cm &/or Wo Cm  05/01/2013   CLINICAL DATA:  Left chest pain,, productive cough, fever.  EXAM: CT ANGIOGRAPHY CHEST WITH CONTRAST  TECHNIQUE: Multidetector CT imaging of the chest was performed using the standard protocol during bolus administration of intravenous contrast. Multiplanar CT image reconstructions including MIPs were obtained to evaluate the vascular anatomy.  CONTRAST:  OMNIPAQUE IOHEXOL 350 MG/ML SOLN  COMPARISON:  Chest x-ray earlier today  FINDINGS: Consolidation in the lingula and left lower lobe compatible with pneumonia. Minimal opacity in the right middle lobe could reflect early infiltrate/ pneumonia as well. There is a small pericardial effusion. Heart is borderline in size. Aorta is normal caliber. No mediastinal, hilar, or axillary adenopathy. Visualized thyroid and chest wall soft tissues  unremarkable. Trace bilateral pleural effusions.  No filling defects in the pulmonary arteries to suggest pulmonary emboli.  No acute bony abnormality. Imaging into the upper abdomen shows no acute findings.  Review of the MIP images confirms the above findings.  IMPRESSION: Consolidation in the lingula and left lower lobe, less pronounced in the right middle lobe. Findings compatible with multifocal pneumonia. Trace bilateral pleural effusions. Small pericardial effusion.  No pulmonary embolus.   Electronically Signed   By: Charlett Nose   On: 05/01/2013 15:44    MDM   1. Chest pain   2. Community Acquired Pneumonia  22 year old female with left-sided chest pain. X-ray and CT significant for pneumonia. Given patient's degree of discomfort and is this episode of hypoxia at [facility] hospital, will admit for further treatment and evaluation. Aside from sinus tachycardia patient has remained hemodynamically stable throughout her ED visit here.     Raeford Razor, MD 05/01/13 [phone]

## 2013-05-01 NOTE — MAU Note (Signed)
Patient presents to MAU with c/o pain radiating from L clavicle to underneath L breast. Reports seen at [facility] on Friday for same s/s. Denies hx of heart problems or asthma.  Denies drug use or any medications other than Tylenol 1000 mg at 0700 today. Report she has been coughing up blood and clear mucous since Friday. +sneezing, HA.

## 2013-05-01 NOTE — H&P (Signed)
[facility] History and Physical  Pamela Klein WUJ:[id number] DOB: [birth year] DOA: 05/01/2013   PCP: [name], MD   Chief Complaint: Fevers, coughing, chest pain  HPI:  22 year old female with a history of hypertension presents with a one-week history of worsening fevers, coughing, and left-sided chest pain. The patient complains of a one-week history of URI type symptoms for the 7-10 days. She went to [name] emergency department on 04/28/2013. She was diagnosed with a viral URI and discharged home. The patient's condition continued to worsen with subjective fevers and chills, worsening cough, shortness of breath, and left-sided chest discomfort. The patient describes the pain as pleuritic especially worse with deep breaths and coughing. The patient has also had some intermittent blood-tinged sputum. The patient has not been admitted to the hospital since June 2003 when she gave birth to her daughter. She has not had any antibiotics for the last 90 days. She denies any headache, visual disturbance, neck pain, dysuria, hematuria, rashes, synovitis.. She does complain of left upper abdominal discomfort with coughing. She also had multiple episodes of emesis yesterday without any today. No hematemesis. No hematochezia or melena. The patient went to [name] today and was subsequently transferred to [facility] ED. In the ED, ABG showed pH 7.39/39/103/23 on 100%. Currently, the patient's respiratory status is stable. Her oxygen saturation is 100% on 2 L. The patient was given ceftriaxone and azithromycin with 1 L of fluid. CT angiogram of the chest was negative for pulmonary embolus but showed consolidation in the lingula and left lower lobe with less prominent opacity in the right middle lobe. 04/27/2013 drug screen showed cannabis. BMP was unremarkable. CBC showed WBC 26.8. Initial troponin was negative. EKG shows sinus tachycardia without any ST-T wave change. Urine pregnancy test was  negative. Assessment/Plan: Sepsis -Present at the time of admission -Secondary to pneumonia Pneumonia -Likely community acquired -However given the patient's ill and toxic appearance with hemoptysis, I will start her on Vancomycin to cover for MRSA as well as PCN-resistant Streptococcus pneumoniae pending blood cultures -Blood cultures x2 sets have been ordered--please note these have been obtained after antibiotics were given -Continue ceftriaxone and azithromycin -Urine Legionella antigen -Urine Streptococcus pneumoniae antigen -HIV antibody  -Lactic acid -Sputum Gram stain and culture -IV fluids Chest pain -Likely musculoskeletal due to her cough -Cycle troponins -Robitussin when necessary coughing Abdominal pain with nausea and vomiting -Likely resulting from the patient's coughing -Abdominal x-ray -Check lipase -Morphine when necessary pain Cannabis use -Cessation discussed       Past Medical History  Diagnosis Date  . Chronic back pain   . Chlamydia   . H/O candidiasis   . H/O varicella   . Hypertension    Past Surgical History  Procedure Laterality Date  . No past surgeries     Social History:  reports that she quit smoking about 22 months ago. Her smoking use included Cigarettes. She smoked 0.00 packs per day. She has never used smokeless tobacco. She reports that she does not drink alcohol or use illicit drugs.   Family History  Problem Relation Age of Onset  . Hypertension Father   . Diabetes Paternal Grandmother      No Known Allergies    Prior to Admission medications   Medication Sig Start Date End Date Taking? Authorizing Provider  acetaminophen (TYLENOL) 500 MG tablet Take 500 mg by mouth every 6 (six) hours as needed for pain.   Yes Historical Provider, MD    Review of Systems:  Constitutional:  No weight loss, night sweats, Fevers, chills, fatigue.  Head&Eyes: No headache.  No vision loss.  No eye pain or scotoma ENT:  No  Difficulty swallowing,Tooth/dental problems,Sore throat,  No ear ache, post nasal drip,  Cardio-vascular:  No  Orthopnea, PND, swelling in lower extremities,  dizziness, palpitations  GI:  No   diarrhea, loss of appetite, hematochezia, melena, heartburn, indigestion, Resp:  No  .No wheezing.No chest wall deformity  Skin:  no rash or lesions.  GU:  no dysuria, change in color of urine, no urgency or frequency. No flank pain.  Musculoskeletal:  No joint pain or swelling. No decreased range of motion. No back pain.  Psych:  No change in mood or affect. No depression or anxiety. Neurologic: No headache, no dysesthesia, no focal weakness, no vision loss. No syncope  Physical Exam: Filed Vitals:   05/01/13 1521 05/01/13 1530 05/01/13 1545 05/01/13 1600  BP:  137/82 161/90 116/68  Pulse: 121 115 113 105  Temp:      TempSrc:      Resp: 22 21 15    SpO2: 94% 100% 98% 100%   General:  A&O x 3, NAD, toxic, pleasant/cooperative Head/Eye: No conjunctival hemorrhage, no icterus, [place]/AT, No nystagmus ENT:  No icterus,  No thrush, good dentition, no pharyngeal exudate; tympanic membranes without any bulging or erythema. Neck:  No masses,  no bruits; bilateral anterior cervical adenopathy CV:  RRR, no rub, no gallop, no S3 Lung:  Bilateral crackles left greater than right. Diminished breath sounds on the left. No wheezing. Good air movement. Abdomen: soft/left upper quadrant tenderness without any peritoneal signs. There is mild guarding. No rebound tenderness., +BS, nondistended, no peritoneal signs Ext: No cyanosis, No rashes, No petechiae, No lymphangitis, No edema   Labs on Admission:  Basic Metabolic Panel:  Recent Labs Lab 04/27/13 2036 05/01/13 1125  NA 137 135  K 3.7 4.1  CL 101 97  CO2 22 23  GLUCOSE 83 112*  BUN 14 9  CREATININE 0.64 0.58  CALCIUM 9.4 9.1   Liver Function Tests:  Recent Labs Lab 04/27/13 2036 05/01/13 1125  AST 12 11  ALT 8 16  ALKPHOS 58 84   BILITOT 0.2* 0.7  PROT 7.4 6.7  ALBUMIN 4.2 3.3*   No results found for this basename: LIPASE, AMYLASE,  in the last 168 hours No results found for this basename: AMMONIA,  in the last 168 hours CBC:  Recent Labs Lab 04/27/13 2036 05/01/13 1125  WBC 13.6* 26.8*  NEUTROABS 11.1*  --   HGB 14.0 13.0  HCT 41.3 38.0  MCV 88.1 87.6  PLT 201 241   Cardiac Enzymes:  Recent Labs Lab 05/01/13 1132  CKTOTAL 45  CKMB 1.2  TROPONINI <0.30   BNP: No components found with this basename: POCBNP,  CBG: No results found for this basename: GLUCAP,  in the last 168 hours  Radiological Exams on Admission: Dg Chest 1 View  05/01/2013   CLINICAL DATA:  Cough. Congestion. Chest pain.  Reported hemoptysis.  EXAM: CHEST - 1 VIEW  COMPARISON:  04/27/2013  FINDINGS: Dense lingular airspace opacity noted. Low lung volumes are present, causing crowding of the pulmonary vasculature. Left heart border obscured but overall heart size is upper normal. Right lung grossly clear.  IMPRESSION: 1. Dense consolidation/airspace opacity in the lingula. Differential diagnostic considerations include pneumonia and pulmonary hemorrhage. 2. Low lung volumes are present, causing crowding of the pulmonary vasculature.   Electronically Signed   By: Lorelle Gibbs  Liebkemann   On: 05/01/2013 12:39   Ct Angio Chest W/cm &/or Wo Cm  05/01/2013   CLINICAL DATA:  Left chest pain,, productive cough, fever.  EXAM: CT ANGIOGRAPHY CHEST WITH CONTRAST  TECHNIQUE: Multidetector CT imaging of the chest was performed using the standard protocol during bolus administration of intravenous contrast. Multiplanar CT image reconstructions including MIPs were obtained to evaluate the vascular anatomy.  CONTRAST:  OMNIPAQUE IOHEXOL 350 MG/ML SOLN  COMPARISON:  Chest x-ray earlier today  FINDINGS: Consolidation in the lingula and left lower lobe compatible with pneumonia. Minimal opacity in the right middle lobe could reflect early infiltrate/  pneumonia as well. There is a small pericardial effusion. Heart is borderline in size. Aorta is normal caliber. No mediastinal, hilar, or axillary adenopathy. Visualized thyroid and chest wall soft tissues unremarkable. Trace bilateral pleural effusions.  No filling defects in the pulmonary arteries to suggest pulmonary emboli.  No acute bony abnormality. Imaging into the upper abdomen shows no acute findings.  Review of the MIP images confirms the above findings.  IMPRESSION: Consolidation in the lingula and left lower lobe, less pronounced in the right middle lobe. Findings compatible with multifocal pneumonia. Trace bilateral pleural effusions. Small pericardial effusion.  No pulmonary embolus.   Electronically Signed   By: Charlett Nose   On: 05/01/2013 15:44    EKG: Independently reviewed. Sinus tachycardia, no ST-T wave change    Time spent:70 minutes Code Status:   FULL Family Communication:   husband at bedside   Antwaun Buth, DO  [facility] Pager [phone]  If 7PM-7AM, please contact night-coverage [email or web address] Password [facility] 05/01/2013, 4:37 PM

## 2013-05-01 NOTE — MAU Provider Note (Signed)
Attestation of Attending Supervision of Advanced Practitioner (CNM/NP): Evaluation and management procedures were performed by the Advanced Practitioner under my supervision and collaboration.  I have reviewed the Advanced Practitioner's note and chart, and I agree with the management and plan.  Kunaal Walkins 05/01/2013 12:44 PM

## 2013-05-01 NOTE — Significant Event (Signed)
Rapid Response Event Note  Overview:  Called to MAU room 8 @ 1125 for pt with SOB and chest pain. Pt noted to be tachypenic and anxious. O2 sat currently 100% on room air. Pt presented to [facility] ED for c/o difficulty breathing and d/c'd home with diagnosis URI. Upon turing pt onto R) side, HR decreased to 38 with O2 sats of 68%. Pt has had no relief following 1mg  morphine IV as of yet.     Initial Focused Assessment: Systolic murmur. Rales appreciated bilaterally with diminished bases. Pt cooperative but anxious. Skin W&D.  Interventions: 12 lead EKG shows sinus rhythm. Labs, ABG, PCXR done.    Event Summary: Report called to [facility] charge RN. Transferred to [facility] via [facility] for further evlauation.     at      at          [facility], Idalia Needle A

## 2013-05-01 NOTE — ED Notes (Signed)
[facility]. Pt has hx fo URI x3 days txing with dayquil. Pt dropped off to [facility] by unknown individual obtunded- sternal rubbed by staff and alert and moaning. Pt. Has pleuritic chest pain with increased pain with deep inspiration. D-dimer negative, WBC 26,000 Temp 99. ABG and EKG unsignificant. Pt desats and brady's when laid back. Pt sitting up in high-fowlers O2sats 98%/RA

## 2013-05-02 DIAGNOSIS — K5289 Other specified noninfective gastroenteritis and colitis: Secondary | ICD-10-CM

## 2013-05-02 DIAGNOSIS — N390 Urinary tract infection, site not specified: Secondary | ICD-10-CM | POA: Diagnosis present

## 2013-05-02 LAB — EXPECTORATED SPUTUM ASSESSMENT W GRAM STAIN, RFLX TO RESP C

## 2013-05-02 LAB — CBC
Platelets: 217 10*3/uL (ref 150–400)
RBC: 3.94 MIL/uL (ref 3.87–5.11)
RDW: 13.9 % (ref 11.5–15.5)
WBC: 21 10*3/uL — ABNORMAL HIGH (ref 4.0–10.5)

## 2013-05-02 LAB — LEGIONELLA ANTIGEN, URINE

## 2013-05-02 LAB — BASIC METABOLIC PANEL
Calcium: 8.7 mg/dL (ref 8.4–10.5)
Creatinine, Ser: 0.54 mg/dL (ref 0.50–1.10)
GFR calc Af Amer: >90 mL/min (ref >90)
GFR calc non Af Amer: >90 mL/min (ref >90)
Sodium: 135 mEq/L (ref 135–145)

## 2013-05-02 LAB — STREP PNEUMONIAE URINARY ANTIGEN: Strep Pneumo Urinary Antigen: NEGATIVE

## 2013-05-02 LAB — URINE CULTURE

## 2013-05-02 LAB — HIV ANTIBODY (ROUTINE TESTING W REFLEX): HIV: NONREACTIVE

## 2013-05-02 MED ORDER — IBUPROFEN 600 MG PO TABS
600.0000 mg | ORAL_TABLET | Freq: Three times a day (TID) | ORAL | Status: DC
  Administered 2013-05-02 – 2013-05-05 (×9): 600 mg via ORAL
  Filled 2013-05-02 (×12): qty 1

## 2013-05-02 MED ORDER — OXYCODONE HCL 5 MG PO TABS
5.0000 mg | ORAL_TABLET | ORAL | Status: DC | PRN
  Administered 2013-05-02 – 2013-05-04 (×5): 5 mg via ORAL
  Filled 2013-05-02 (×5): qty 1

## 2013-05-02 NOTE — Progress Notes (Signed)
Nutrition Brief Note  Patient identified on the Malnutrition Screening Tool (MST) Report. Pt reports a recent 5-lb weight loss however, pt's weight has been relatively stable over the past year:  Wt Readings from Last 15 Encounters:  05/01/13 180 lb (81.647 kg)  06/26/12 179 lb (81.194 kg)  05/05/12 177 lb (80.287 kg)  04/03/12 174 lb (78.926 kg)  02/27/12 185 lb (83.915 kg)  02/21/12 188 lb (85.276 kg)  02/14/12 187 lb (84.823 kg)  02/11/12 187 lb (84.823 kg)  02/04/12 185 lb (83.915 kg)  01/28/12 185 lb 8 oz (84.142 kg)  01/11/12 183 lb (83.008 kg)  12/30/11 182 lb (82.555 kg)  12/21/11 180 lb 12.8 oz (82.01 kg)  12/14/11 186 lb (84.369 kg)  10/25/11 172 lb (78.019 kg)    Body mass index is 30.88 kg/(m^2). Patient meets criteria for Obese Class I based on current BMI.   Current diet order is Heart Healthy. Labs and medications reviewed.   No nutrition interventions warranted at this time. If nutrition issues arise, please consult RD.   Jarold Motto MS, RD, LDN Pager: [phone] After-hours pager: [phone]

## 2013-05-02 NOTE — Progress Notes (Signed)
[facility] PROGRESS NOTE  Alonzo Loving ZOX:[id number] DOB: [birth year] DOA: 05/01/2013 PCP: [name], MD  Assessment/Plan: Active Problems:   Sepsis   Community acquired pneumonia   Chest pain   Cannabis abuse    1. Pneumonia/Possible Sepsis: Patient presented with respiratory tract symptoms over the prior 10 days or so, with progressive worsening, culminating left-sided pleuritic chest pain,  Cough, hemoptysis, as well as a mild tachycardia and leukocytosis of 26.8. CTA revealed consolidation in the lingula and left lower lobe, less pronounced in the right middle lobe. Fortunately, there was no PE. Managing with Rocephin/Vancomycin/Azithromycin, now day # 2. Blood cultures are pending, urine Streptococcus pneumoniae antigen is negative, as is HIV test. Urine Legionella antigen is pending. Today, patient feels marginally better, and is afebrile. HR has normalized, and chest pain has improved.  2. Abdominal pain/Nausea and vomiting: This occurred on 04/30/13 only, and has not recurred. There was no associated diarrhea. Lipase is normal at 8 and abdominal X-Ray revealed nonobstructive bowel gas pattern. Patient is tolerating clears. Will advance diet further, today.  3. UTI: Urine sediment revealed a positive sediment with mild pyuria and many bacteria. Adequately covered with current antibiotic choice. Awaiting urine culture.  4. Cannabis use:  UDS on 04/28/13, was positive for marijuana. Counseled.    Code Status: Full Code.  Family Communication:  Disposition Plan: to be determined.    Brief narrative: 22 year old female with a history of marijuana use, hypertension presents with a one-week history of worsening fevers, coughing, and left-sided chest pain, as well as URI type symptoms for the 7-10 days. She went to [facility] long emergency department on 04/28/2013, was diagnosed with a viral URI and discharged home. The patient's condition continued to worsen with subjective  fevers and chills, worsening cough, shortness of breath, and pleuritic left-sided chest discomfort, especially worse with deep breaths and coughing associated with intermittent blood-tinged sputum. She does complain of left upper abdominal discomfort with coughing and had multiple episodes of emesis on 04/30/13 only. She initially went to [facility] on 05/01/13, and was subsequently transferred to [facility] ED.  In the ED, ABG showed pH 7.39/39/103/23 on 100%. Chest CT angiogram was negative for pulmonary embolus but showed consolidation in the lingula and left lower lobe with less prominent opacity in the right middle lobe. WBC 26.8. Initial troponin was negative. EKG shows sinus tachycardia without any ST-T wave change. Urine pregnancy test was negative. Admitted for further management.    Consultants:  N/A.   Procedures:  CXR.  Abdominal X-Ray.   Chest CT Angiogram.   Antibiotics:  Rocephin 05/01/13>>>  Azithromycin 05/01/13>>.  Vancomycin 05/01/13>>>  HPI/Subjective: Feels better  Objective: Vital signs in last 24 hours: Temp:  [98 F (36.7 C)-99.8 F (37.7 C)] 98 F (36.7 C) (08/27 0400) Pulse Rate:  [77-121] 92 (08/27 0400) Resp:  [15-25] 20 (08/26 1851) BP: (97-161)/(49-106) 106/72 mmHg (08/27 0430) SpO2:  [77 %-100 %] 100 % (08/27 0400) Weight:  [81.647 kg (180 lb)] 81.647 kg (180 lb) (08/26 1851) Weight change:  Last BM Date: 04/30/13  Intake/Output from previous day:   Total I/O In: 240 [P.O.:240] Out: -    Physical Exam: General: Comfortable, alert, communicative, fully oriented, not short of breath at rest.  HEENT:  No clinical pallor, no jaundice, no conjunctival injection or discharge. NECK:  Supple, JVP not seen, no carotid bruits, no palpable lymphadenopathy, no palpable goiter. CHEST:  Clinically clear to auscultation, no wheezes, no crackles. HEART:  Sounds 1 and 2 heard,  normal, regular, no murmurs. ABDOMEN:  Full, soft, non-tender, no palpable  organomegaly, no palpable masses, normal bowel sounds. GENITALIA:  Not examined. LOWER EXTREMITIES:  No pitting edema, palpable peripheral pulses. MUSCULOSKELETAL SYSTEM:  Unremarkable. CENTRAL NERVOUS SYSTEM:  No focal neurologic deficit on gross examination.  Lab Results:  Recent Labs  05/01/13 1125 05/02/13 0135  WBC 26.8* 21.0*  HGB 13.0 12.0  HCT 38.0 34.7*  PLT 241 217    Recent Labs  05/01/13 1125 05/02/13 0135  NA 135 135  K 4.1 3.8  CL 97 99  CO2 23 25  GLUCOSE 112* 99  BUN 9 6  CREATININE 0.58 0.54  CALCIUM 9.1 8.7   Recent Results (from the past 240 hour(s))  RAPID STREP SCREEN     Status: None   Collection Time    04/27/13  8:45 PM      Result Value Range Status   Streptococcus, Group A Screen (Direct) NEGATIVE  NEGATIVE Final   Comment: (NOTE)     A Rapid Antigen test may result negative if the antigen level in the     sample is below the detection level of this test. The FDA has not     cleared this test as a stand-alone test therefore the rapid antigen     negative result has reflexed to a Group A Strep culture.  CULTURE, GROUP A STREP     Status: None   Collection Time    04/27/13  8:45 PM      Result Value Range Status   Specimen Description THROAT   Final   Special Requests NONE   Final   Culture     Final   Value: No Beta Hemolytic Streptococci Isolated     Performed at [facility]   Report Status 04/29/2013 FINAL   Final  CULTURE, BLOOD (ROUTINE X 2)     Status: None   Collection Time    05/01/13  4:38 PM      Result Value Range Status   Specimen Description BLOOD HAND LEFT   Final   Special Requests BOTTLES DRAWN AEROBIC ONLY 3CC   Final   Culture  Setup Time     Final   Value: 05/01/2013 19:33     Performed at [facility]   Culture     Final   Value:        BLOOD CULTURE RECEIVED NO GROWTH TO DATE CULTURE WILL BE HELD FOR 5 DAYS BEFORE ISSUING A FINAL NEGATIVE REPORT     Performed at [facility]   Report  Status PENDING   Incomplete  CULTURE, EXPECTORATED SPUTUM-ASSESSMENT     Status: None   Collection Time    05/02/13  5:55 AM      Result Value Range Status   Specimen Description SPUTUM   Final   Special Requests Normal   Final   Sputum evaluation     Final   Value: THIS SPECIMEN IS ACCEPTABLE. RESPIRATORY CULTURE REPORT TO FOLLOW.   Report Status 05/02/2013 FINAL   Final     Studies/Results: Dg Chest 1 View  05/01/2013   CLINICAL DATA:  Cough. Congestion. Chest pain.  Reported hemoptysis.  EXAM: CHEST - 1 VIEW  COMPARISON:  04/27/2013  FINDINGS: Dense lingular airspace opacity noted. Low lung volumes are present, causing crowding of the pulmonary vasculature. Left heart border obscured but overall heart size is upper normal. Right lung grossly clear.  IMPRESSION: 1. Dense consolidation/airspace opacity in the lingula.  Differential diagnostic considerations include pneumonia and pulmonary hemorrhage. 2. Low lung volumes are present, causing crowding of the pulmonary vasculature.   Electronically Signed   By: Herbie Baltimore   On: 05/01/2013 12:39   Ct Angio Chest W/cm &/or Wo Cm  05/01/2013   CLINICAL DATA:  Left chest pain,, productive cough, fever.  EXAM: CT ANGIOGRAPHY CHEST WITH CONTRAST  TECHNIQUE: Multidetector CT imaging of the chest was performed using the standard protocol during bolus administration of intravenous contrast. Multiplanar CT image reconstructions including MIPs were obtained to evaluate the vascular anatomy.  CONTRAST:  OMNIPAQUE IOHEXOL 350 MG/ML SOLN  COMPARISON:  Chest x-ray earlier today  FINDINGS: Consolidation in the lingula and left lower lobe compatible with pneumonia. Minimal opacity in the right middle lobe could reflect early infiltrate/ pneumonia as well. There is a small pericardial effusion. Heart is borderline in size. Aorta is normal caliber. No mediastinal, hilar, or axillary adenopathy. Visualized thyroid and chest wall soft tissues unremarkable.  Trace bilateral pleural effusions.  No filling defects in the pulmonary arteries to suggest pulmonary emboli.  No acute bony abnormality. Imaging into the upper abdomen shows no acute findings.  Review of the MIP images confirms the above findings.  IMPRESSION: Consolidation in the lingula and left lower lobe, less pronounced in the right middle lobe. Findings compatible with multifocal pneumonia. Trace bilateral pleural effusions. Small pericardial effusion.  No pulmonary embolus.   Electronically Signed   By: Charlett Nose   On: 05/01/2013 15:44   Dg Abd 2 Views  05/01/2013   *RADIOLOGY REPORT*  Clinical Data: Abdominal pain, pneumonia  ABDOMEN - 2 VIEW  Comparison: None.  Findings: Supine and left-side down decubitus radiographs show a nonobstructive bowel gas pattern. No free intraperitoneal air. Excreted contrast collects within the bladder and collecting systems.  IUD projects over the pelvis. No acute osseous finding.  IMPRESSION: Nonobstructive bowel gas pattern.   Original Report Authenticated By: Jearld Lesch, M.D.    Medications: Scheduled Meds: . azithromycin  500 mg Oral Daily  . cefTRIAXone (ROCEPHIN)  IV  1 g Intravenous Q24H  . docusate sodium  100 mg Oral BID  . enoxaparin (LOVENOX) injection  40 mg Subcutaneous Q24H  . sodium chloride  3 mL Intravenous Q12H  . vancomycin  1,000 mg Intravenous Q8H   Continuous Infusions: . sodium chloride 100 mL/hr at 05/01/13 1830   PRN Meds:.acetaminophen, acetaminophen, guaifenesin, morphine injection, ondansetron (ZOFRAN) IV, ondansetron    LOS: 1 day   Gian Ybarra,CHRISTOPHER  [facility] Pager [phone]. If 8PM-8AM, please contact night-coverage at [email or web address], password [facility] 05/02/2013, 11:43 AM  LOS: 1 day

## 2013-05-02 NOTE — Progress Notes (Signed)
Utilization review completed.  

## 2013-05-03 LAB — CBC
MCH: 30.6 pg (ref 26.0–34.0)
MCHC: 35 g/dL (ref 30.0–36.0)
MCV: 87.4 fL (ref 78.0–100.0)
Platelets: 243 10*3/uL (ref 150–400)
RBC: 3.82 MIL/uL — ABNORMAL LOW (ref 3.87–5.11)
RDW: 14.1 % (ref 11.5–15.5)

## 2013-05-03 LAB — BASIC METABOLIC PANEL
CO2: 26 mEq/L (ref 19–32)
Calcium: 8.9 mg/dL (ref 8.4–10.5)
Creatinine, Ser: 0.48 mg/dL — ABNORMAL LOW (ref 0.50–1.10)
GFR calc non Af Amer: >90 mL/min (ref >90)

## 2013-05-03 MED ORDER — ZOLPIDEM TARTRATE 5 MG PO TABS
5.0000 mg | ORAL_TABLET | Freq: Every evening | ORAL | Status: DC | PRN
  Administered 2013-05-03: 5 mg via ORAL
  Filled 2013-05-03: qty 1

## 2013-05-03 NOTE — Progress Notes (Signed)
[facility] PROGRESS NOTE  Pamela Klein WUJ:[id number] DOB: Jul 20, 1991 DOA: 05/01/2013 PCP: [name], MD  Assessment/Plan: Active Problems:   Sepsis   Community acquired pneumonia   Chest pain   Cannabis abuse   UTI (urinary tract infection)    1. Pneumonia/Possible Sepsis: Patient presented with respiratory tract symptoms over the prior 10 days or so, with progressive worsening, culminating left-sided pleuritic chest pain,  Cough, hemoptysis, as well as a mild tachycardia and leukocytosis of 26.8. CTA revealed consolidation in the lingula and left lower lobe, less pronounced in the right middle lobe. Fortunately, there was no PE. Managing with Rocephin/Vancomycin/Azithromycin, now day # 3. Blood cultures are negative so far, urine Streptococcus pneumoniae and Legionella antigen are  negative, as is HIV test. Patient is improving clincally, she has remained afebrile, and wcc is trending down. Chest pain has improved with NSAIDs.  2. Abdominal pain/Nausea and vomiting: This occurred on 04/30/13 only, and has not recurred. There was no associated diarrhea. Lipase is normal at 8 and abdominal X-Ray revealed nonobstructive bowel gas pattern. Patient is tolerating advanced.  3. UTI: Urine sediment revealed a positive sediment with mild pyuria and many bacteria. Adequately covered with current antibiotic choice. Urine culture is negative so far.  4. Cannabis use:  UDS on 04/28/13, was positive for marijuana. Counseled.    Code Status: Full Code.  Family Communication:  Disposition Plan: to be determined.    Brief narrative: 22 year old female with a history of marijuana use, hypertension presents with a one-week history of worsening fevers, coughing, and left-sided chest pain, as well as URI type symptoms for the 7-10 days. She went to [facility] long emergency department on 04/28/2013, was diagnosed with a viral URI and discharged home. The patient's condition continued to worsen  with subjective fevers and chills, worsening cough, shortness of breath, and pleuritic left-sided chest discomfort, especially worse with deep breaths and coughing associated with intermittent blood-tinged sputum. She does complain of left upper abdominal discomfort with coughing and had multiple episodes of emesis on 04/30/13 only. She initially went to [facility] on 05/01/13, and was subsequently transferred to [facility] ED.  In the ED, ABG showed pH 7.39/39/103/23 on 100%. Chest CT angiogram was negative for pulmonary embolus but showed consolidation in the lingula and left lower lobe with less prominent opacity in the right middle lobe. WBC 26.8. Initial troponin was negative. EKG shows sinus tachycardia without any ST-T wave change. Urine pregnancy test was negative. Admitted for further management.    Consultants:  N/A.   Procedures:  CXR.  Abdominal X-Ray.   Chest CT Angiogram.   Antibiotics:  Rocephin 05/01/13>>>  Azithromycin 05/01/13>>.  Vancomycin 05/01/13>>>  HPI/Subjective: No new issues.   Objective: Vital signs in last 24 hours: Temp:  [98 F (36.7 C)-98.6 F (37 C)] 98 F (36.7 C) (08/28 0555) Pulse Rate:  [75-92] 75 (08/28 0555) Resp:  [17-18] 18 (08/28 0555) BP: (121-132)/(68-75) 128/69 mmHg (08/28 0555) SpO2:  [100 %] 100 % (08/28 0555) Weight change:  Last BM Date: 05/02/13  Intake/Output from previous day: 08/27 0701 - 08/28 0700 In: 1530 [P.O.:480; IV Piggyback:1050] Out: -      Physical Exam: General: Comfortable, alert, communicative, fully oriented, not short of breath at rest.  HEENT:  No clinical pallor, no jaundice, no conjunctival injection or discharge. NECK:  Supple, JVP not seen, no carotid bruits, no palpable lymphadenopathy, no palpable goiter. CHEST:  Clinically clear to auscultation, no wheezes, no crackles. HEART:  Sounds 1 and 2  heard, normal, regular, no murmurs. ABDOMEN:  Full, soft, non-tender, no palpable organomegaly, no  palpable masses, normal bowel sounds. GENITALIA:  Not examined. LOWER EXTREMITIES:  No pitting edema, palpable peripheral pulses. MUSCULOSKELETAL SYSTEM:  Unremarkable. CENTRAL NERVOUS SYSTEM:  No focal neurologic deficit on gross examination.  Lab Results:  Recent Labs  05/02/13 0135 05/03/13 0735  WBC 21.0* 11.3*  HGB 12.0 11.7*  HCT 34.7* 33.4*  PLT 217 243    Recent Labs  05/02/13 0135 05/03/13 0735  NA 135 136  K 3.8 3.5  CL 99 101  CO2 25 26  GLUCOSE 99 100*  BUN 6 6  CREATININE 0.54 0.48*  CALCIUM 8.7 8.9   Recent Results (from the past 240 hour(s))  RAPID STREP SCREEN     Status: None   Collection Time    04/27/13  8:45 PM      Result Value Range Status   Streptococcus, Group A Screen (Direct) NEGATIVE  NEGATIVE Final   Comment: (NOTE)     A Rapid Antigen test may result negative if the antigen level in the     sample is below the detection level of this test. The FDA has not     cleared this test as a stand-alone test therefore the rapid antigen     negative result has reflexed to a Group A Strep culture.  CULTURE, GROUP A STREP     Status: None   Collection Time    04/27/13  8:45 PM      Result Value Range Status   Specimen Description THROAT   Final   Special Requests NONE   Final   Culture     Final   Value: No Beta Hemolytic Streptococci Isolated     Performed at [facility]   Report Status 04/29/2013 FINAL   Final  URINE CULTURE     Status: None   Collection Time    05/01/13 11:05 AM      Result Value Range Status   Specimen Description URINE, CLEAN CATCH   Final   Special Requests NONE   Final   Culture  Setup Time     Final   Value: 05/01/2013 15:42     Performed at [facility] Count     Final   Value: NO GROWTH     Performed at [facility]   Culture     Final   Value: NO GROWTH     Performed at [facility]   Report Status 05/02/2013 FINAL   Final  CULTURE, BLOOD (ROUTINE X 2)     Status:  None   Collection Time    05/01/13  4:38 PM      Result Value Range Status   Specimen Description BLOOD HAND LEFT   Final   Special Requests BOTTLES DRAWN AEROBIC ONLY 3CC   Final   Culture  Setup Time     Final   Value: 05/01/2013 19:33     Performed at [facility]   Culture     Final   Value:        BLOOD CULTURE RECEIVED NO GROWTH TO DATE CULTURE WILL BE HELD FOR 5 DAYS BEFORE ISSUING A FINAL NEGATIVE REPORT     Performed at [facility]   Report Status PENDING   Incomplete  CULTURE, BLOOD (ROUTINE X 2)     Status: None   Collection Time    05/01/13  5:40 PM  Result Value Range Status   Specimen Description BLOOD HAND LEFT   Final   Special Requests BOTTLES DRAWN AEROBIC ONLY 3CC   Final   Culture  Setup Time     Final   Value: 05/02/2013 01:11     Performed at [facility]   Culture     Final   Value:        BLOOD CULTURE RECEIVED NO GROWTH TO DATE CULTURE WILL BE HELD FOR 5 DAYS BEFORE ISSUING A FINAL NEGATIVE REPORT     Performed at [facility]   Report Status PENDING   Incomplete  CULTURE, EXPECTORATED SPUTUM-ASSESSMENT     Status: None   Collection Time    05/02/13  5:55 AM      Result Value Range Status   Specimen Description SPUTUM   Final   Special Requests Normal   Final   Sputum evaluation     Final   Value: THIS SPECIMEN IS ACCEPTABLE. RESPIRATORY CULTURE REPORT TO FOLLOW.   Report Status 05/02/2013 FINAL   Final  CULTURE, RESPIRATORY (NON-EXPECTORATED)     Status: None   Collection Time    05/02/13  5:55 AM      Result Value Range Status   Specimen Description SPUTUM   Final   Special Requests NONE   Final   Gram Stain     Final   Value: FEW WBC PRESENT,BOTH PMN AND MONONUCLEAR     RARE SQUAMOUS EPITHELIAL CELLS PRESENT     NO ORGANISMS SEEN     Performed at [facility]   Culture     Final   Value: NORMAL OROPHARYNGEAL FLORA     Performed at [facility]   Report Status PENDING   Incomplete      Studies/Results: Ct Angio Chest W/cm &/or Wo Cm  05/01/2013   CLINICAL DATA:  Left chest pain,, productive cough, fever.  EXAM: CT ANGIOGRAPHY CHEST WITH CONTRAST  TECHNIQUE: Multidetector CT imaging of the chest was performed using the standard protocol during bolus administration of intravenous contrast. Multiplanar CT image reconstructions including MIPs were obtained to evaluate the vascular anatomy.  CONTRAST:  OMNIPAQUE IOHEXOL 350 MG/ML SOLN  COMPARISON:  Chest x-ray earlier today  FINDINGS: Consolidation in the lingula and left lower lobe compatible with pneumonia. Minimal opacity in the right middle lobe could reflect early infiltrate/ pneumonia as well. There is a small pericardial effusion. Heart is borderline in size. Aorta is normal caliber. No mediastinal, hilar, or axillary adenopathy. Visualized thyroid and chest wall soft tissues unremarkable. Trace bilateral pleural effusions.  No filling defects in the pulmonary arteries to suggest pulmonary emboli.  No acute bony abnormality. Imaging into the upper abdomen shows no acute findings.  Review of the MIP images confirms the above findings.  IMPRESSION: Consolidation in the lingula and left lower lobe, less pronounced in the right middle lobe. Findings compatible with multifocal pneumonia. Trace bilateral pleural effusions. Small pericardial effusion.  No pulmonary embolus.   Electronically Signed   By: Charlett Nose   On: 05/01/2013 15:44   Dg Abd 2 Views  05/01/2013   *RADIOLOGY REPORT*  Clinical Data: Abdominal pain, pneumonia  ABDOMEN - 2 VIEW  Comparison: None.  Findings: Supine and left-side down decubitus radiographs show a nonobstructive bowel gas pattern. No free intraperitoneal air. Excreted contrast collects within the bladder and collecting systems.  IUD projects over the pelvis. No acute osseous finding.  IMPRESSION: Nonobstructive bowel gas pattern.   Original  Report Authenticated By: Jearld Lesch, M.D.     Medications: Scheduled Meds: . azithromycin  500 mg Oral Daily  . cefTRIAXone (ROCEPHIN)  IV  1 g Intravenous Q24H  . docusate sodium  100 mg Oral BID  . enoxaparin (LOVENOX) injection  40 mg Subcutaneous Q24H  . ibuprofen  600 mg Oral TID WC  . sodium chloride  3 mL Intravenous Q12H  . vancomycin  1,000 mg Intravenous Q8H   Continuous Infusions:   PRN Meds:.acetaminophen, acetaminophen, guaifenesin, morphine injection, ondansetron (ZOFRAN) IV, ondansetron, oxyCODONE    LOS: 2 days   Adalynd Donahoe,CHRISTOPHER  [facility] Pager [phone]. If 8PM-8AM, please contact night-coverage at [email or web address], password [facility] 05/03/2013, 1:04 PM  LOS: 2 days

## 2013-05-04 ENCOUNTER — Inpatient Hospital Stay (HOSPITAL_COMMUNITY): Payer: MEDICAID

## 2013-05-04 LAB — CULTURE, RESPIRATORY W GRAM STAIN: Culture: NORMAL

## 2013-05-04 LAB — BASIC METABOLIC PANEL
Calcium: 8.8 mg/dL (ref 8.4–10.5)
Chloride: 102 mEq/L (ref 96–112)
Creatinine, Ser: 0.54 mg/dL (ref 0.50–1.10)
GFR calc Af Amer: >90 mL/min (ref >90)

## 2013-05-04 LAB — CBC
MCV: 87 fL (ref 78.0–100.0)
Platelets: 343 10*3/uL (ref 150–400)
RDW: 14 % (ref 11.5–15.5)
WBC: 7.3 10*3/uL (ref 4.0–10.5)

## 2013-05-04 NOTE — Progress Notes (Addendum)
[facility] PROGRESS NOTE  Pamela Klein ZOX:[id number] DOB: 12-05-1990 DOA: 05/01/2013 PCP: [name], MD  Assessment/Plan: Active Problems:   Sepsis   Community acquired pneumonia   Chest pain   Cannabis abuse   UTI (urinary tract infection)    1. Pneumonia/Possible Sepsis: Patient presented with respiratory tract symptoms over the prior 10 days or so, with progressive worsening, culminating left-sided pleuritic chest pain,  Cough, hemoptysis, as well as a mild tachycardia and leukocytosis of 26.8. CTA revealed consolidation in the lingula and left lower lobe, less pronounced in the right middle lobe. Fortunately, there was no PE. Managing with Rocephin/Vancomycin/Azithromycin, now day # 4. Blood cultures are negative so far, urine Streptococcus pneumoniae and Legionella antigen are  negative, as is HIV test. Patient is improving clincally, she has remained afebrile, and wcc trended down and normalized. Chest pain has improved with NSAIDs, and sputum is now clear, with only minimal hemoptysis. For repeat CXR today.   2. Abdominal pain/Nausea and vomiting: This occurred on 04/30/13 only, and has not recurred. There was no associated diarrhea. Lipase is normal at 8 and abdominal X-Ray revealed nonobstructive bowel gas pattern. Patient is now tolerating regular diet. No recurrence/Resolved.  3. UTI: Urine sediment revealed a positive sediment with mild pyuria and many bacteria. Adequately covered with current antibiotic choice. Urine culture is negative so far.  4. Cannabis use:  UDS on 04/28/13, was positive for marijuana. Counseled.    Code Status: Full Code.  Family Communication:  Disposition Plan: to be determined. Possible discharge on 05/05/13.    Brief narrative: 22 year old female with a history of marijuana use, hypertension presents with a one-week history of worsening fevers, coughing, and left-sided chest pain, as well as URI type symptoms for the 7-10 days. She  went to [facility] long emergency department on 04/28/2013, was diagnosed with a viral URI and discharged home. The patient's condition continued to worsen with subjective fevers and chills, worsening cough, shortness of breath, and pleuritic left-sided chest discomfort, especially worse with deep breaths and coughing associated with intermittent blood-tinged sputum. She does complain of left upper abdominal discomfort with coughing and had multiple episodes of emesis on 04/30/13 only. She initially went to [facility] on 05/01/13, and was subsequently transferred to [facility] ED.  In the ED, ABG showed pH 7.39/39/103/23 on 100%. Chest CT angiogram was negative for pulmonary embolus but showed consolidation in the lingula and left lower lobe with less prominent opacity in the right middle lobe. WBC 26.8. Initial troponin was negative. EKG shows sinus tachycardia without any ST-T wave change. Urine pregnancy test was negative. Admitted for further management.    Consultants:  N/A.   Procedures:  CXR.  Abdominal X-Ray.   Chest CT Angiogram.   Antibiotics:  Rocephin 05/01/13>>>  Azithromycin 05/01/13>>.  Vancomycin 05/01/13>>>  HPI/Subjective: Feels much better.   Objective: Vital signs in last 24 hours: Temp:  [98.4 F (36.9 C)-99 F (37.2 C)] 98.5 F (36.9 C) (08/29 0526) Pulse Rate:  [63-78] 78 (08/29 0526) BP: (121-139)/(64-73) 121/64 mmHg (08/29 0526) SpO2:  [96 %-100 %] 96 % (08/29 0526) Weight:  [90.266 kg (199 lb)] 90.266 kg (199 lb) (08/29 0526) Weight change:  Last BM Date: 05/02/13  Intake/Output from previous day: 08/28 0701 - 08/29 0700 In: 450 [IV Piggyback:450] Out: -  Total I/O In: 238 [P.O.:238] Out: -    Physical Exam: General: Comfortable, alert, communicative, fully oriented, not short of breath at rest.  HEENT:  No clinical pallor, no jaundice, no  conjunctival injection or discharge. NECK:  Supple, JVP not seen, no carotid bruits, no palpable  lymphadenopathy, no palpable goiter. CHEST:  Clinically clear to auscultation, no wheezes, no crackles. HEART:  Sounds 1 and 2 heard, normal, regular, no murmurs. ABDOMEN:  Full, soft, non-tender, no palpable organomegaly, no palpable masses, normal bowel sounds. GENITALIA:  Not examined. LOWER EXTREMITIES:  No pitting edema, palpable peripheral pulses. MUSCULOSKELETAL SYSTEM:  Unremarkable. CENTRAL NERVOUS SYSTEM:  No focal neurologic deficit on gross examination.  Lab Results:  Recent Labs  05/03/13 0735 05/04/13 0500  WBC 11.3* 7.3  HGB 11.7* 11.6*  HCT 33.4* 34.0*  PLT 243 343    Recent Labs  05/03/13 0735 05/04/13 0500  NA 136 138  K 3.5 3.8  CL 101 102  CO2 26 24  GLUCOSE 100* 93  BUN 6 6  CREATININE 0.48* 0.54  CALCIUM 8.9 8.8   Recent Results (from the past 240 hour(s))  RAPID STREP SCREEN     Status: None   Collection Time    04/27/13  8:45 PM      Result Value Range Status   Streptococcus, Group A Screen (Direct) NEGATIVE  NEGATIVE Final   Comment: (NOTE)     A Rapid Antigen test may result negative if the antigen level in the     sample is below the detection level of this test. The FDA has not     cleared this test as a stand-alone test therefore the rapid antigen     negative result has reflexed to a Group A Strep culture.  CULTURE, GROUP A STREP     Status: None   Collection Time    04/27/13  8:45 PM      Result Value Range Status   Specimen Description THROAT   Final   Special Requests NONE   Final   Culture     Final   Value: No Beta Hemolytic Streptococci Isolated     Performed at [facility]   Report Status 04/29/2013 FINAL   Final  URINE CULTURE     Status: None   Collection Time    05/01/13 11:05 AM      Result Value Range Status   Specimen Description URINE, CLEAN CATCH   Final   Special Requests NONE   Final   Culture  Setup Time     Final   Value: 05/01/2013 15:42     Performed at [facility] Count      Final   Value: NO GROWTH     Performed at [facility]   Culture     Final   Value: NO GROWTH     Performed at [facility]   Report Status 05/02/2013 FINAL   Final  CULTURE, BLOOD (ROUTINE X 2)     Status: None   Collection Time    05/01/13  4:38 PM      Result Value Range Status   Specimen Description BLOOD HAND LEFT   Final   Special Requests BOTTLES DRAWN AEROBIC ONLY 3CC   Final   Culture  Setup Time     Final   Value: 05/01/2013 19:33     Performed at [facility]   Culture     Final   Value:        BLOOD CULTURE RECEIVED NO GROWTH TO DATE CULTURE WILL BE HELD FOR 5 DAYS BEFORE ISSUING A FINAL NEGATIVE REPORT     Performed at [facility]   Report Status PENDING   Incomplete  CULTURE, BLOOD (ROUTINE X 2)     Status: None   Collection Time    05/01/13  5:40 PM      Result Value Range Status   Specimen Description BLOOD HAND LEFT   Final   Special Requests BOTTLES DRAWN AEROBIC ONLY 3CC   Final   Culture  Setup Time     Final   Value: 05/02/2013 01:11     Performed at [facility]   Culture     Final   Value:        BLOOD CULTURE RECEIVED NO GROWTH TO DATE CULTURE WILL BE HELD FOR 5 DAYS BEFORE ISSUING A FINAL NEGATIVE REPORT     Performed at [facility]   Report Status PENDING   Incomplete  CULTURE, EXPECTORATED SPUTUM-ASSESSMENT     Status: None   Collection Time    05/02/13  5:55 AM      Result Value Range Status   Specimen Description SPUTUM   Final   Special Requests Normal   Final   Sputum evaluation     Final   Value: THIS SPECIMEN IS ACCEPTABLE. RESPIRATORY CULTURE REPORT TO FOLLOW.   Report Status 05/02/2013 FINAL   Final  CULTURE, RESPIRATORY (NON-EXPECTORATED)     Status: None   Collection Time    05/02/13  5:55 AM      Result Value Range Status   Specimen Description SPUTUM   Final   Special Requests NONE   Final   Gram Stain     Final   Value: FEW WBC PRESENT,BOTH PMN AND MONONUCLEAR     RARE SQUAMOUS  EPITHELIAL CELLS PRESENT     NO ORGANISMS SEEN     Performed at [facility]   Culture     Final   Value: NORMAL OROPHARYNGEAL FLORA     Performed at [facility]   Report Status 05/04/2013 FINAL   Final     Studies/Results: No results found.  Medications: Scheduled Meds: . azithromycin  500 mg Oral Daily  . cefTRIAXone (ROCEPHIN)  IV  1 g Intravenous Q24H  . docusate sodium  100 mg Oral BID  . enoxaparin (LOVENOX) injection  40 mg Subcutaneous Q24H  . ibuprofen  600 mg Oral TID WC  . sodium chloride  3 mL Intravenous Q12H  . vancomycin  1,000 mg Intravenous Q8H   Continuous Infusions:   PRN Meds:.acetaminophen, acetaminophen, guaifenesin, morphine injection, ondansetron (ZOFRAN) IV, ondansetron, oxyCODONE, zolpidem    LOS: 3 days   Casmer Yepiz,CHRISTOPHER  [facility] Pager [phone]. If 8PM-8AM, please contact night-coverage at [email or web address], password [facility] 05/04/2013, 12:02 PM  LOS: 3 days

## 2013-05-04 NOTE — Progress Notes (Signed)
ANTIBIOTIC CONSULT NOTE -  Follow Up  Pharmacy Consult for vancomycin Indication: pneumonia  No Known Allergies  Patient Measurements: Height: 5\' 4"  (162.6 cm) Weight: 199 lb (90.266 kg) IBW/kg (Calculated) : 54.7 Adjusted Body Weight: 72 kg (per patient)  Vital Signs: Temp: 98.6 F (37 C) (08/29 1429) Temp src: Oral (08/29 1429) BP: 125/67 mmHg (08/29 1429) Pulse Rate: 64 (08/29 1429) Intake/Output from previous day: 08/28 0701 - 08/29 0700 In: 450 [IV Piggyback:450] Out: -  Intake/Output from this shift: Total I/O In: 458 [P.O.:458] Out: -   Labs:  Recent Labs  05/02/13 0135 05/03/13 0735 05/04/13 0500  WBC 21.0* 11.3* 7.3  HGB 12.0 11.7* 11.6*  PLT 217 243 343  CREATININE 0.54 0.48* 0.54   Estimated Creatinine Clearance: 120 ml/min (by C-G formula based on Cr of 0.54).  Microbiology: Recent Results (from the past 720 hour(s))  RAPID STREP SCREEN     Status: None   Collection Time    04/27/13  8:45 PM      Result Value Range Status   Streptococcus, Group A Screen (Direct) NEGATIVE  NEGATIVE Final   Comment: (NOTE)     A Rapid Antigen test may result negative if the antigen level in the     sample is below the detection level of this test. The FDA has not     cleared this test as a stand-alone test therefore the rapid antigen     negative result has reflexed to a Group A Strep culture.  CULTURE, GROUP A STREP     Status: None   Collection Time    04/27/13  8:45 PM      Result Value Range Status   Specimen Description THROAT   Final   Special Requests NONE   Final   Culture     Final   Value: No Beta Hemolytic Streptococci Isolated     Performed at [facility]   Report Status 04/29/2013 FINAL   Final  URINE CULTURE     Status: None   Collection Time    05/01/13 11:05 AM      Result Value Range Status   Specimen Description URINE, CLEAN CATCH   Final   Special Requests NONE   Final   Culture  Setup Time     Final   Value: 05/01/2013 15:42      Performed at [facility] Count     Final   Value: NO GROWTH     Performed at [facility]   Culture     Final   Value: NO GROWTH     Performed at [facility]   Report Status 05/02/2013 FINAL   Final  CULTURE, BLOOD (ROUTINE X 2)     Status: None   Collection Time    05/01/13  4:38 PM      Result Value Range Status   Specimen Description BLOOD HAND LEFT   Final   Special Requests BOTTLES DRAWN AEROBIC ONLY 3CC   Final   Culture  Setup Time     Final   Value: 05/01/2013 19:33     Performed at [facility]   Culture     Final   Value:        BLOOD CULTURE RECEIVED NO GROWTH TO DATE CULTURE WILL BE HELD FOR 5 DAYS BEFORE ISSUING A FINAL NEGATIVE REPORT     Performed at [facility]   Report Status PENDING   Incomplete  CULTURE, BLOOD (ROUTINE X 2)     Status: None   Collection Time    05/01/13  5:40 PM      Result Value Range Status   Specimen Description BLOOD HAND LEFT   Final   Special Requests BOTTLES DRAWN AEROBIC ONLY 3CC   Final   Culture  Setup Time     Final   Value: 05/02/2013 01:11     Performed at [facility]   Culture     Final   Value:        BLOOD CULTURE RECEIVED NO GROWTH TO DATE CULTURE WILL BE HELD FOR 5 DAYS BEFORE ISSUING A FINAL NEGATIVE REPORT     Performed at [facility]   Report Status PENDING   Incomplete  CULTURE, EXPECTORATED SPUTUM-ASSESSMENT     Status: None   Collection Time    05/02/13  5:55 AM      Result Value Range Status   Specimen Description SPUTUM   Final   Special Requests Normal   Final   Sputum evaluation     Final   Value: THIS SPECIMEN IS ACCEPTABLE. RESPIRATORY CULTURE REPORT TO FOLLOW.   Report Status 05/02/2013 FINAL   Final  CULTURE, RESPIRATORY (NON-EXPECTORATED)     Status: None   Collection Time    05/02/13  5:55 AM      Result Value Range Status   Specimen Description SPUTUM   Final   Special Requests NONE   Final   Gram Stain     Final   Value:  FEW WBC PRESENT,BOTH PMN AND MONONUCLEAR     RARE SQUAMOUS EPITHELIAL CELLS PRESENT     NO ORGANISMS SEEN     Performed at [facility]   Culture     Final   Value: NORMAL OROPHARYNGEAL FLORA     Performed at [facility]   Report Status 05/04/2013 FINAL   Final    Medical History: Past Medical History  Diagnosis Date  . Chronic back pain   . Chlamydia   . H/O candidiasis   . H/O varicella   . Hypertension   . Chest pain on exertion     "the last couple of days; I've been doing alot of coughing" (05/01/2013)  . Pneumonia     "now's the first time" (05/01/2013)  . Shortness of breath     "all the time right now" (05/01/2013)  . Daily headache     "last few days; since I've been sick" (05/01/2013)    Medications:  Azithromycin 500 mg PO daily CefTRIAXone 1 gm IV daily Cancomycin  1 gm IV every 8 hours  Assessment: 22 year old woman admitted with fever, hypertension, cough and left-sided chest pain.  She was started on vancomycin, ceftriaxone and azithromycin empirically for community acquired pneumonia.  The highest documented fever here has been 99.9 and today she is afebrile.  Her WBC has dropped from 21K >> 7.3.  Spoke with Dr. Brien Few regarding de-escalation of her antibiotics to decrease risk of resistance, and he plans to do so soon.  Her renal function has remained unchanged with an estimated crcl of 134ml/min.  She has tolerated all antibiotics without noted complications.  Goal of Therapy:  Vancomycin trough level 15-20 mcg/ml  Plan:  1.  Continue all antibiotics as ordered 2.  Monitor renal function and adjust doses accordingly 3.  If Dr. Brien Few doesn't de-escalate soon, will need to check a s/s level to ensure adequate clearance of her  Junious Silk, PharmD., MS Clinical Pharmacist Pager:  [phone] Thank you for allowing pharmacy to be part of this patients care team. 05/04/2013,2:38 PM

## 2013-05-05 LAB — CBC
Platelets: 355 10*3/uL (ref 150–400)
RDW: 13.5 % (ref 11.5–15.5)
WBC: 5.9 10*3/uL (ref 4.0–10.5)

## 2013-05-05 LAB — BASIC METABOLIC PANEL
Chloride: 100 mEq/L (ref 96–112)
GFR calc Af Amer: >90 mL/min (ref >90)
Potassium: 3.7 mEq/L (ref 3.5–5.1)

## 2013-05-05 MED ORDER — IBUPROFEN 600 MG PO TABS: 600.0000 mg | ORAL_TABLET | Freq: Three times a day (TID) | ORAL | Status: DC

## 2013-05-05 MED ORDER — METOCLOPRAMIDE HCL 10 MG PO TABS: 10.0000 mg | ORAL_TABLET | Freq: Three times a day (TID) | ORAL | Status: DC

## 2013-05-05 MED ORDER — GUAIFENESIN ER 600 MG PO TB12: 1200.0000 mg | ORAL_TABLET | Freq: Two times a day (BID) | ORAL | Status: DC

## 2013-05-05 MED ORDER — LEVOFLOXACIN 750 MG PO TABS: 750.0000 mg | ORAL_TABLET | Freq: Every day | ORAL | Status: DC

## 2013-05-05 NOTE — Discharge Summary (Signed)
Physician Discharge Summary  Pamela Klein ZOX:[id number] DOB: 1991/05/20 DOA: 05/01/2013  PCP: [name], MD  Admit date: 05/01/2013 Discharge date: 05/05/2013  Time spent: 40 minutes  Recommendations for Outpatient Follow-up:  1. Follow up with PMD.   Discharge Diagnoses:  Active Problems:   Sepsis   Community acquired pneumonia   Chest pain   Cannabis abuse   UTI (urinary tract infection)   Discharge Condition: Satisfactory.   Diet recommendation: Regular.   Filed Weights   05/01/13 1851 05/04/13 0526  Weight: 81.647 kg (180 lb) 90.266 kg (199 lb)    History of present illness:  22 year old female with a history of marijuana use, hypertension presents with a one-week history of worsening fevers, coughing, and left-sided chest pain, as well as URI type symptoms for the 7-10 days. She went to [facility] long emergency department on 04/28/2013, was diagnosed with a viral URI and discharged home. The patient's condition continued to worsen with subjective fevers and chills, worsening cough, shortness of breath, and pleuritic left-sided chest discomfort, especially worse with deep breaths and coughing associated with intermittent blood-tinged sputum. She does complain of left upper abdominal discomfort with coughing and had multiple episodes of emesis on 04/30/13 only. She initially went to [facility] on 05/01/13, and was subsequently transferred to [facility] ED.  In the ED, ABG showed pH 7.39/39/103/23 on 100%. Chest CT angiogram was negative for pulmonary embolus but showed consolidation in the lingula and left lower lobe with less prominent opacity in the right middle lobe. WBC 26.8. Initial troponin was negative. EKG shows sinus tachycardia without any ST-T wave change. Urine pregnancy test was negative. Admitted for further management.    Hospital Course:  1. Pneumonia/Possible Sepsis: Patient presented with respiratory tract symptoms over the prior 10 days or so, with  progressive worsening, culminating left-sided pleuritic chest pain, cough, hemoptysis, as well as a mild tachycardia and leukocytosis of 26.8. CTA revealed consolidation in the lingula and left lower lobe, less pronounced in the right middle lobe. Fortunately, there was no PE. Managed with iv Rocephin/Vancomycin/Azithromycin, as well as symptomatic therapy., with satisfactory clinical response. Blood cultures remained negative, urine Streptococcus pneumoniae and Legionella antigen were negative, as was HIV test. Patient remained afebrile, wcc trended down and normalized at 7.3 on 05/04/13. Sputum cleared up. Repeat CXR of 05/04/13, demonstrated interval stability. Patient was transitioned to oral Levaquin monotherapy on 830/14, to conclude antibiotic therapy on 05/12/13.   2. Abdominal pain/Nausea and vomiting: This occurred on 04/30/13 only, and did not recur. There was no associated diarrhea. Lipase was normal at 8 and abdominal X-Ray revealed nonobstructive bowel gas pattern. Patient tolerated regular diet. No recurrence/Resolved.  3. UTI: Urine sediment revealed a positive sediment with mild pyuria and many bacteria. Adequately covered with current antibiotic choice. Urine culture remained negative.  4. Cannabis use: UDS on 04/28/13, was positive for marijuana. Counseled.    Procedures:  See Below.   Consultations:  N/A.   Discharge Exam: Filed Vitals:   05/05/13 0549  BP: 125/74  Pulse: 65  Temp: 98.1 F (36.7 C)  Resp: 18    General: Comfortable, alert, communicative, fully oriented, not short of breath at rest.  HEENT: No clinical pallor, no jaundice, no conjunctival injection or discharge.  NECK: Supple, JVP not seen, no carotid bruits, no palpable lymphadenopathy, no palpable goiter.  CHEST: Clinically clear to auscultation, no wheezes, no crackles.  HEART: Sounds 1 and 2 heard, normal, regular, no murmurs.  ABDOMEN: Full, soft, non-tender, no palpable organomegaly,  no palpable  masses, normal bowel sounds.  GENITALIA: Not examined.  LOWER EXTREMITIES: No pitting edema, palpable peripheral pulses.  MUSCULOSKELETAL SYSTEM: Unremarkable.  CENTRAL NERVOUS SYSTEM: No focal neurologic deficit on gross examination.  Discharge Instructions      Discharge Orders   Future Orders Complete By Expires   Diet general  As directed    Increase activity slowly  As directed        Medication List         acetaminophen 500 MG tablet  Commonly known as:  TYLENOL  Take 500 mg by mouth every 6 (six) hours as needed for pain.     guaiFENesin 600 MG 12 hr tablet  Commonly known as:  MUCINEX  Take 2 tablets (1,200 mg total) by mouth 2 (two) times daily.     ibuprofen 600 MG tablet  Commonly known as:  ADVIL,MOTRIN  Take 1 tablet (600 mg total) by mouth 3 (three) times daily with meals.     levofloxacin 750 MG tablet  Commonly known as:  LEVAQUIN  Take 1 tablet (750 mg total) by mouth daily.     metoCLOPramide 10 MG tablet  Commonly known as:  REGLAN  Take 1 tablet (10 mg total) by mouth 4 (four) times daily -  with meals and at bedtime.       No Known Allergies Follow-up Information   Call [name], MD.   Specialty:  Obstetrics and Gynecology   Contact information:   [address] [place] [address] [phone]        The results of significant diagnostics from this hospitalization (including imaging, microbiology, ancillary and laboratory) are listed below for reference.    Significant Diagnostic Studies: Dg Chest 1 View  05/01/2013   CLINICAL DATA:  Cough. Congestion. Chest pain.  Reported hemoptysis.  EXAM: CHEST - 1 VIEW  COMPARISON:  04/27/2013  FINDINGS: Dense lingular airspace opacity noted. Low lung volumes are present, causing crowding of the pulmonary vasculature. Left heart border obscured but overall heart size is upper normal. Right lung grossly clear.  IMPRESSION: 1. Dense consolidation/airspace opacity in the  lingula. Differential diagnostic considerations include pneumonia and pulmonary hemorrhage. 2. Low lung volumes are present, causing crowding of the pulmonary vasculature.   Electronically Signed   By: Herbie Baltimore   On: 05/01/2013 12:39   Dg Chest 2 View  05/04/2013   *RADIOLOGY REPORT*  Clinical Data: Follow up pneumonia.  Coughing.  CHEST - 2 VIEW  Comparison: CT and chest radiograph, 05/01/2013  Findings: Dense consolidation persists at the left lung base silhouetting the left heart border.  This is consistent with both left upper lobe lingular consolidation and patchy areas of lower lobe consolidation.  The root mid and upper left lung is clear and the right lung is clear.  The cardiac silhouette is normal in size and configuration.  No mediastinal or hilar masses are appreciated.  There is no pleural effusion.  No pneumothorax is seen.  IMPRESSION: Persistent left upper lobe lingular consolidation and patchy left lower lobe consolidation consistent with pneumonia.  No significant change from the prior study.   Original Report Authenticated By: Amie Portland, M.D.   Ct Angio Chest W/cm &/or Wo Cm  05/01/2013   CLINICAL DATA:  Left chest pain,, productive cough, fever.  EXAM: CT ANGIOGRAPHY CHEST WITH CONTRAST  TECHNIQUE: Multidetector CT imaging of the chest was performed using the standard protocol during bolus administration of intravenous contrast. Multiplanar CT image reconstructions including  MIPs were obtained to evaluate the vascular anatomy.  CONTRAST:  OMNIPAQUE IOHEXOL 350 MG/ML SOLN  COMPARISON:  Chest x-ray earlier today  FINDINGS: Consolidation in the lingula and left lower lobe compatible with pneumonia. Minimal opacity in the right middle lobe could reflect early infiltrate/ pneumonia as well. There is a small pericardial effusion. Heart is borderline in size. Aorta is normal caliber. No mediastinal, hilar, or axillary adenopathy. Visualized thyroid and chest wall soft tissues  unremarkable. Trace bilateral pleural effusions.  No filling defects in the pulmonary arteries to suggest pulmonary emboli.  No acute bony abnormality. Imaging into the upper abdomen shows no acute findings.  Review of the MIP images confirms the above findings.  IMPRESSION: Consolidation in the lingula and left lower lobe, less pronounced in the right middle lobe. Findings compatible with multifocal pneumonia. Trace bilateral pleural effusions. Small pericardial effusion.  No pulmonary embolus.   Electronically Signed   By: Charlett Nose   On: 05/01/2013 15:44   Dg Chest Port 1 View  04/27/2013   *RADIOLOGY REPORT*  Clinical Data:  Chest pain  PORTABLE CHEST - 1 VIEW  Comparison: Portable exam 2046 hours without priors for comparison  Findings: Normal heart size, mediastinal contours, and pulmonary vascularity. Lungs clear. No pleural effusion or pneumothorax. Numerous EKG leads project over chest. Bones unremarkable.  IMPRESSION: No acute abnormalities.   Original Report Authenticated By: Ulyses Southward, M.D.   Dg Abd 2 Views  05/01/2013   *RADIOLOGY REPORT*  Clinical Data: Abdominal pain, pneumonia  ABDOMEN - 2 VIEW  Comparison: None.  Findings: Supine and left-side down decubitus radiographs show a nonobstructive bowel gas pattern. No free intraperitoneal air. Excreted contrast collects within the bladder and collecting systems.  IUD projects over the pelvis. No acute osseous finding.  IMPRESSION: Nonobstructive bowel gas pattern.   Original Report Authenticated By: Jearld Lesch, M.D.    Microbiology: Recent Results (from the past 240 hour(s))  RAPID STREP SCREEN     Status: None   Collection Time    04/27/13  8:45 PM      Result Value Range Status   Streptococcus, Group A Screen (Direct) NEGATIVE  NEGATIVE Final   Comment: (NOTE)     A Rapid Antigen test may result negative if the antigen level in the     sample is below the detection level of this test. The FDA has not     cleared this test  as a stand-alone test therefore the rapid antigen     negative result has reflexed to a Group A Strep culture.  CULTURE, GROUP A STREP     Status: None   Collection Time    04/27/13  8:45 PM      Result Value Range Status   Specimen Description THROAT   Final   Special Requests NONE   Final   Culture     Final   Value: No Beta Hemolytic Streptococci Isolated     Performed at [facility]   Report Status 04/29/2013 FINAL   Final  URINE CULTURE     Status: None   Collection Time    05/01/13 11:05 AM      Result Value Range Status   Specimen Description URINE, CLEAN CATCH   Final   Special Requests NONE   Final   Culture  Setup Time     Final   Value: 05/01/2013 15:42     Performed at [facility] Count  Final   Value: NO GROWTH     Performed at [facility]   Culture     Final   Value: NO GROWTH     Performed at [facility]   Report Status 05/02/2013 FINAL   Final  CULTURE, BLOOD (ROUTINE X 2)     Status: None   Collection Time    05/01/13  4:38 PM      Result Value Range Status   Specimen Description BLOOD HAND LEFT   Final   Special Requests BOTTLES DRAWN AEROBIC ONLY 3CC   Final   Culture  Setup Time     Final   Value: 05/01/2013 19:33     Performed at [facility]   Culture     Final   Value:        BLOOD CULTURE RECEIVED NO GROWTH TO DATE CULTURE WILL BE HELD FOR 5 DAYS BEFORE ISSUING A FINAL NEGATIVE REPORT     Performed at [facility]   Report Status PENDING   Incomplete  CULTURE, BLOOD (ROUTINE X 2)     Status: None   Collection Time    05/01/13  5:40 PM      Result Value Range Status   Specimen Description BLOOD HAND LEFT   Final   Special Requests BOTTLES DRAWN AEROBIC ONLY 3CC   Final   Culture  Setup Time     Final   Value: 05/02/2013 01:11     Performed at [facility]   Culture     Final   Value:        BLOOD CULTURE RECEIVED NO GROWTH TO DATE CULTURE WILL BE HELD FOR 5 DAYS BEFORE  ISSUING A FINAL NEGATIVE REPORT     Performed at [facility]   Report Status PENDING   Incomplete  CULTURE, EXPECTORATED SPUTUM-ASSESSMENT     Status: None   Collection Time    05/02/13  5:55 AM      Result Value Range Status   Specimen Description SPUTUM   Final   Special Requests Normal   Final   Sputum evaluation     Final   Value: THIS SPECIMEN IS ACCEPTABLE. RESPIRATORY CULTURE REPORT TO FOLLOW.   Report Status 05/02/2013 FINAL   Final  CULTURE, RESPIRATORY (NON-EXPECTORATED)     Status: None   Collection Time    05/02/13  5:55 AM      Result Value Range Status   Specimen Description SPUTUM   Final   Special Requests NONE   Final   Gram Stain     Final   Value: FEW WBC PRESENT,BOTH PMN AND MONONUCLEAR     RARE SQUAMOUS EPITHELIAL CELLS PRESENT     NO ORGANISMS SEEN     Performed at [facility]   Culture     Final   Value: NORMAL OROPHARYNGEAL FLORA     Performed at [facility]   Report Status 05/04/2013 FINAL   Final     Labs: Basic Metabolic Panel:  Recent Labs Lab 05/01/13 1125 05/02/13 0135 05/03/13 0735 05/04/13 0500 05/05/13 0505  NA 135 135 136 138 136  K 4.1 3.8 3.5 3.8 3.7  CL 97 99 101 102 100  CO2 23 25 26 24 26   GLUCOSE 112* 99 100* 93 96  BUN 9 6 6 6 7   CREATININE 0.58 0.54 0.48* 0.54 0.54  CALCIUM 9.1 8.7 8.9 8.8 8.8   Liver Function Tests:  Recent Labs Lab 05/01/13  1125  AST 11  ALT 16  ALKPHOS 84  BILITOT 0.7  PROT 6.7  ALBUMIN 3.3*    Recent Labs Lab 05/01/13 1956  LIPASE 8*   No results found for this basename: AMMONIA,  in the last 168 hours CBC:  Recent Labs Lab 05/01/13 1125 05/02/13 0135 05/03/13 0735 05/04/13 0500 05/05/13 0505  WBC 26.8* 21.0* 11.3* 7.3 5.9  HGB 13.0 12.0 11.7* 11.6* 12.4  HCT 38.0 34.7* 33.4* 34.0* 35.5*  MCV 87.6 88.1 87.4 87.0 85.7  PLT 241 217 243 343 355   Cardiac Enzymes:  Recent Labs Lab 05/01/13 1132 05/01/13 1956 05/02/13 0135  CKTOTAL 45  --   --    CKMB 1.2  --   --   TROPONINI <0.30 <0.30 <0.30   BNP: BNP (last 3 results) No results found for this basename: PROBNP,  in the last 8760 hours CBG: No results found for this basename: GLUCAP,  in the last 168 hours     Signed:  Darcell Sabino,CHRISTOPHER  [facility] 05/05/2013, 12:21 PM

## 2013-05-07 LAB — CULTURE, BLOOD (ROUTINE X 2): Culture: NO GROWTH

## 2013-05-08 LAB — CULTURE, BLOOD (ROUTINE X 2): Culture: NO GROWTH

## 2013-07-12 ENCOUNTER — Other Ambulatory Visit: Payer: Self-pay

## 2013-08-11 ENCOUNTER — Encounter (HOSPITAL_COMMUNITY): Payer: Self-pay | Admitting: Emergency Medicine

## 2013-08-11 ENCOUNTER — Emergency Department (HOSPITAL_COMMUNITY)
Admission: EM | Admit: 2013-08-11 | Discharge: 2013-08-11 | Disposition: A | Discharge status: Home / Self Care | Payer: Managed Care, Other (non HMO) | Attending: Emergency Medicine | Admitting: Emergency Medicine

## 2013-08-11 ENCOUNTER — Emergency Department (HOSPITAL_COMMUNITY): Payer: Managed Care, Other (non HMO)

## 2013-08-11 DIAGNOSIS — I10 Essential (primary) hypertension: Secondary | ICD-10-CM | POA: Insufficient documentation

## 2013-08-11 DIAGNOSIS — M545 Low back pain: Secondary | ICD-10-CM

## 2013-08-11 DIAGNOSIS — G8929 Other chronic pain: Secondary | ICD-10-CM | POA: Insufficient documentation

## 2013-08-11 DIAGNOSIS — Z8701 Personal history of pneumonia (recurrent): Secondary | ICD-10-CM | POA: Insufficient documentation

## 2013-08-11 DIAGNOSIS — Z8619 Personal history of other infectious and parasitic diseases: Secondary | ICD-10-CM | POA: Insufficient documentation

## 2013-08-11 DIAGNOSIS — Z87891 Personal history of nicotine dependence: Secondary | ICD-10-CM | POA: Insufficient documentation

## 2013-08-11 DIAGNOSIS — Y92410 Unspecified street and highway as the place of occurrence of the external cause: Secondary | ICD-10-CM | POA: Insufficient documentation

## 2013-08-11 DIAGNOSIS — Y9389 Activity, other specified: Secondary | ICD-10-CM | POA: Insufficient documentation

## 2013-08-11 DIAGNOSIS — IMO0002 Reserved for concepts with insufficient information to code with codable children: Secondary | ICD-10-CM | POA: Insufficient documentation

## 2013-08-11 DIAGNOSIS — Z79899 Other long term (current) drug therapy: Secondary | ICD-10-CM | POA: Insufficient documentation

## 2013-08-11 MED ORDER — NAPROXEN 375 MG PO TABS: 375.0000 mg | ORAL_TABLET | Freq: Two times a day (BID) | ORAL | Status: DC

## 2013-08-11 MED ORDER — CYCLOBENZAPRINE HCL 10 MG PO TABS: 10.0000 mg | ORAL_TABLET | Freq: Two times a day (BID) | ORAL | Status: DC | PRN

## 2013-08-11 MED ORDER — METHOCARBAMOL 500 MG PO TABS
500.0000 mg | ORAL_TABLET | Freq: Once | ORAL | Status: AC
Start: 2013-08-11 — End: 2013-08-11
  Administered 2013-08-11: 500 mg via ORAL
  Filled 2013-08-11: qty 1
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
## 2013-08-12 NOTE — ED Provider Notes (Signed)
Medical screening examination/treatment/procedure(s) were performed by non-physician practitioner and as supervising physician I was immediately available for consultation/collaboration.  EKG Interpretation   None        Martha K Linker, MD 08/12/13 0704 

## 2014-07-08 ENCOUNTER — Encounter (HOSPITAL_COMMUNITY): Payer: Self-pay | Admitting: Emergency Medicine

## 2014-09-09 ENCOUNTER — Encounter (HOSPITAL_COMMUNITY): Payer: Self-pay | Admitting: Cardiology

## 2014-09-09 ENCOUNTER — Emergency Department (HOSPITAL_COMMUNITY)
Admission: EM | Admit: 2014-09-09 | Discharge: 2014-09-09 | Disposition: A | Discharge status: Home / Self Care | Payer: Managed Care, Other (non HMO) | Attending: Emergency Medicine | Admitting: Emergency Medicine

## 2014-09-09 DIAGNOSIS — Z87891 Personal history of nicotine dependence: Secondary | ICD-10-CM | POA: Diagnosis not present

## 2014-09-09 DIAGNOSIS — N898 Other specified noninflammatory disorders of vagina: Secondary | ICD-10-CM | POA: Diagnosis not present

## 2014-09-09 DIAGNOSIS — K529 Noninfective gastroenteritis and colitis, unspecified: Secondary | ICD-10-CM | POA: Insufficient documentation

## 2014-09-09 DIAGNOSIS — R1030 Lower abdominal pain, unspecified: Secondary | ICD-10-CM | POA: Diagnosis present

## 2014-09-09 DIAGNOSIS — Z8619 Personal history of other infectious and parasitic diseases: Secondary | ICD-10-CM | POA: Insufficient documentation

## 2014-09-09 DIAGNOSIS — Z791 Long term (current) use of non-steroidal anti-inflammatories (NSAID): Secondary | ICD-10-CM | POA: Diagnosis not present

## 2014-09-09 DIAGNOSIS — Z3202 Encounter for pregnancy test, result negative: Secondary | ICD-10-CM | POA: Diagnosis not present

## 2014-09-09 DIAGNOSIS — Z8701 Personal history of pneumonia (recurrent): Secondary | ICD-10-CM | POA: Insufficient documentation

## 2014-09-09 DIAGNOSIS — G8929 Other chronic pain: Secondary | ICD-10-CM | POA: Diagnosis not present

## 2014-09-09 DIAGNOSIS — I10 Essential (primary) hypertension: Secondary | ICD-10-CM | POA: Insufficient documentation

## 2014-09-09 LAB — CBC WITH DIFFERENTIAL/PLATELET
Basophils Absolute: 0 10*3/uL (ref 0.0–0.1)
Basophils Relative: 0 % (ref 0–1)
Eosinophils Absolute: 0.1 10*3/uL (ref 0.0–0.7)
Eosinophils Relative: 1 % (ref 0–5)
HEMATOCRIT: 42.7 % (ref 36.0–46.0)
HEMOGLOBIN: 14.2 g/dL (ref 12.0–15.0)
LYMPHS ABS: 0.9 10*3/uL (ref 0.7–4.0)
LYMPHS PCT: 22 % (ref 12–46)
MCH: 29.9 pg (ref 26.0–34.0)
MCHC: 33.3 g/dL (ref 30.0–36.0)
MCV: 89.9 fL (ref 78.0–100.0)
MONO ABS: 0.5 10*3/uL (ref 0.1–1.0)
MONOS PCT: 11 % (ref 3–12)
NEUTROS ABS: 2.7 10*3/uL (ref 1.7–7.7)
Neutrophils Relative %: 66 % (ref 43–77)
Platelets: 199 10*3/uL (ref 150–400)
RBC: 4.75 MIL/uL (ref 3.87–5.11)
RDW: 13.5 % (ref 11.5–15.5)
WBC: 4.2 10*3/uL (ref 4.0–10.5)

## 2014-09-09 LAB — COMPREHENSIVE METABOLIC PANEL
ALK PHOS: 46 U/L (ref 39–117)
ALT: 12 U/L (ref 0–35)
AST: 16 U/L (ref 0–37)
Albumin: 4 g/dL (ref 3.5–5.2)
Anion gap: 10 (ref 5–15)
BUN: 13 mg/dL (ref 6–23)
CALCIUM: 8.8 mg/dL (ref 8.4–10.5)
CO2: 21 mmol/L (ref 19–32)
Chloride: 107 mEq/L (ref 96–112)
Creatinine, Ser: 0.67 mg/dL (ref 0.50–1.10)
GFR calc Af Amer: >90 mL/min (ref >90)
GFR calc non Af Amer: >90 mL/min (ref >90)
Glucose, Bld: 91 mg/dL (ref 70–99)
Potassium: 3.8 mmol/L (ref 3.5–5.1)
SODIUM: 138 mmol/L (ref 135–145)
TOTAL PROTEIN: 6.9 g/dL (ref 6.0–8.3)
Total Bilirubin: 1.1 mg/dL (ref 0.3–1.2)

## 2014-09-09 LAB — WET PREP, GENITAL
TRICH WET PREP: NONE SEEN
Yeast Wet Prep HPF POC: NONE SEEN

## 2014-09-09 LAB — URINALYSIS, ROUTINE W REFLEX MICROSCOPIC
GLUCOSE, UA: NEGATIVE mg/dL
HGB URINE DIPSTICK: NEGATIVE
KETONES UR: 15 mg/dL — AB
Leukocytes, UA: NEGATIVE
Nitrite: NEGATIVE
PH: 5.5 (ref 5.0–8.0)
PROTEIN: NEGATIVE mg/dL
Specific Gravity, Urine: 1.037 — ABNORMAL HIGH (ref 1.005–1.030)
Urobilinogen, UA: 0.2 mg/dL (ref 0.0–1.0)

## 2014-09-09 LAB — HIV ANTIBODY (ROUTINE TESTING W REFLEX): HIV 1&2 Ab, 4th Generation: NONREACTIVE

## 2014-09-09 LAB — PREGNANCY, URINE: PREG TEST UR: NEGATIVE

## 2014-09-09 MED ORDER — ONDANSETRON HCL 4 MG/2ML IJ SOLN
4.0000 mg | Freq: Once | INTRAMUSCULAR | Status: AC
  Administered 2014-09-09: 4 mg via INTRAVENOUS
  Filled 2014-09-09: qty 2

## 2014-09-09 MED ORDER — AZITHROMYCIN 250 MG PO TABS
1000.0000 mg | ORAL_TABLET | Freq: Once | ORAL | Status: AC
  Administered 2014-09-09: 1000 mg via ORAL
  Filled 2014-09-09: qty 4

## 2014-09-09 MED ORDER — ONDANSETRON HCL 8 MG PO TABS: 8.0000 mg | ORAL_TABLET | ORAL | Status: DC | PRN

## 2014-09-09 MED ORDER — SODIUM CHLORIDE 0.9 % IV BOLUS (SEPSIS)
1000.0000 mL | Freq: Once | INTRAVENOUS | Status: AC
  Administered 2014-09-09: 1000 mL via INTRAVENOUS

## 2014-09-09 MED ORDER — CEFTRIAXONE SODIUM 250 MG IJ SOLR
250.0000 mg | Freq: Once | INTRAMUSCULAR | Status: AC
  Administered 2014-09-09: 250 mg via INTRAMUSCULAR
  Filled 2014-09-09: qty 250

## 2014-09-09 MED ORDER — KETOROLAC TROMETHAMINE 30 MG/ML IJ SOLN
30.0000 mg | Freq: Once | INTRAMUSCULAR | Status: AC
  Administered 2014-09-09: 30 mg via INTRAVENOUS
  Filled 2014-09-09: qty 1

## 2014-09-09 MED ORDER — LIDOCAINE HCL (PF) 1 % IJ SOLN
2.0000 mL | Freq: Once | INTRAMUSCULAR | Status: AC
  Administered 2014-09-09: 2 mL
  Filled 2014-09-09: qty 5

## 2014-09-09 NOTE — ED Provider Notes (Signed)
CSN: [id number]     Arrival date & time 09/09/14  0753 History   First MD Initiated Contact with Patient 09/09/14 [phone]     Chief Complaint  Patient presents with  . Abdominal Pain     (Consider location/radiation/quality/duration/timing/severity/associated sxs/prior Treatment) HPI Comments: Patient is a 24 year old female who presents with complaints of abdominal cramping, vomiting, and diarrhea which started last night and has gone through until this morning. She denies any blood, fevers. She also reports burning with urination, vaginal discharge, and spotting for the past several days. She is on birth control pills and seriously doubt the possibility of being pregnant. She does report recent sexual activity with a new partner.  Patient is a 24 y.o. female presenting with abdominal pain. The history is provided by the patient.  Abdominal Pain Pain location:  Suprapubic Pain quality: cramping   Pain radiates to:  Does not radiate Pain severity:  Moderate Onset quality:  Sudden Duration:  12 hours Timing:  Constant Progression:  Worsening Chronicity:  New Relieved by:  Nothing Worsened by:  Movement, palpation and eating Ineffective treatments:  None tried Associated symptoms: diarrhea, dysuria, fatigue, vaginal bleeding, vaginal discharge and vomiting   Associated symptoms: no fever     Past Medical History  Diagnosis Date  . Chronic back pain   . Chlamydia   . H/O candidiasis   . H/O varicella   . Hypertension   . Chest pain on exertion     "the last couple of days; I've been doing alot of coughing" (05/01/2013)  . Pneumonia     "now's the first time" (05/01/2013)  . Shortness of breath     "all the time right now" (05/01/2013)  . Daily headache     "last few days; since I've been sick" (05/01/2013)   Past Surgical History  Procedure Laterality Date  . Intrauterine device insertion  03/2012   Family History  Problem Relation Age of Onset  . Hypertension Father   .  Diabetes Paternal Grandmother    History  Substance Use Topics  . Smoking status: Former Smoker -- 3 years    Quit date: 04/06/2011  . Smokeless tobacco: Never Used     Comment: 05/01/2013 "smoked Black N Milds"  . Alcohol Use: Yes     Comment: 05/01/2013 "none recently"   OB History    Gravida Para Term Preterm AB TAB SAB Ectopic Multiple Living   0 0 0 0 0 0 1     Review of Systems  Constitutional: Positive for fatigue. Negative for fever.  Gastrointestinal: Positive for vomiting, abdominal pain and diarrhea.  Genitourinary: Positive for dysuria, vaginal bleeding and vaginal discharge.  All other systems reviewed and are negative.     Allergies  Review of patient's allergies indicates no known allergies.  Home Medications   Prior to Admission medications   Medication Sig Start Date End Date Taking? Authorizing Provider  ibuprofen (ADVIL,MOTRIN) 200 MG tablet Take 400 mg by mouth every 6 (six) hours as needed.   Yes Historical Provider, MD  cyclobenzaprine (FLEXERIL) 10 MG tablet Take 1 tablet (10 mg total) by mouth 2 (two) times daily as needed for muscle spasms. Patient not taking: Reported on 09/09/2014 08/11/13   Marissa Sciacca, PA-C  naproxen (NAPROSYN) 375 MG tablet Take 1 tablet (375 mg total) by mouth 2 (two) times daily. Patient not taking: Reported on 09/09/2014 08/11/13   Marissa Sciacca, PA-C   BP 120/67 mmHg  Pulse 63  Temp(Src)  98.1 F (36.7 C) (Oral)  Resp 18  Wt 199 lb (90.266 kg)  SpO2 98% Physical Exam  Constitutional: She is oriented to person, place, and time. She appears well-developed and well-nourished. No distress.  HENT:  Head: Normocephalic and atraumatic.  Neck: Normal range of motion. Neck supple.  Cardiovascular: Normal rate and regular rhythm.  Exam reveals no gallop and no friction rub.   No murmur heard. Pulmonary/Chest: Effort normal and breath sounds normal. No respiratory distress. She has no wheezes.  Abdominal: Soft. Bowel  sounds are normal. She exhibits no distension. There is tenderness. There is no rebound and no guarding.  There is tenderness to palpation in the suprapubic region.  Genitourinary: Uterus normal. Vaginal discharge found.  There is a whitish vaginal discharge present. There is no bleeding. There is no adnexal tenderness or masses. There is no cervical motion tenderness.  Musculoskeletal: Normal range of motion.  Neurological: She is alert and oriented to person, place, and time.  Skin: Skin is warm and dry. She is not diaphoretic.  Nursing note and vitals reviewed.   ED Course  Procedures (including critical care time) Labs Review Labs Reviewed  WET PREP, GENITAL  GC/CHLAMYDIA PROBE AMP  COMPREHENSIVE METABOLIC PANEL  CBC WITH DIFFERENTIAL  URINALYSIS, ROUTINE W REFLEX MICROSCOPIC  PREGNANCY, URINE  HIV ANTIBODY (ROUTINE TESTING)    Imaging Review No results found.   EKG Interpretation None      MDM   Final diagnoses:  None    Patient presents with lower abdominal discomfort, nausea, vomiting, and diarrhea. She is also having vaginal discharge. Her discomfort and GI issues sound viral in nature. She was given IV fluids and Zofran and is feeling improved. Pelvic exam reveals many white cells, however no clues or trichomonas. She will be treated with Rocephin and Zithromax pending GC and Chlamydia testing. To return as needed for any problems.    Geoffery Lyons, MD 09/11/14 [phone]

## 2014-09-09 NOTE — ED Notes (Signed)
Pt reports that she started having abd pain and n/v last night into this morning.

## 2014-09-09 NOTE — Discharge Instructions (Signed)
Zofran as prescribed as needed for nausea.  We will call you if your cultures indicate you require further treatment.  Return to the emergency department if your symptoms substantially worsen or change.   Viral Gastroenteritis Viral gastroenteritis is also known as stomach flu. This condition affects the stomach and intestinal tract. It can cause sudden diarrhea and vomiting. The illness typically lasts 3 to 8 days. Most people develop an immune response that eventually gets rid of the virus. While this natural response develops, the virus can make you quite ill. CAUSES  Many different viruses can cause gastroenteritis, such as rotavirus or noroviruses. You can catch one of these viruses by consuming contaminated food or water. You may also catch a virus by sharing utensils or other personal items with an infected person or by touching a contaminated surface. SYMPTOMS  The most common symptoms are diarrhea and vomiting. These problems can cause a severe loss of body fluids (dehydration) and a body salt (electrolyte) imbalance. Other symptoms may include:  Fever.  Headache.  Fatigue.  Abdominal pain. DIAGNOSIS  Your caregiver can usually diagnose viral gastroenteritis based on your symptoms and a physical exam. A stool sample may also be taken to test for the presence of viruses or other infections. TREATMENT  This illness typically goes away on its own. Treatments are aimed at rehydration. The most serious cases of viral gastroenteritis involve vomiting so severely that you are not able to keep fluids down. In these cases, fluids must be given through an intravenous line (IV). HOME CARE INSTRUCTIONS   Drink enough fluids to keep your urine clear or pale yellow. Drink small amounts of fluids frequently and increase the amounts as tolerated.  Ask your caregiver for specific rehydration instructions.  Avoid:  Foods high in sugar.  Alcohol.  Carbonated  drinks.  Tobacco.  Juice.  Caffeine drinks.  Extremely hot or cold fluids.  Fatty, greasy foods.  Too much intake of anything at one time.  Dairy products until 24 to 48 hours after diarrhea stops.  You may consume probiotics. Probiotics are active cultures of beneficial bacteria. They may lessen the amount and number of diarrheal stools in adults. Probiotics can be found in yogurt with active cultures and in supplements.  Wash your hands well to avoid spreading the virus.  Only take over-the-counter or prescription medicines for pain, discomfort, or fever as directed by your caregiver. Do not give aspirin to children. Antidiarrheal medicines are not recommended.  Ask your caregiver if you should continue to take your regular prescribed and over-the-counter medicines.  Keep all follow-up appointments as directed by your caregiver. SEEK IMMEDIATE MEDICAL CARE IF:   You are unable to keep fluids down.  You do not urinate at least once every 6 to 8 hours.  You develop shortness of breath.  You notice blood in your stool or vomit. This may look like coffee grounds.  You have abdominal pain that increases or is concentrated in one small area (localized).  You have persistent vomiting or diarrhea.  You have a fever.  The patient is a child younger than 3 months, and he or she has a fever.  The patient is a child older than 3 months, and he or she has a fever and persistent symptoms.  The patient is a child older than 3 months, and he or she has a fever and symptoms suddenly get worse.  The patient is a baby, and he or she has no tears when crying. MAKE SURE  YOU:   Understand these instructions.  Will watch your condition.  Will get help right away if you are not doing well or get worse. Document Released: 08/23/2005 Document Revised: 11/15/2011 Document Reviewed: 06/09/2011 [facility] Patient Information 2015 [place], [place]. This information is not intended to replace  advice given to you by your health care provider. Make sure you discuss any questions you have with your health care provider.

## 2014-09-10 LAB — GC/CHLAMYDIA PROBE AMP
CT PROBE, AMP APTIMA: NEGATIVE
GC Probe RNA: NEGATIVE

## 2015-01-08 ENCOUNTER — Other Ambulatory Visit: Payer: Self-pay | Admitting: Certified Nurse Midwife

## 2015-01-08 ENCOUNTER — Encounter: Payer: Self-pay | Admitting: Certified Nurse Midwife

## 2015-01-08 ENCOUNTER — Ambulatory Visit (INDEPENDENT_AMBULATORY_CARE_PROVIDER_SITE_OTHER): Payer: Managed Care, Other (non HMO) | Admitting: Certified Nurse Midwife

## 2015-01-08 VITALS — BP 126/81 | HR 73 | Temp 98.2°F | Wt 166.0 lb

## 2015-01-08 DIAGNOSIS — Z01419 Encounter for gynecological examination (general) (routine) without abnormal findings: Secondary | ICD-10-CM

## 2015-01-08 DIAGNOSIS — R234 Changes in skin texture: Secondary | ICD-10-CM

## 2015-01-08 DIAGNOSIS — Z0000 Encounter for general adult medical examination without abnormal findings: Secondary | ICD-10-CM | POA: Diagnosis not present

## 2015-01-08 DIAGNOSIS — N72 Inflammatory disease of cervix uteri: Secondary | ICD-10-CM

## 2015-01-08 DIAGNOSIS — N6459 Other signs and symptoms in breast: Secondary | ICD-10-CM

## 2015-01-08 DIAGNOSIS — T839XXA Unspecified complication of genitourinary prosthetic device, implant and graft, initial encounter: Secondary | ICD-10-CM

## 2015-01-08 LAB — CBC WITH DIFFERENTIAL/PLATELET
BASOS ABS: 0 10*3/uL (ref 0.0–0.1)
BASOS PCT: 0 % (ref 0–1)
EOS ABS: 0.1 10*3/uL (ref 0.0–0.7)
Eosinophils Relative: 1 % (ref 0–5)
HCT: 41.1 % (ref 36.0–46.0)
HEMOGLOBIN: 13.5 g/dL (ref 12.0–15.0)
LYMPHS ABS: 2.1 10*3/uL (ref 0.7–4.0)
LYMPHS PCT: 38 % (ref 12–46)
MCH: 29.3 pg (ref 26.0–34.0)
MCHC: 32.8 g/dL (ref 30.0–36.0)
MCV: 89.2 fL (ref 78.0–100.0)
MONO ABS: 0.6 10*3/uL (ref 0.1–1.0)
MONOS PCT: 10 % (ref 3–12)
MPV: 10.7 fL (ref 8.6–12.4)
Neutro Abs: 2.8 10*3/uL (ref 1.7–7.7)
Neutrophils Relative %: 51 % (ref 43–77)
PLATELETS: 246 10*3/uL (ref 150–400)
RBC: 4.61 MIL/uL (ref 3.87–5.11)
RDW: 13.7 % (ref 11.5–15.5)
WBC: 5.5 10*3/uL (ref 4.0–10.5)

## 2015-01-08 LAB — COMPREHENSIVE METABOLIC PANEL
ALT: 9 U/L (ref 0–35)
AST: 10 U/L (ref 0–37)
Albumin: 4.3 g/dL (ref 3.5–5.2)
Alkaline Phosphatase: 42 U/L (ref 39–117)
BILIRUBIN TOTAL: 0.3 mg/dL (ref 0.2–1.2)
BUN: 13 mg/dL (ref 6–23)
CHLORIDE: 104 meq/L (ref 96–112)
CO2: 23 mEq/L (ref 19–32)
CREATININE: 0.56 mg/dL (ref 0.50–1.10)
Calcium: 8.7 mg/dL (ref 8.4–10.5)
Glucose, Bld: 75 mg/dL (ref 70–99)
Potassium: 4.3 mEq/L (ref 3.5–5.3)
Sodium: 138 mEq/L (ref 135–145)
Total Protein: 6.5 g/dL (ref 6.0–8.3)

## 2015-01-08 MED ORDER — TINIDAZOLE 500 MG PO TABS: 2.0000 g | ORAL_TABLET | Freq: Every day | ORAL | Status: DC

## 2015-01-08 NOTE — Progress Notes (Signed)
Patient ID: Pamela Klein, female   DOB: [birth year], 24 y.o.   MRN: [id number]    Subjective:     Pamela Klein is a 24 y.o. female here for a routine exam.  Current complaints: vaginal discharge with odor for several months. Irregular spotting with Mirena, does not check for strings.  Cousin recently dx with lung cancer.  Is not sure about breast mass on both breasts on the lower sternal boarder below areola.  Desires STD screening.  Period lasts about 5 days with some spotting afterwards uses about 4 super tampons/day.  Does complain of occasional cramping comes and goes bilateral lower abdominal quadrants.      Personal health questionnaire:  Is patient Ashkenazi Jewish, have a family history of breast and/or ovarian cancer: no Is there a family history of uterine cancer diagnosed at age < 4650, gastrointestinal cancer, urinary tract cancer, family member who is a Personnel officerLynch syndrome-associated carrier: no Is the patient overweight and hypertensive, family history of diabetes, personal history of gestational diabetes, preeclampsia or PCOS: no Is patient over 2355, have PCOS,  family history of premature CHD under age 24, diabetes, smoke, have hypertension or peripheral artery disease:  no At any time, has a partner hit, kicked or otherwise hurt or frightened you?: no Over the past 2 weeks, have you felt down, depressed or hopeless?: no Over the past 2 weeks, have you felt little interest or pleasure in doing things?:no   Gynecologic History No LMP recorded. Contraception: IUD Last Pap: unknown. Results were: unknown  Last mammogram: N/A.   Obstetric History OB History  Gravida Para Term Preterm AB SAB TAB Ectopic Multiple Living  1 1 1  0 0 0 0 0 0 1    # Outcome Date GA Lbr Len/2nd Weight Sex Delivery Anes PTL Lv  1 Term 02/27/12 [id number]   F Vag-Spont Local  Y      Past Medical History  Diagnosis Date  . Chronic back pain   . Chlamydia   . H/O candidiasis   . H/O varicella   .  Hypertension   . Chest pain on exertion     "the last couple of days; I've been doing alot of coughing" (05/01/2013)  . Pneumonia     "now's the first time" (05/01/2013)  . Shortness of breath     "all the time right now" (05/01/2013)  . Daily headache     "last few days; since I've been sick" (05/01/2013)    Past Surgical History  Procedure Laterality Date  . Intrauterine device insertion  03/2012     Current outpatient prescriptions:  .  ibuprofen (ADVIL,MOTRIN) 200 MG tablet, Take 400 mg by mouth every 6 (six) hours as needed., Disp: , Rfl:  .  cyclobenzaprine (FLEXERIL) 10 MG tablet, Take 1 tablet (10 mg total) by mouth 2 (two) times daily as needed for muscle spasms. (Patient not taking: Reported on 09/09/2014), Disp: 20 tablet, Rfl: 0 .  naproxen (NAPROSYN) 375 MG tablet, Take 1 tablet (375 mg total) by mouth 2 (two) times daily. (Patient not taking: Reported on 09/09/2014), Disp: 20 tablet, Rfl: 0 .  ondansetron (ZOFRAN) 8 MG tablet, Take 1 tablet (8 mg total) by mouth every 4 (four) hours as needed for nausea. (Patient not taking: Reported on 01/08/2015), Disp: 6 tablet, Rfl: 0 .  tinidazole (TINDAMAX) 500 MG tablet, Take 4 tablets (2,000 mg total) by mouth daily with breakfast., Disp: 12 tablet, Rfl: 0 No Known Allergies  History  Substance Use Topics  .  Smoking status: Former Smoker -- 3 years    Quit date: 04/06/2011  . Smokeless tobacco: Never Used     Comment: 05/01/2013 "smoked Black N Milds"  . Alcohol Use: 0.0 oz/week    0 Standard drinks or equivalent per week     Comment: 05/01/2013 "none recently"    Family History  Problem Relation Age of Onset  . Hypertension Father   . Diabetes Paternal Grandmother       Review of Systems  Constitutional: negative for fatigue and weight loss Respiratory: negative for cough and wheezing Cardiovascular: negative for chest pain, fatigue and palpitations Gastrointestinal: negative for abdominal pain and change in bowel  habits Musculoskeletal:negative for myalgias Neurological: negative for gait problems and tremors Behavioral/Psych: negative for abusive relationship, depression Endocrine: negative for temperature intolerance   Genitourinary:negative for abnormal menstrual periods, genital lesions, hot flashes, and sexual problems. + vaginal discharge Integument/breast: negative for breast tenderness, nipple discharge and skin lesion(s), +breast lump    Objective:       BP 126/81 mmHg  Pulse 73  Temp(Src) 98.2 F (36.8 C)  Wt 75.297 kg (166 lb) General:   alert  Skin:   no rash or abnormalities  Lungs:   clear to auscultation bilaterally  Heart:   regular rate and rhythm, S1, S2 normal, no murmur, click, rub or gallop  Breasts:   no skin or nipple changes or axillary nodes. + bilateral lower chest wall masses easily mobile, non-tender below areola.  Abdomen:  normal findings: no organomegaly, soft, non-tender and no hernia  Pelvis:  External genitalia: normal general appearance Urinary system: urethral meatus normal and bladder without fullness, nontender Vaginal: normal without tenderness, induration or masses Cervix: slight irritation, + yellow/green malodorous discharge, IUD strings not present on exam Adnexa: normal bimanual exam Uterus: anteverted and non-tender, normal size   Lab Review Urine pregnancy test Labs reviewed yes Radiologic studies reviewed no  50% of 30 min visit spent on counseling and coordination of care.   Assessment:    Healthy female exam.   Suspicious breast masses bilaterally under areola on chest wall Desires STD screening ?cervicitis vs BV   Plan:    Education reviewed: calcium supplements, depression evaluation, low fat, low cholesterol diet, safe sex/STD prevention, self breast exams, skin cancer screening and weight bearing exercise. Contraception: IUD. Mammogram ordered. Follow up in: 1 year.   Meds ordered this encounter  Medications  .  tinidazole (TINDAMAX) 500 MG tablet    Sig: Take 4 tablets (2,000 mg total) by mouth daily with breakfast.    Dispense:  12 tablet    Refill:  0   Orders Placed This Encounter  Procedures  . SureSwab, Vaginosis/Vaginitis Plus  . US Transvaginal Non-OB    Standing Status: Future     Number of Occurrences:      Standing Expiration Date: 03/09/2016    Order Specific Question:  Reason for Exam (SYMPTOM  OR DIAGNOSIS REQUIRED)    Answer:  unable to determine IUD location, strings not present on exam, + LQ pain bilaterally    Order Specific Question:  Preferred imaging location?    Answer:  Internal  . HIV antibody (with reflex)  . CBC with Differential/Platelet  . Comprehensive metabolic panel  . TSH  . Hepatitis B surface antigen  . RPR  . Hepatitis C antibody

## 2015-01-09 ENCOUNTER — Other Ambulatory Visit: Payer: Self-pay | Admitting: Certified Nurse Midwife

## 2015-01-09 ENCOUNTER — Ambulatory Visit (INDEPENDENT_AMBULATORY_CARE_PROVIDER_SITE_OTHER): Payer: Medicaid Other

## 2015-01-09 DIAGNOSIS — Z30431 Encounter for routine checking of intrauterine contraceptive device: Secondary | ICD-10-CM

## 2015-01-09 DIAGNOSIS — T839XXA Unspecified complication of genitourinary prosthetic device, implant and graft, initial encounter: Secondary | ICD-10-CM

## 2015-01-09 DIAGNOSIS — R102 Pelvic and perineal pain: Secondary | ICD-10-CM

## 2015-01-09 LAB — PAP IG W/ RFLX HPV ASCU

## 2015-01-09 LAB — RPR

## 2015-01-09 LAB — HEPATITIS C ANTIBODY: HCV Ab: NEGATIVE

## 2015-01-09 LAB — HEPATITIS B SURFACE ANTIGEN: Hepatitis B Surface Ag: NEGATIVE

## 2015-01-09 LAB — HIV ANTIBODY (ROUTINE TESTING W REFLEX): HIV: NONREACTIVE

## 2015-01-09 LAB — TSH: TSH: 0.674 u[IU]/mL (ref 0.350–4.500)

## 2015-01-10 ENCOUNTER — Other Ambulatory Visit: Payer: Managed Care, Other (non HMO)

## 2015-01-12 LAB — SURESWAB, VAGINOSIS/VAGINITIS PLUS
ATOPOBIUM VAGINAE: 6.8 Log (cells/mL)
C. ALBICANS, DNA: NOT DETECTED
C. PARAPSILOSIS, DNA: NOT DETECTED
C. TRACHOMATIS RNA, TMA: NOT DETECTED
C. glabrata, DNA: NOT DETECTED
C. tropicalis, DNA: NOT DETECTED
Gardnerella vaginalis: 7.2 Log (cells/mL)
LACTOBACILLUS SPECIES: NOT DETECTED Log (cells/mL)
MEGASPHAERA SPECIES: 7.2 Log (cells/mL)
N. GONORRHOEAE RNA, TMA: NOT DETECTED
T. vaginalis RNA, QL TMA: NOT DETECTED

## 2015-01-15 ENCOUNTER — Encounter: Payer: Self-pay | Admitting: *Deleted

## 2015-01-15 ENCOUNTER — Other Ambulatory Visit: Payer: Self-pay | Admitting: *Deleted

## 2015-01-15 DIAGNOSIS — B9689 Other specified bacterial agents as the cause of diseases classified elsewhere: Secondary | ICD-10-CM

## 2015-01-15 DIAGNOSIS — N72 Inflammatory disease of cervix uteri: Secondary | ICD-10-CM

## 2015-01-15 DIAGNOSIS — N760 Acute vaginitis: Secondary | ICD-10-CM

## 2015-01-15 MED ORDER — TINIDAZOLE 500 MG PO TABS: 1.0000 g | ORAL_TABLET | Freq: Every day | ORAL | Status: AC

## 2015-01-15 NOTE — Progress Notes (Unsigned)
Call to pt for verification of treatment.  Pt states that she has different pharmacy and they did not have on file to be transferred.  Pt made aware that Rx could be reordered at pharmacy of her choice.  Tinidazole reordered. Pt also ask for copy of her immunizations to be mailed to her.  Pt made aware we could mail her what immunization we have on file. Pt advised to contact office if any other problems with Rx.

## 2015-01-17 ENCOUNTER — Telehealth: Payer: Self-pay | Admitting: *Deleted

## 2015-01-17 NOTE — Telephone Encounter (Signed)
Placed call to pt.   LM on VM making her aware that her prescription has been approved by insurance.  Pt advised to f/u with pharmacy for approval in order to pick up Rx.  Advised to contact office if any other problems.

## 2015-02-04 ENCOUNTER — Other Ambulatory Visit: Payer: Managed Care, Other (non HMO)

## 2015-03-03 ENCOUNTER — Encounter: Payer: Self-pay | Admitting: Obstetrics

## 2015-03-03 ENCOUNTER — Ambulatory Visit (INDEPENDENT_AMBULATORY_CARE_PROVIDER_SITE_OTHER): Payer: Medicaid Other | Admitting: Obstetrics

## 2015-03-03 ENCOUNTER — Ambulatory Visit: Payer: Medicaid Other | Admitting: Obstetrics

## 2015-03-03 VITALS — BP 129/70 | HR 98 | Temp 97.7°F | Wt 170.0 lb

## 2015-03-03 DIAGNOSIS — B373 Candidiasis of vulva and vagina: Secondary | ICD-10-CM | POA: Diagnosis not present

## 2015-03-03 DIAGNOSIS — N760 Acute vaginitis: Secondary | ICD-10-CM | POA: Diagnosis not present

## 2015-03-03 DIAGNOSIS — A499 Bacterial infection, unspecified: Secondary | ICD-10-CM | POA: Diagnosis not present

## 2015-03-03 DIAGNOSIS — B3731 Acute candidiasis of vulva and vagina: Secondary | ICD-10-CM

## 2015-03-03 DIAGNOSIS — B9689 Other specified bacterial agents as the cause of diseases classified elsewhere: Secondary | ICD-10-CM

## 2015-03-03 MED ORDER — TINIDAZOLE 500 MG PO TABS: 1000.0000 mg | ORAL_TABLET | Freq: Every day | ORAL | Status: DC

## 2015-03-03 MED ORDER — FLUCONAZOLE 150 MG PO TABS: 150.0000 mg | ORAL_TABLET | Freq: Once | ORAL | Status: DC

## 2015-03-03 MED ORDER — CICLOPIROX OLAMINE 0.77 % EX CREA: TOPICAL_CREAM | Freq: Two times a day (BID) | CUTANEOUS | Status: DC

## 2015-03-03 NOTE — Progress Notes (Signed)
Patient ID: Pamela Klein, female   DOB: [birth year], 24 y.o.   MRN: [id number]  Chief Complaint  Patient presents with  . Vaginitis    HPI Pamela Klein is a 24 y.o. female.  Vaginal discharge with odor and irritation.  Denies pelvic pain, fever / chills or dysuria. HPI  Past Medical History  Diagnosis Date  . Chronic back pain   . Chlamydia   . H/O candidiasis   . H/O varicella   . Hypertension   . Chest pain on exertion     "the last couple of days; I've been doing alot of coughing" (05/01/2013)  . Pneumonia     "now's the first time" (05/01/2013)  . Shortness of breath     "all the time right now" (05/01/2013)  . Daily headache     "last few days; since I've been sick" (05/01/2013)    Past Surgical History  Procedure Laterality Date  . Intrauterine device insertion  03/2012    Family History  Problem Relation Age of Onset  . Hypertension Father   . Diabetes Paternal Grandmother     Social History History  Substance Use Topics  . Smoking status: Former Smoker -- 3 years    Quit date: 04/06/2011  . Smokeless tobacco: Never Used     Comment: 05/01/2013 "smoked Black N Milds"  . Alcohol Use: 0.0 oz/week    0 Standard drinks or equivalent per week     Comment: 05/01/2013 "none recently"    No Known Allergies  Current Outpatient Prescriptions  Medication Sig Dispense Refill  . ciclopirox (LOPROX) 0.77 % cream Apply topically 2 (two) times daily. 60 g 0  . cyclobenzaprine (FLEXERIL) 10 MG tablet Take 1 tablet (10 mg total) by mouth 2 (two) times daily as needed for muscle spasms. (Patient not taking: Reported on 09/09/2014) 20 tablet 0  . fluconazole (DIFLUCAN) 150 MG tablet Take 1 tablet (150 mg total) by mouth once. 1 tablet 2  . ibuprofen (ADVIL,MOTRIN) 200 MG tablet Take 400 mg by mouth every 6 (six) hours as needed.    . naproxen (NAPROSYN) 375 MG tablet Take 1 tablet (375 mg total) by mouth 2 (two) times daily. (Patient not taking: Reported on 09/09/2014) 20  tablet 0  . ondansetron (ZOFRAN) 8 MG tablet Take 1 tablet (8 mg total) by mouth every 4 (four) hours as needed for nausea. (Patient not taking: Reported on 01/08/2015) 6 tablet 0  . tinidazole (TINDAMAX) 500 MG tablet Take 2 tablets (1,000 mg total) by mouth daily with breakfast. 10 tablet 2   No current facility-administered medications for this visit.    Review of Systems Review of Systems Constitutional: negative for fatigue and weight loss Respiratory: negative for cough and wheezing Cardiovascular: negative for chest pain, fatigue and palpitations Gastrointestinal: negative for abdominal pain and change in bowel habits Genitourinary: malodorous vaginal discharge with itching  Integument/breast: negative for nipple discharge Musculoskeletal:negative for myalgias Neurological: negative for gait problems and tremors Behavioral/Psych: negative for abusive relationship, depression Endocrine: negative for temperature intolerance     Blood pressure 129/70, pulse 98, temperature 97.7 F (36.5 C), weight 170 lb (77.111 kg), last menstrual period 01/29/2015.  Physical Exam Physical Exam General:   alert  Skin:   perineal fungal - like rash  Lungs:   clear to auscultation bilaterally  Heart:   regular rate and rhythm, S1, S2 normal, no murmur, click, rub or gallop  Breasts:   normal without suspicious masses, skin or nipple changes or axillary  nodes  Abdomen:  normal findings: no organomegaly, soft, non-tender and no hernia  Pelvis:  External genitalia: fungal - like rash of vulva and perineum Urinary system: urethral meatus normal and bladder without fullness, nontender Vaginal: normal without tenderness, induration or masses.  Wallace CullensGray, thin discharge Cervix: normal appearance Adnexa: normal bimanual exam Uterus: anteverted and non-tender, normal size      Data Reviewed Wet prep  Assessment     BV Candida vulvovaginitis     Plan    Tindamax Rx Fluconazole Rx Ciclopirox  Rx F/U prn   Orders Placed This Encounter  Procedures  . SureSwab, Vaginosis/Vaginitis Plus   Meds ordered this encounter  Medications  . fluconazole (DIFLUCAN) 150 MG tablet    Sig: Take 1 tablet (150 mg total) by mouth once.    Dispense:  1 tablet    Refill:  2  . tinidazole (TINDAMAX) 500 MG tablet    Sig: Take 2 tablets (1,000 mg total) by mouth daily with breakfast.    Dispense:  10 tablet    Refill:  2  . ciclopirox (LOPROX) 0.77 % cream    Sig: Apply topically 2 (two) times daily.    Dispense:  60 g    Refill:  0

## 2015-03-04 ENCOUNTER — Other Ambulatory Visit: Payer: Managed Care, Other (non HMO)

## 2015-03-06 LAB — SURESWAB, VAGINOSIS/VAGINITIS PLUS
Atopobium vaginae: 7.7 Log (cells/mL)
C. albicans, DNA: NOT DETECTED
C. glabrata, DNA: NOT DETECTED
C. parapsilosis, DNA: NOT DETECTED
C. trachomatis RNA, TMA: NOT DETECTED
C. tropicalis, DNA: NOT DETECTED
Gardnerella vaginalis: >8 Log (cells/mL)
LACTOBACILLUS SPECIES: NOT DETECTED Log (cells/mL)
MEGASPHAERA SPECIES: >8 Log (cells/mL)
N. gonorrhoeae RNA, TMA: NOT DETECTED
T. vaginalis RNA, QL TMA: NOT DETECTED

## 2015-05-07 ENCOUNTER — Encounter: Payer: Self-pay | Admitting: Certified Nurse Midwife

## 2015-05-07 ENCOUNTER — Ambulatory Visit: Payer: Medicaid Other | Admitting: Certified Nurse Midwife

## 2015-05-07 ENCOUNTER — Ambulatory Visit (INDEPENDENT_AMBULATORY_CARE_PROVIDER_SITE_OTHER): Payer: Medicaid Other | Admitting: Certified Nurse Midwife

## 2015-05-07 VITALS — BP 159/94 | HR 104 | Temp 98.4°F | Ht 64.0 in | Wt 168.2 lb

## 2015-05-07 DIAGNOSIS — R109 Unspecified abdominal pain: Secondary | ICD-10-CM | POA: Diagnosis not present

## 2015-05-07 DIAGNOSIS — Z3200 Encounter for pregnancy test, result unknown: Secondary | ICD-10-CM

## 2015-05-07 DIAGNOSIS — A499 Bacterial infection, unspecified: Secondary | ICD-10-CM

## 2015-05-07 DIAGNOSIS — N760 Acute vaginitis: Secondary | ICD-10-CM

## 2015-05-07 DIAGNOSIS — B9689 Other specified bacterial agents as the cause of diseases classified elsewhere: Secondary | ICD-10-CM

## 2015-05-07 LAB — POCT URINE PREGNANCY: PREG TEST UR: NEGATIVE

## 2015-05-07 MED ORDER — CYCLOBENZAPRINE HCL 10 MG PO TABS: 10.0000 mg | ORAL_TABLET | Freq: Two times a day (BID) | ORAL | Status: DC | PRN

## 2015-05-07 MED ORDER — IBUPROFEN 800 MG PO TABS: 800.0000 mg | ORAL_TABLET | Freq: Three times a day (TID) | ORAL | Status: DC | PRN

## 2015-05-07 MED ORDER — METRONIDAZOLE 0.75 % VA GEL: 1.0000 | VAGINAL | Status: DC

## 2015-05-07 NOTE — Progress Notes (Signed)
Patient ID: Pamela Klein, female   DOB: 08-29-1991, 24 y.o.   MRN: [id number]   Chief Complaint  Patient presents with  . Menstrual Problem    HPI Pamela Klein is a 24 y.o. female.  Had had Mirena IUD placed in 2013.  Had has hx of light periods lasting 2-3 days with light spotting.  No spotting last month.  For the past 3 days is having lower abdominal pain.  Nauseated since last evening.  Last sexual intercourse was 4 days ago. Home pregnancy test 2-3 weeks ago was negative.  Discharge for the last 2 weeks yellow-green with slight odor. Denies any itching.  LMP 02/06/15.  Last pap smear 01/08/15.  States she is skipping meals d/t being very busy with school and work.  States that she eats healthy when she eats.  Has 2 loose stools a day.  States that her abdominal pain feels like cramping.      HPI  Past Medical History  Diagnosis Date  . Chronic back pain   . Chlamydia   . H/O candidiasis   . H/O varicella   . Hypertension   . Chest pain on exertion     "the last couple of days; I've been doing alot of coughing" (05/01/2013)  . Pneumonia     "now's the first time" (05/01/2013)  . Shortness of breath     "all the time right now" (05/01/2013)  . Daily headache     "last few days; since I've been sick" (05/01/2013)    Past Surgical History  Procedure Laterality Date  . Intrauterine device insertion  03/2012    Family History  Problem Relation Age of Onset  . Hypertension Father   . Diabetes Paternal Grandmother     Social History Social History  Substance Use Topics  . Smoking status: Former Smoker -- 3 years    Quit date: 04/06/2011  . Smokeless tobacco: Never Used     Comment: 05/01/2013 "smoked Black N Milds"  . Alcohol Use: 0.0 oz/week    0 Standard drinks or equivalent per week     Comment: 05/01/2013 "none recently"    No Known Allergies  Current Outpatient Prescriptions  Medication Sig Dispense Refill  . ciclopirox (LOPROX) 0.77 % cream Apply topically 2  (two) times daily. (Patient not taking: Reported on 05/07/2015) 60 g 0  . cyclobenzaprine (FLEXERIL) 10 MG tablet Take 1 tablet (10 mg total) by mouth 2 (two) times daily as needed for muscle spasms. 40 tablet 2  . fluconazole (DIFLUCAN) 150 MG tablet Take 1 tablet (150 mg total) by mouth once. (Patient not taking: Reported on 05/07/2015) 1 tablet 2  . ibuprofen (ADVIL,MOTRIN) 800 MG tablet Take 1 tablet (800 mg total) by mouth every 8 (eight) hours as needed. 90 tablet 4  . metroNIDAZOLE (METROGEL VAGINAL) 0.75 % vaginal gel Place 1 Applicatorful vaginally 2 (two) times a week. For 4-6 months. 70 g 4  . naproxen (NAPROSYN) 375 MG tablet Take 1 tablet (375 mg total) by mouth 2 (two) times daily. (Patient not taking: Reported on 09/09/2014) 20 tablet 0  . ondansetron (ZOFRAN) 8 MG tablet Take 1 tablet (8 mg total) by mouth every 4 (four) hours as needed for nausea. (Patient not taking: Reported on 01/08/2015) 6 tablet 0  . tinidazole (TINDAMAX) 500 MG tablet Take 2 tablets (1,000 mg total) by mouth daily with breakfast. (Patient not taking: Reported on 05/07/2015) 10 tablet 2   No current facility-administered medications for this visit.  Review of Systems Review of Systems Constitutional: negative for fatigue and weight loss Respiratory: negative for cough and wheezing Cardiovascular: negative for chest pain, fatigue and palpitations Gastrointestinal: negative for abdominal pain and change in bowel habits Genitourinary:+ Lower abdominal pain, + vaginal discharge with odor.  Integument/breast: negative for nipple discharge Musculoskeletal:negative for myalgias Neurological: negative for gait problems and tremors Behavioral/Psych: negative for abusive relationship, depression Endocrine: negative for temperature intolerance     Blood pressure 159/94, pulse 104, temperature 98.4 F (36.9 C), height 5\' 4"  (1.626 m), weight 168 lb 3.2 oz (76.295 kg), last menstrual period 02/06/2015.  Physical  Exam Physical Exam General:   alert  Skin:   no rash or abnormalities  Lungs:   clear to auscultation bilaterally  Heart:   regular rate and rhythm, S1, S2 normal, no murmur, click, rub or gallop  Breasts:   deferred  Abdomen:  normal findings: no organomegaly, soft, non-tender and no hernia. No guarding or rebound tenderness.    Pelvis:  External genitalia: normal general appearance Urinary system: urethral meatus normal and bladder without fullness, nontender Vaginal: normal without tenderness, induration or masses Cervix: no CMT, short strings present with digital exam in cervix.   Adnexa: normal bimanual exam Uterus: anteverted and non-tender, normal size    50% of 15 min visit spent on counseling and coordination of care.   Data Reviewed Previous medical hx, labs, meds  Assessment     Chronic BV abdominal pain d/t cramping     Plan    Orders Placed This Encounter  Procedures  . POCT urine pregnancy   Meds ordered this encounter  Medications  . ibuprofen (ADVIL,MOTRIN) 800 MG tablet    Sig: Take 1 tablet (800 mg total) by mouth every 8 (eight) hours as needed.    Dispense:  90 tablet    Refill:  4  . DISCONTD: cyclobenzaprine (FLEXERIL) 10 MG tablet    Sig: Take 1 tablet (10 mg total) by mouth 2 (two) times daily as needed for muscle spasms.    Dispense:  40 tablet    Refill:  2  . metroNIDAZOLE (METROGEL VAGINAL) 0.75 % vaginal gel    Sig: Place 1 Applicatorful vaginally 2 (two) times a week. For 4-6 months.    Dispense:  70 g    Refill:  4  . cyclobenzaprine (FLEXERIL) 10 MG tablet    Sig: Take 1 tablet (10 mg total) by mouth 2 (two) times daily as needed for muscle spasms.    Dispense:  40 tablet    Refill:  2     Follow up as needed.

## 2015-05-10 LAB — SURESWAB, VAGINOSIS/VAGINITIS PLUS
Atopobium vaginae: 7.1 Log (cells/mL)
BV CATEGORY: UNDETERMINED — AB
C. GLABRATA, DNA: NOT DETECTED
C. PARAPSILOSIS, DNA: NOT DETECTED
C. albicans, DNA: NOT DETECTED
C. trachomatis RNA, TMA: NOT DETECTED
C. tropicalis, DNA: NOT DETECTED
Gardnerella vaginalis: >8 Log (cells/mL)
LACTOBACILLUS SPECIES: 5 Log (cells/mL)
MEGASPHAERA SPECIES: 7.9 Log (cells/mL)
N. GONORRHOEAE RNA, TMA: NOT DETECTED
T. VAGINALIS RNA, QL TMA: NOT DETECTED

## 2015-05-13 ENCOUNTER — Other Ambulatory Visit: Payer: Self-pay | Admitting: Certified Nurse Midwife

## 2015-08-02 ENCOUNTER — Inpatient Hospital Stay (HOSPITAL_COMMUNITY)
Admission: AD | Admit: 2015-08-02 | Discharge: 2015-08-02 | Disposition: A | Discharge status: Home / Self Care | Payer: Medicaid Other | Source: Ambulatory Visit | Attending: Obstetrics | Admitting: Obstetrics

## 2015-08-02 ENCOUNTER — Encounter (HOSPITAL_COMMUNITY): Payer: Self-pay

## 2015-08-02 DIAGNOSIS — N898 Other specified noninflammatory disorders of vagina: Secondary | ICD-10-CM | POA: Diagnosis not present

## 2015-08-02 DIAGNOSIS — J028 Acute pharyngitis due to other specified organisms: Secondary | ICD-10-CM

## 2015-08-02 DIAGNOSIS — J029 Acute pharyngitis, unspecified: Secondary | ICD-10-CM | POA: Diagnosis not present

## 2015-08-02 DIAGNOSIS — B9789 Other viral agents as the cause of diseases classified elsewhere: Secondary | ICD-10-CM

## 2015-08-02 LAB — URINALYSIS, ROUTINE W REFLEX MICROSCOPIC
BILIRUBIN URINE: NEGATIVE
GLUCOSE, UA: NEGATIVE mg/dL
Hgb urine dipstick: NEGATIVE
KETONES UR: NEGATIVE mg/dL
Leukocytes, UA: NEGATIVE
Nitrite: NEGATIVE
PH: 6 (ref 5.0–8.0)
PROTEIN: NEGATIVE mg/dL
Specific Gravity, Urine: >1.03 — ABNORMAL HIGH (ref 1.005–1.030)

## 2015-08-02 LAB — RAPID STREP SCREEN (MED CTR MEBANE ONLY): Streptococcus, Group A Screen (Direct): NEGATIVE

## 2015-08-02 LAB — POCT PREGNANCY, URINE: Preg Test, Ur: NEGATIVE

## 2015-08-02 NOTE — MAU Note (Signed)
Pt presents to MAU with complaints of sore throat, chest congestion and vaginal discharge

## 2015-08-02 NOTE — Discharge Instructions (Signed)
You most likely have a viral infection that will resolve on its own over time. A step throat swab has been done and results should be back in 1-2 days. Symptoms typically last 3-7 days but can stretch out to 2-3 weeks. You can use afrin for up to 5 days may help with congestion, mucinex or robitussin DM for cough., and sudafed if you dont have high blood pressure.  Unfortunately, antibiotics are not helpful for viral infections. Drink plenty of fluids and stay hydrated! Wash your hands frequently.  Sore Throat A sore throat is pain, burning, irritation, or scratchiness of the throat. There is often pain or tenderness when swallowing or talking. A sore throat may be accompanied by other symptoms, such as coughing, sneezing, fever, and swollen neck glands. A sore throat is often the first sign of another sickness, such as a cold, flu, strep throat, or mononucleosis (commonly known as mono). Most sore throats go away without medical treatment. CAUSES  The most common causes of a sore throat include:  A viral infection, such as a cold, flu, or mono.  A bacterial infection, such as strep throat, tonsillitis, or whooping cough.  Seasonal allergies.  Dryness in the air.  Irritants, such as smoke or pollution.  Gastroesophageal reflux disease (GERD). HOME CARE INSTRUCTIONS   Only take over-the-counter medicines as directed by your caregiver. (Sucrets, Chloraseptic)   Drink enough fluids to keep your urine clear or pale yellow.  Rest as needed.  Try using throat sprays, lozenges, or sucking on hard candy to ease any pain (if older than 4 years or as directed).  Sip warm liquids, such as broth, herbal tea, or warm water with honey to relieve pain temporarily. You may also eat or drink cold or frozen liquids such as frozen ice pops.  Gargle with salt water (mix 1 tsp salt with 8 oz of water).  Do not smoke and avoid secondhand smoke.  Put a cool-mist humidifier in your bedroom at night to  moisten the air. You can also turn on a hot shower and sit in the bathroom with the door closed for 5-10 minutes. SEEK IMMEDIATE MEDICAL CARE IF:  You have difficulty breathing.  You are unable to swallow fluids, soft foods, or your saliva.  You have increased swelling in the throat.  Your sore throat does not get better in 7 days.  You have nausea and vomiting.  You have a fever or persistent symptoms for more than 2-3 days.  You have a fever and your symptoms suddenly get worse. MAKE SURE YOU:   Understand these instructions.  Will watch your condition.  Will get help right away if you are not doing well or get worse.   This information is not intended to replace advice given to you by your health care provider. Make sure you discuss any questions you have with your health care provider.   Document Released: 09/30/2004 Document Revised: 09/13/2014 Document Reviewed: 04/30/2012 Elsevier Interactive Patient Education [employer]2016 Elsevier Inc.

## 2015-08-02 NOTE — MAU Provider Note (Signed)
Chief Complaint: Sore Throat; chest congestion ; and Vaginal Discharge   First Provider Initiated Contact with Patient 08/02/15 1322     SUBJECTIVE HPI: Pamela Klein is a 24 y.o. [id number] female who presents to [facility] reporting sore throat and congestion.  Location: Throat Quality: Soreness,  Severity: 10/10 on pain scale Duration: 2 days Context: None Timing: Constant Modifying factors: Hasn't tried anything for pain Associated signs and symptoms: Positive for nasal and chest congestion. Negative for fever, chills, cough.   Past Medical History  Diagnosis Date  . Chronic back pain   . Chlamydia   . H/O candidiasis   . H/O varicella   . Hypertension   . Chest pain on exertion   . Pneumonia   . Headache    OB History  Gravida Para Term Preterm AB SAB TAB Ectopic Multiple Living  0 0 0 0 0 0 1    # Outcome Date GA Lbr Len/2nd Weight Sex Delivery Anes PTL Lv  1 Term 02/27/12 [redacted]w[redacted]d   F Vag-Spont Local  Y     Past Surgical History  Procedure Laterality Date  . Intrauterine device insertion  03/2012   Social History   Social History  . Marital Status: Single    Spouse Name: N/A  . Number of Children: N/A  . Years of Education: N/A   Occupational History  . Not on file.   Social History Main Topics  . Smoking status: Former Smoker -- 3 years    Quit date: 04/06/2011  . Smokeless tobacco: Never Used     Comment: 05/01/2013 "smoked Black N Milds"  . Alcohol Use: 0.0 oz/week    0 Standard drinks or equivalent per week     Comment: 05/01/2013 "none recently"  . Drug Use: No  . Sexual Activity: Not Currently    Birth Control/ Protection: IUD     Comment: IUD    Other Topics Concern  . Not on file   Social History Narrative   No current facility-administered medications on file prior to encounter.   Current Outpatient Prescriptions on File Prior to Encounter  Medication Sig Dispense Refill  . ciclopirox (LOPROX) 0.77 % cream Apply  topically 2 (two) times daily. (Patient not taking: Reported on 05/07/2015) 60 g 0  . cyclobenzaprine (FLEXERIL) 10 MG tablet Take 1 tablet (10 mg total) by mouth 2 (two) times daily as needed for muscle spasms. (Patient not taking: Reported on 08/02/2015) 40 tablet 2  . fluconazole (DIFLUCAN) 150 MG tablet Take 1 tablet (150 mg total) by mouth once. (Patient not taking: Reported on 05/07/2015) 1 tablet 2  . ibuprofen (ADVIL,MOTRIN) 800 MG tablet Take 1 tablet (800 mg total) by mouth every 8 (eight) hours as needed. 90 tablet 4  . metroNIDAZOLE (METROGEL VAGINAL) 0.75 % vaginal gel Place 1 Applicatorful vaginally 2 (two) times a week. For 4-6 months. (Patient not taking: Reported on 08/02/2015) 70 g 4  . naproxen (NAPROSYN) 375 MG tablet Take 1 tablet (375 mg total) by mouth 2 (two) times daily. (Patient not taking: Reported on 09/09/2014) 20 tablet 0  . ondansetron (ZOFRAN) 8 MG tablet Take 1 tablet (8 mg total) by mouth every 4 (four) hours as needed for nausea. (Patient not taking: Reported on 01/08/2015) 6 tablet 0  . tinidazole (TINDAMAX) 500 MG tablet Take 2 tablets (1,000 mg total) by mouth daily with breakfast. (Patient not taking: Reported on 05/07/2015) 10 tablet 2   No Known Allergies  I have reviewed  the past Medical Hx, Surgical Hx, Social Hx, Allergies and Medications.   Review of Systems  Constitutional: Negative for fever and chills.  HENT: Positive for congestion, rhinorrhea and sore throat. Negative for ear pain and sinus pressure.   Respiratory: Negative for cough.   Gastrointestinal: Negative for nausea, vomiting and diarrhea.  Genitourinary: Positive for vaginal discharge.    OBJECTIVE Patient Vitals for the past 24 hrs:  BP Temp Pulse Resp SpO2 Height Weight  08/02/15 1213 - - - - -  (1.626 m) 168 lb (76.204 kg)  08/02/15 1146 121/67 mmHg 98.2 F (36.8 C) 79 18 100 % - -   Constitutional: Well-developed, well-nourished female in mild distress. Ill-appearing. Head:  Throat erythematous, otherwise normal. Cardiovascular: normal rate Respiratory: normal rate and effort.  Neurologic: Alert and oriented x 4.  GU: Declined pelvic exam.  LAB RESULTS Results for orders placed or performed during the hospital encounter of 08/02/15 (from the past 24 hour(s))  Urinalysis, Routine w reflex microscopic (not at [facility])     Status: Abnormal   Collection Time: 08/02/15 12:08 PM  Result Value Ref Range   Color, Urine YELLOW YELLOW   APPearance HAZY (A) CLEAR   Specific Gravity, Urine >1.030 (H) 1.005 - 1.030   pH 6.0 5.0 - 8.0   Glucose, UA NEGATIVE NEGATIVE mg/dL   Hgb urine dipstick NEGATIVE NEGATIVE   Bilirubin Urine NEGATIVE NEGATIVE   Ketones, ur NEGATIVE NEGATIVE mg/dL   Protein, ur NEGATIVE NEGATIVE mg/dL   Nitrite NEGATIVE NEGATIVE   Leukocytes, UA NEGATIVE NEGATIVE  Pregnancy, urine POC     Status: None   Collection Time: 08/02/15 12:10 PM  Result Value Ref Range   Preg Test, Ur NEGATIVE NEGATIVE    IMAGING No results found.  MAU COURSE Strep A swab. Offered wet prep, GC/chlamydia cultures. Patient refused.  MDM 24 year old nonpregnant female here with two-day history of sore throat, nasal congestion and chest congestion and no fever consistent with viral upper respiratory infection. Doubt strep a or influenza, but strep a swab sent.  ASSESSMENT 1. Acute viral pharyngitis     PLAN Discharge home in stable condition. Push fluids, peppermint tea. Increase rest. Ibuprofen, Sucrets as needed for pain.     Follow-up Information    Follow up with Primary care provider.   Why:  As needed if symptoms worsen (fever greater thet 100.4)      Follow up with [facility].   Specialty:  Emergency Medicine   Why:  As needed in emergencies   Contact information:   [address] [id number] mc [place] [address] [phone]       Medication List    STOP taking these medications         ciclopirox 0.77 % cream  Commonly known as:  LOPROX     cyclobenzaprine 10 MG tablet  Commonly known as:  FLEXERIL     fluconazole 150 MG tablet  Commonly known as:  DIFLUCAN     metroNIDAZOLE 0.75 % vaginal gel  Commonly known as:  METROGEL VAGINAL     naproxen 375 MG tablet  Commonly known as:  NAPROSYN     ondansetron 8 MG tablet  Commonly known as:  ZOFRAN      TAKE these medications        ibuprofen 800 MG tablet  Commonly known as:  ADVIL,MOTRIN  Take 1 tablet (800 mg total) by mouth every 8 (eight) hours as needed.  ASK your doctor about these medications        tinidazole 500 MG tablet  Commonly known as:  TINDAMAX  Take 2 tablets (1,000 mg total) by mouth daily with breakfast.         Dorathy KinsmanVirginia Apollo Timothy, CNM 08/02/2015  1:51 PM

## 2015-08-04 LAB — CULTURE, GROUP A STREP: Strep A Culture: NEGATIVE

## 2015-10-09 ENCOUNTER — Ambulatory Visit (INDEPENDENT_AMBULATORY_CARE_PROVIDER_SITE_OTHER): Payer: Medicaid Other | Admitting: Obstetrics

## 2015-10-09 ENCOUNTER — Ambulatory Visit: Payer: Medicaid Other | Admitting: Certified Nurse Midwife

## 2015-10-09 ENCOUNTER — Encounter: Payer: Self-pay | Admitting: Certified Nurse Midwife

## 2015-10-09 VITALS — BP 133/87 | HR 75 | Temp 98.7°F | Ht 64.0 in | Wt 168.0 lb

## 2015-10-09 DIAGNOSIS — N760 Acute vaginitis: Secondary | ICD-10-CM

## 2015-10-09 DIAGNOSIS — A499 Bacterial infection, unspecified: Secondary | ICD-10-CM | POA: Diagnosis not present

## 2015-10-09 DIAGNOSIS — B9689 Other specified bacterial agents as the cause of diseases classified elsewhere: Secondary | ICD-10-CM

## 2015-10-09 MED ORDER — METRONIDAZOLE 500 MG PO TABS: 500.0000 mg | ORAL_TABLET | Freq: Two times a day (BID) | ORAL | Status: DC

## 2015-10-11 ENCOUNTER — Encounter: Payer: Self-pay | Admitting: Obstetrics

## 2015-10-11 NOTE — Progress Notes (Signed)
Patient ID: Pamela Klein, female   DOB: Jan 26, 1991, 25 y.o.   MRN: [id number]  Chief Complaint  Patient presents with  . Gynecologic Exam    Patient complians of increased discharge. Patient has tried OTC treatments without success.    HPI Pamela Klein is a 25 y.o. female.  Vaginal discharge.  HPI  Past Medical History  Diagnosis Date  . Chronic back pain   . Chlamydia   . H/O candidiasis   . H/O varicella   . Hypertension   . Chest pain on exertion     "the last couple of days; I've been doing alot of coughing" (05/01/2013)  . Pneumonia     "now's the first time" (05/01/2013)  . Shortness of breath     "all the time right now" (05/01/2013)  . Daily headache     "last few days; since I've been sick" (05/01/2013)    Past Surgical History  Procedure Laterality Date  . Intrauterine device insertion  03/2012    Family History  Problem Relation Age of Onset  . Hypertension Father   . Diabetes Paternal Grandmother     Social History Social History  Substance Use Topics  . Smoking status: Former Smoker -- 3 years    Quit date: 04/06/2011  . Smokeless tobacco: Never Used     Comment: 05/01/2013 "smoked Black N Milds"  . Alcohol Use: 0.0 oz/week    0 Standard drinks or equivalent per week     Comment: 05/01/2013 "none recently" -occasional    No Known Allergies  Current Outpatient Prescriptions  Medication Sig Dispense Refill  . ibuprofen (ADVIL,MOTRIN) 800 MG tablet Take 1 tablet (800 mg total) by mouth every 8 (eight) hours as needed. 90 tablet 4  . levonorgestrel (MIRENA) 20 MCG/24HR IUD 1 each by Intrauterine route once.    . metroNIDAZOLE (FLAGYL) 500 MG tablet Take 1 tablet (500 mg total) by mouth 2 (two) times daily. 14 tablet 4   No current facility-administered medications for this visit.    Review of Systems Review of Systems Constitutional: negative for fatigue and weight loss Respiratory: negative for cough and wheezing Cardiovascular: negative  for chest pain, fatigue and palpitations Gastrointestinal: negative for abdominal pain and change in bowel habits Genitourinary: vaginal discharge Integument/breast: negative for nipple discharge Musculoskeletal:negative for myalgias Neurological: negative for gait problems and tremors Behavioral/Psych: negative for abusive relationship, depression Endocrine: negative for temperature intolerance     Blood pressure 133/87, pulse 75, temperature 98.7 F (37.1 C), height  (1.626 m), weight 168 lb (76.204 kg), last menstrual period 10/02/2015.  Physical Exam Physical Exam General:   alert  Skin:   no rash or abnormalities  Lungs:   clear to auscultation bilaterally  Heart:   regular rate and rhythm, S1, S2 normal, no murmur, click, rub or gallop  Breasts:   normal without suspicious masses, skin or nipple changes or axillary nodes  Abdomen:  normal findings: no organomegaly, soft, non-tender and no hernia  Pelvis:  External genitalia: normal general appearance Urinary system: urethral meatus normal and bladder without fullness, nontender Vaginal: normal without tenderness, induration or masses.  Thin, grey discharge Cervix: normal appearance Adnexa: normal bimanual exam Uterus: anteverted and non-tender, normal size      Data Reviewed Labs  Assessment     Vaginal discharge.  Probably BV.     Plan    Wet prep and cultures sent. Flagyl Rx. F/U prn.  Orders Placed This Encounter  Procedures  . SureSwab, Vaginosis/Vaginitis  Plus   Meds ordered this encounter  Medications  . levonorgestrel (MIRENA) 20 MCG/24HR IUD    Sig: 1 each by Intrauterine route once.  . metroNIDAZOLE (FLAGYL) 500 MG tablet    Sig: Take 1 tablet (500 mg total) by mouth 2 (two) times daily.    Dispense:  14 tablet    Refill:  4

## 2015-10-14 ENCOUNTER — Other Ambulatory Visit: Payer: Self-pay | Admitting: Obstetrics

## 2015-10-14 LAB — SURESWAB, VAGINOSIS/VAGINITIS PLUS
Atopobium vaginae: 6.3 Log (cells/mL)
BV CATEGORY: UNDETERMINED — AB
C. TROPICALIS, DNA: NOT DETECTED
C. albicans, DNA: NOT DETECTED
C. glabrata, DNA: NOT DETECTED
C. parapsilosis, DNA: NOT DETECTED
C. trachomatis RNA, TMA: NOT DETECTED
GARDNERELLA VAGINALIS: 7.8 Log (cells/mL)
LACTOBACILLUS SPECIES: 6.5 Log (cells/mL)
MEGASPHAERA SPECIES: 7.3 Log (cells/mL)
N. gonorrhoeae RNA, TMA: NOT DETECTED
T. vaginalis RNA, QL TMA: NOT DETECTED

## 2015-11-15 ENCOUNTER — Emergency Department (HOSPITAL_COMMUNITY)
Admission: EM | Admit: 2015-11-15 | Discharge: 2015-11-15 | Disposition: A | Discharge status: Home / Self Care | Payer: No Typology Code available for payment source | Attending: Emergency Medicine | Admitting: Emergency Medicine

## 2015-11-15 ENCOUNTER — Encounter (HOSPITAL_COMMUNITY): Payer: Self-pay | Admitting: *Deleted

## 2015-11-15 ENCOUNTER — Emergency Department (HOSPITAL_COMMUNITY): Payer: No Typology Code available for payment source

## 2015-11-15 DIAGNOSIS — Z87891 Personal history of nicotine dependence: Secondary | ICD-10-CM | POA: Insufficient documentation

## 2015-11-15 DIAGNOSIS — I10 Essential (primary) hypertension: Secondary | ICD-10-CM | POA: Insufficient documentation

## 2015-11-15 DIAGNOSIS — G8929 Other chronic pain: Secondary | ICD-10-CM | POA: Insufficient documentation

## 2015-11-15 DIAGNOSIS — Z8701 Personal history of pneumonia (recurrent): Secondary | ICD-10-CM | POA: Insufficient documentation

## 2015-11-15 DIAGNOSIS — Z792 Long term (current) use of antibiotics: Secondary | ICD-10-CM | POA: Insufficient documentation

## 2015-11-15 DIAGNOSIS — Z8619 Personal history of other infectious and parasitic diseases: Secondary | ICD-10-CM | POA: Insufficient documentation

## 2015-11-15 DIAGNOSIS — J069 Acute upper respiratory infection, unspecified: Secondary | ICD-10-CM | POA: Insufficient documentation

## 2015-11-15 LAB — RAPID STREP SCREEN (MED CTR MEBANE ONLY): STREPTOCOCCUS, GROUP A SCREEN (DIRECT): NEGATIVE

## 2015-11-15 MED ORDER — BENZONATATE 100 MG PO CAPS: 100.0000 mg | ORAL_CAPSULE | Freq: Three times a day (TID) | ORAL | Status: DC

## 2015-11-15 MED ORDER — BENZONATATE 100 MG PO CAPS
100.0000 mg | ORAL_CAPSULE | Freq: Once | ORAL | Status: AC
  Administered 2015-11-15: 100 mg via ORAL
  Filled 2015-11-15: qty 1

## 2015-11-15 MED ORDER — MAGIC MOUTHWASH
10.0000 mL | Freq: Once | ORAL | Status: AC
  Administered 2015-11-15: 10 mL via ORAL
  Filled 2015-11-15: qty 10

## 2015-11-15 MED ORDER — IBUPROFEN 800 MG PO TABS: 800.0000 mg | ORAL_TABLET | Freq: Three times a day (TID) | ORAL | Status: DC

## 2015-11-15 MED ORDER — IBUPROFEN 800 MG PO TABS
800.0000 mg | ORAL_TABLET | Freq: Once | ORAL | Status: AC
  Administered 2015-11-15: 800 mg via ORAL
  Filled 2015-11-15: qty 1

## 2015-11-15 MED ORDER — MAGIC MOUTHWASH W/LIDOCAINE: 5.0000 mL | Freq: Three times a day (TID) | ORAL | Status: DC | PRN

## 2015-11-15 NOTE — ED Provider Notes (Signed)
CSN: [id number]     Arrival date & time 11/15/15  0820 History   First MD Initiated Contact with Patient 11/15/15 1004     Chief Complaint  Patient presents with  . Sore Throat  . Generalized Body Aches     HPI  Pamela Klein is a 25 y.o. female with a PMH of HTN who presents to the ED with nasal congestion, sore throat, cough productive of yellow-green sputum, and generalized body aches. She states her symptoms started yesterday and have been constant since that time. She denies exacerbating factors. She notes she has been taking dayquil and nyquil with no significant symptom relief. She denies fever or chills. She reports she was recently in contact with an individual diagnosed with the flu. She states she thinks she received her flu vaccine this year.   Past Medical History  Diagnosis Date  . Chronic back pain   . Chlamydia   . H/O candidiasis   . H/O varicella   . Hypertension   . Chest pain on exertion     "the last couple of days; I've been doing alot of coughing" (05/01/2013)  . Pneumonia     "now's the first time" (05/01/2013)  . Shortness of breath     "all the time right now" (05/01/2013)  . Daily headache     "last few days; since I've been sick" (05/01/2013)   Past Surgical History  Procedure Laterality Date  . Intrauterine device insertion  03/2012   Family History  Problem Relation Age of Onset  . Hypertension Father   . Diabetes Paternal Grandmother    Social History  Substance Use Topics  . Smoking status: Former Smoker -- 3 years    Quit date: 04/06/2011  . Smokeless tobacco: Never Used     Comment: 05/01/2013 "smoked Black N Milds"  . Alcohol Use: 0.0 oz/week    0 Standard drinks or equivalent per week     Comment: 05/01/2013 "none recently" -occasional   OB History    Gravida Para Term Preterm AB TAB SAB Ectopic Multiple Living   1 1 1  0 0 0 0 0 0 1      Review of Systems  Constitutional: Negative for fever and chills.  HENT: Positive for  congestion and sore throat.   Respiratory: Positive for cough.       Allergies  Review of patient's allergies indicates no known allergies.  Home Medications   Prior to Admission medications   Medication Sig Start Date End Date Taking? Authorizing Provider  benzonatate (TESSALON) 100 MG capsule Take 1 capsule (100 mg total) by mouth every 8 (eight) hours. 11/15/15   Mady GemmaElizabeth C Jaymin Waln, PA-C  ibuprofen (ADVIL,MOTRIN) 800 MG tablet Take 1 tablet (800 mg total) by mouth 3 (three) times daily. 11/15/15   Mady GemmaElizabeth C Ariyan Sinnett, PA-C  levonorgestrel (MIRENA) 20 MCG/24HR IUD 1 each by Intrauterine route once.    Historical Provider, MD  magic mouthwash w/lidocaine SOLN Take 5 mLs by mouth 3 (three) times daily as needed for mouth pain. 11/15/15   Mady GemmaElizabeth C Rick Warnick, PA-C  metroNIDAZOLE (FLAGYL) 500 MG tablet Take 1 tablet (500 mg total) by mouth 2 (two) times daily. 10/09/15   Brock Badharles A Harper, MD    BP 133/85 mmHg  Pulse 81  Temp(Src) 98 F (36.7 C) (Oral)  Resp 18  SpO2 100%  LMP 10/28/2015 (Approximate) Physical Exam  Constitutional: She is oriented to person, place, and time. She appears well-developed and well-nourished. No distress.  HENT:  Head: Normocephalic and atraumatic.  Right Ear: External ear normal.  Left Ear: External ear normal.  Nose: Nose normal.  Mouth/Throat: Uvula is midline and mucous membranes are normal. Oropharyngeal exudate and posterior oropharyngeal erythema present. No posterior oropharyngeal edema or tonsillar abscesses.  Mild erythema and tonsillar hypertrophy bilaterally. Patient handling her secretions without difficulty.  Eyes: Conjunctivae, EOM and lids are normal. Pupils are equal, round, and reactive to light. Right eye exhibits no discharge. Left eye exhibits no discharge. No scleral icterus.  Neck: Normal range of motion. Neck supple.  Cardiovascular: Normal rate, regular rhythm, normal heart sounds, intact distal pulses and normal pulses.    Pulmonary/Chest: Effort normal and breath sounds normal. No respiratory distress. She has no wheezes. She has no rales.  Abdominal: Normal appearance. There is no rigidity.  Musculoskeletal: Normal range of motion. She exhibits no edema or tenderness.  Neurological: She is alert and oriented to person, place, and time.  Skin: Skin is warm, dry and intact. No rash noted. She is not diaphoretic. No erythema. No pallor.  Psychiatric: She has a normal mood and affect. Her speech is normal and behavior is normal.  Nursing note and vitals reviewed.   ED Course  Procedures (including critical care time)  Labs Review Labs Reviewed  RAPID STREP SCREEN (NOT AT [facility])  CULTURE, GROUP A STREP [facility])    Imaging Review Dg Chest 2 View  11/15/2015  CLINICAL DATA:  25 year old female with a history of cough for 2 days. EXAM: CHEST - 2 VIEW COMPARISON:  CT 05/01/2013, plain film 05/04/2013 FINDINGS: Cardiomediastinal silhouette projects within normal limits in size and contour. No confluent airspace disease, pneumothorax, or pleural effusion. No displaced fracture. Unremarkable appearance of the upper abdomen. IMPRESSION: No radiographic evidence of acute cardiopulmonary disease. Signed, Yvone Neu. Loreta Ave, DO [facility] Radiology Electronically Signed   By: Gilmer Mor D.O.   On: 11/15/2015 10:00   I have personally reviewed and evaluated these images and lab results as part of my medical decision-making.   EKG Interpretation None      MDM   Final diagnoses:  URI (upper respiratory infection)    25 year old female presents with nasal congestion, sore throat, productive cough, and generalized body aches since yesterday. Denies fever or chills. Patient is afebrile. Vital signs stable. On exam, she has mild erythema and tonsillar hypertrophy bilaterally. Patient handling her secretions well. Heart regular rate and rhythm. Lungs clear to auscultation  bilaterally. CXR negative for acute cardiopulmonary disease. Rapid strep pending. Will give ibuprofen, tessalon, and magic mouthwash for symptoms.  Rapid strep negative. Discussed finding with patient, who reports significant symptom improvement s/p medication administration. Patient is non-toxic and well-appearing, feel she is stable for discharge at this time. Symptoms likely viral. Will give ibuprofen, tessalon, and magic mouthwash for home. Patient to follow-up with PCP in 2-3 days. Return precautions discussed. Patient verbalizes her understanding and is in agreement with plan.  BP 133/85 mmHg  Pulse 81  Temp(Src) 98 F (36.7 C) (Oral)  Resp 18  SpO2 100%  LMP 10/28/2015 (Approximate)     Mady Gemma, PA-C 11/15/15 1137  Alvira Monday, MD 11/15/15 2055

## 2015-11-15 NOTE — Discharge Instructions (Signed)
1. Medications: tessalon, ibuprofen, magic mouthwash, usual home medications 2. Treatment: rest, drink plenty of fluids 3. Follow Up: please followup with your primary doctor for discussion of your diagnoses and further evaluation after today's visit; please return to the ER for high fever, difficulty swallowing, new or worsening symptoms   Upper Respiratory Infection, Adult Most upper respiratory infections (URIs) are a viral infection of the air passages leading to the lungs. A URI affects the nose, throat, and upper air passages. The most common type of URI is nasopharyngitis and is typically referred to as "the common cold." URIs run their course and usually go away on their own. Most of the time, a URI does not require medical attention, but sometimes a bacterial infection in the upper airways can follow a viral infection. This is called a secondary infection. Sinus and middle ear infections are common types of secondary upper respiratory infections. Bacterial pneumonia can also complicate a URI. A URI can worsen asthma and chronic obstructive pulmonary disease (COPD). Sometimes, these complications can require emergency medical care and may be life threatening.  CAUSES Almost all URIs are caused by viruses. A virus is a type of germ and can spread from one person to another.  RISKS FACTORS You may be at risk for a URI if:   You smoke.   You have chronic heart or lung disease.  You have a weakened defense (immune) system.   You are very young or very old.   You have nasal allergies or asthma.  You work in crowded or poorly ventilated areas.  You work in health care facilities or schools. SIGNS AND SYMPTOMS  Symptoms typically develop 2-3 days after you come in contact with a cold virus. Most viral URIs last 7-10 days. However, viral URIs from the influenza virus (flu virus) can last 14-18 days and are typically more severe. Symptoms may include:   Runny or stuffy (congested)  nose.   Sneezing.   Cough.   Sore throat.   Headache.   Fatigue.   Fever.   Loss of appetite.   Pain in your forehead, behind your eyes, and over your cheekbones (sinus pain).  Muscle aches.  DIAGNOSIS  Your health care provider may diagnose a URI by:  Physical exam.  Tests to check that your symptoms are not due to another condition such as:  Strep throat.  Sinusitis.  Pneumonia.  Asthma. TREATMENT  A URI goes away on its own with time. It cannot be cured with medicines, but medicines may be prescribed or recommended to relieve symptoms. Medicines may help:  Reduce your fever.  Reduce your cough.  Relieve nasal congestion. HOME CARE INSTRUCTIONS   Take medicines only as directed by your health care provider.   Gargle warm saltwater or take cough drops to comfort your throat as directed by your health care provider.  Use a warm mist humidifier or inhale steam from a shower to increase air moisture. This may make it easier to breathe.  Drink enough fluid to keep your urine clear or pale yellow.   Eat soups and other clear broths and maintain good nutrition.   Rest as needed.   Return to work when your temperature has returned to normal or as your health care provider advises. You may need to stay home longer to avoid infecting others. You can also use a face mask and careful hand washing to prevent spread of the virus.  Increase the usage of your inhaler if you have asthma.  Do not use any tobacco products, including cigarettes, chewing tobacco, or electronic cigarettes. If you need help quitting, ask your health care provider. PREVENTION  The best way to protect yourself from getting a cold is to practice good hygiene.   Avoid oral or hand contact with people with cold symptoms.   Wash your hands often if contact occurs.  There is no clear evidence that vitamin C, vitamin E, echinacea, or exercise reduces the chance of developing a  cold. However, it is always recommended to get plenty of rest, exercise, and practice good nutrition.  SEEK MEDICAL CARE IF:   You are getting worse rather than better.   Your symptoms are not controlled by medicine.   You have chills.  You have worsening shortness of breath.  You have brown or red mucus.  You have yellow or brown nasal discharge.  You have pain in your face, especially when you bend forward.  You have a fever.  You have swollen neck glands.  You have pain while swallowing.  You have white areas in the back of your throat. SEEK IMMEDIATE MEDICAL CARE IF:   You have severe or persistent:  Headache.  Ear pain.  Sinus pain.  Chest pain.  You have chronic lung disease and any of the following:  Wheezing.  Prolonged cough.  Coughing up blood.  A change in your usual mucus.  You have a stiff neck.  You have changes in your:  Vision.  Hearing.  Thinking.  Mood. MAKE SURE YOU:   Understand these instructions.  Will watch your condition.  Will get help right away if you are not doing well or get worse.   This information is not intended to replace advice given to you by your health care provider. Make sure you discuss any questions you have with your health care provider.   Document Released: 02/16/2001 Document Revised: 01/07/2015 Document Reviewed: 11/28/2013 Elsevier Interactive Patient Education [employer]

## 2015-11-15 NOTE — ED Notes (Signed)
Pt admits to sore throat, productive cough, body aches and chills that began yesterday - pt unsure of any fever as she did not check her temperature at home.

## 2015-11-18 LAB — CULTURE, GROUP A STREP (THRC)

## 2016-03-29 ENCOUNTER — Ambulatory Visit: Payer: Self-pay | Admitting: Obstetrics and Gynecology

## 2016-05-11 ENCOUNTER — Inpatient Hospital Stay (HOSPITAL_COMMUNITY)
Admission: AD | Admit: 2016-05-11 | Discharge: 2016-05-11 | Disposition: A | Discharge status: Home / Self Care | Payer: Medicaid Other | Source: Ambulatory Visit | Attending: Family Medicine | Admitting: Family Medicine

## 2016-05-11 ENCOUNTER — Encounter (HOSPITAL_COMMUNITY): Payer: Self-pay | Admitting: *Deleted

## 2016-05-11 DIAGNOSIS — R110 Nausea: Secondary | ICD-10-CM | POA: Insufficient documentation

## 2016-05-11 DIAGNOSIS — Z113 Encounter for screening for infections with a predominantly sexual mode of transmission: Secondary | ICD-10-CM | POA: Insufficient documentation

## 2016-05-11 DIAGNOSIS — T8384XA Pain from genitourinary prosthetic devices, implants and grafts, initial encounter: Secondary | ICD-10-CM | POA: Insufficient documentation

## 2016-05-11 DIAGNOSIS — Z87891 Personal history of nicotine dependence: Secondary | ICD-10-CM | POA: Insufficient documentation

## 2016-05-11 DIAGNOSIS — Z8249 Family history of ischemic heart disease and other diseases of the circulatory system: Secondary | ICD-10-CM | POA: Diagnosis not present

## 2016-05-11 DIAGNOSIS — N898 Other specified noninflammatory disorders of vagina: Secondary | ICD-10-CM | POA: Insufficient documentation

## 2016-05-11 DIAGNOSIS — R112 Nausea with vomiting, unspecified: Secondary | ICD-10-CM | POA: Diagnosis present

## 2016-05-11 DIAGNOSIS — Y848 Other medical procedures as the cause of abnormal reaction of the patient, or of later complication, without mention of misadventure at the time of the procedure: Secondary | ICD-10-CM | POA: Insufficient documentation

## 2016-05-11 LAB — URINALYSIS, ROUTINE W REFLEX MICROSCOPIC
BILIRUBIN URINE: NEGATIVE
Glucose, UA: NEGATIVE mg/dL
Ketones, ur: NEGATIVE mg/dL
Leukocytes, UA: NEGATIVE
NITRITE: NEGATIVE
PH: 5.5 (ref 5.0–8.0)
Protein, ur: NEGATIVE mg/dL

## 2016-05-11 LAB — WET PREP, GENITAL
CLUE CELLS WET PREP: NONE SEEN
Sperm: NONE SEEN
Trich, Wet Prep: NONE SEEN
Yeast Wet Prep HPF POC: NONE SEEN

## 2016-05-11 LAB — POCT PREGNANCY, URINE: PREG TEST UR: NEGATIVE

## 2016-05-11 LAB — URINE MICROSCOPIC-ADD ON

## 2016-05-11 MED ORDER — PROMETHAZINE HCL 12.5 MG PO TABS: 12.5000 mg | ORAL_TABLET | Freq: Four times a day (QID) | ORAL | 0 refills | Status: DC | PRN

## 2016-05-11 MED ORDER — ONDANSETRON 4 MG PO TBDP
4.0000 mg | ORAL_TABLET | Freq: Once | ORAL | Status: AC
  Administered 2016-05-11: 4 mg via ORAL
  Filled 2016-05-11: qty 1

## 2016-05-11 NOTE — MAU Note (Signed)
Pt states two weeks ago had intermittent episodes of N&V, has been more constant since Thursday - not holding down liquids.  Has an IUD, feels pinching sensation in vagina - with bending & walking.  Has had some spotting, hasn't had a period for two months.  Was having regular periods before even with the IUD.

## 2016-05-11 NOTE — MAU Provider Note (Signed)
History     CSN: [id number]  Arrival date and time: 05/11/16 1752   First Provider Initiated Contact with Patient 05/11/16 1847      Chief Complaint  Patient presents with  . Emesis  . Vaginal Pain   HPI  Pamela Klein is a 25 y.o. [id number] female who presents with nausea/vomiting & vaginal pain.  Mirena IUD placed by Dr. Clearance CootsHarper in 2013. Pt states for the last 2 weeks has had pinching inside her vagina; worse with walking and movement. Also reports pink spotting daily for the last 2 weeks. Denies dyspareunia or postcoital bleeding but has not had intercourse since the pinching started. Also notes thick yellow vaginal discharge.  Nausea & vomiting x 2 weeks. Worsened in the last 2 days. States has trouble keeping down food. Denies abdominal pain, diarrhea, constipation, heartburn, fever/chills.  [facility] told she couldn't be seen until the end of October & that she should come to MAU.   OB History    Gravida Para Term Preterm AB Living   1 1 1  0 0 1   SAB TAB Ectopic Multiple Live Births   0 0 0 0 1      Past Medical History:  Diagnosis Date  . Chest pain on exertion    "the last couple of days; I've been doing alot of coughing" (05/01/2013)  . Chlamydia   . Chronic back pain   . Daily headache    "last few days; since I've been sick" (05/01/2013)  . H/O candidiasis   . H/O varicella   . Hypertension   . Pneumonia    "now's the first time" (05/01/2013)  . Shortness of breath    "all the time right now" (05/01/2013)    Past Surgical History:  Procedure Laterality Date  . INTRAUTERINE DEVICE INSERTION  03/2012    Family History  Problem Relation Age of Onset  . Hypertension Father   . Diabetes Paternal Grandmother     Social History  Substance Use Topics  . Smoking status: Former Smoker    Years: 3.00    Quit date: 04/06/2011  . Smokeless tobacco: Never Used     Comment: 05/01/2013 "smoked Black N Milds"  . Alcohol use 0.0 oz/week     Comment:  05/01/2013 "none recently" -occasional    Allergies: No Known Allergies  Prescriptions Prior to Admission  Medication Sig Dispense Refill Last Dose  . benzonatate (TESSALON) 100 MG capsule Take 1 capsule (100 mg total) by mouth every 8 (eight) hours. 21 capsule 0   . ibuprofen (ADVIL,MOTRIN) 800 MG tablet Take 1 tablet (800 mg total) by mouth 3 (three) times daily. 21 tablet 0   . levonorgestrel (MIRENA) 20 MCG/24HR IUD 1 each by Intrauterine route once.   Taking  . magic mouthwash w/lidocaine SOLN Take 5 mLs by mouth 3 (three) times daily as needed for mouth pain. 45 mL 0   . metroNIDAZOLE (FLAGYL) 500 MG tablet Take 1 tablet (500 mg total) by mouth 2 (two) times daily. 14 tablet 4     Review of Systems  Constitutional: Negative.   Gastrointestinal: Positive for nausea and vomiting. Negative for abdominal pain, constipation, diarrhea and heartburn.  Genitourinary: Negative for dysuria and frequency.       + vaginal spotting + vaginal pain + vaginal discharge   Physical Exam   Blood pressure 130/94, pulse 71, temperature 98.3 F (36.8 C), temperature source Oral, resp. rate 17, height 5\' 4"  (1.626 m), weight 170  lb (77.1 kg), last menstrual period 02/20/2016.  Physical Exam  Nursing note and vitals reviewed. Constitutional: She is oriented to person, place, and time. She appears well-developed and well-nourished. No distress.  HENT:  Head: Normocephalic and atraumatic.  Eyes: Conjunctivae are normal. Right eye exhibits no discharge. Left eye exhibits no discharge. No scleral icterus.  Neck: Normal range of motion.  Cardiovascular: Normal rate.   Respiratory: Effort normal. No respiratory distress.  GI: Soft. She exhibits no distension. There is no tenderness.  Genitourinary: Cervix exhibits friability. Cervix exhibits no motion tenderness. Vaginal discharge (small amount of thin yellow discharge) found.  Genitourinary Comments: IUD removed without complication  Neurological: She  is alert and oriented to person, place, and time.  Skin: Skin is warm and dry. She is not diaphoretic.  Psychiatric: She has a normal mood and affect. Her behavior is normal. Judgment and thought content normal.    MAU Course  Procedures Results for orders placed or performed during the hospital encounter of 05/11/16 (from the past 24 hour(s))  Urinalysis, Routine w reflex microscopic (not at [facility])     Status: Abnormal   Collection Time: 05/11/16  6:10 PM  Result Value Ref Range   Color, Urine YELLOW YELLOW   APPearance CLEAR CLEAR   Specific Gravity, Urine >1.030 (H) 1.005 - 1.030   pH 5.5 5.0 - 8.0   Glucose, UA NEGATIVE NEGATIVE mg/dL   Hgb urine dipstick SMALL (A) NEGATIVE   Bilirubin Urine NEGATIVE NEGATIVE   Ketones, ur NEGATIVE NEGATIVE mg/dL   Protein, ur NEGATIVE NEGATIVE mg/dL   Nitrite NEGATIVE NEGATIVE   Leukocytes, UA NEGATIVE NEGATIVE  Urine microscopic-add on     Status: Abnormal   Collection Time: 05/11/16  6:10 PM  Result Value Ref Range   Squamous Epithelial / LPF 6-30 (A) NONE SEEN   WBC, UA 6-30 0 - 5 WBC/hpf   RBC / HPF 0-5 0 - 5 RBC/hpf   Bacteria, UA MANY (A) NONE SEEN   Urine-Other MUCOUS PRESENT   Pregnancy, urine POC     Status: None   Collection Time: 05/11/16  6:24 PM  Result Value Ref Range   Preg Test, Ur NEGATIVE NEGATIVE  Wet prep, genital     Status: Abnormal   Collection Time: 05/11/16  7:05 PM  Result Value Ref Range   Yeast Wet Prep HPF POC NONE SEEN NONE SEEN   Trich, Wet Prep NONE SEEN NONE SEEN   Clue Cells Wet Prep HPF POC NONE SEEN NONE SEEN   WBC, Wet Prep HPF POC MODERATE (A) NONE SEEN   Sperm NONE SEEN     MDM UPT negative GC/CT & wet prep Zofran 4 mg ODT IUD removed; discussed return of fertility; pt should use condoms during sexual encounters until she follows up with [facility] for contraception management  Assessment and Plan  A:  1. Pain due to intrauterine contraceptive device (IUD), initial encounter (HCC)   2.  Nausea without vomiting   3. Screen for STD (sexually transmitted disease)     P; Discharge home Rx phenergan & zofran GC/CT pending F/u with [facility] for contraception management   Judeth Horn 05/11/2016, 6:47 PM

## 2016-05-11 NOTE — Discharge Instructions (Signed)
Nausea and Vomiting Nausea means you feel sick to your stomach. Throwing up (vomiting) is a reflex where stomach contents come out of your mouth. HOME CARE   Take medicine as told by your doctor.  Do not force yourself to eat. However, you do need to drink fluids.  If you feel like eating, eat a normal diet as told by your doctor.  Eat rice, wheat, potatoes, bread, lean meats, yogurt, fruits, and vegetables.  Avoid high-fat foods.  Drink enough fluids to keep your pee (urine) clear or pale yellow.  Ask your doctor how to replace body fluid losses (rehydrate). Signs of body fluid loss (dehydration) include:  Feeling very thirsty.  Dry lips and mouth.  Feeling dizzy.  Dark pee.  Peeing less than normal.  Feeling confused.  Fast breathing or heart rate. GET HELP RIGHT AWAY IF:   You have blood in your throw up.  You have black or bloody poop (stool).  You have a bad headache or stiff neck.  You feel confused.  You have bad belly (abdominal) pain.  You have chest pain or trouble breathing.  You do not pee at least once every 8 hours.  You have cold, clammy skin.  You keep throwing up after 24 to 48 hours.  You have a fever. MAKE SURE YOU:   Understand these instructions.  Will watch your condition.  Will get help right away if you are not doing well or get worse.   This information is not intended to replace advice given to you by your health care provider. Make sure you discuss any questions you have with your health care provider.   Document Released: 02/09/2008 Document Revised: 11/15/2011 Document Reviewed: 01/22/2011 Elsevier Interactive Patient Education [employer] Contraception Choices Birth control (contraception) is the use of any methods or devices to stop pregnancy from happening. Below are some methods to help avoid pregnancy. HORMONAL BIRTH CONTROL  A small tube put under the skin of the upper arm (implant). The tube can stay in  place for 3 years. The implant must be taken out after 3 years.  Shots given every 3 months.  Pills taken every day.  Patches that are changed once a week.  A ring put into the vagina (vaginal ring). The ring is left in place for 3 weeks and removed for 1 week. Then, a new ring is put in the vagina.  Emergency birth control pills taken after unprotected sex (intercourse). BARRIER BIRTH CONTROL   A thin covering worn on the penis (female condom) during sex.  A soft, loose covering put into the vagina (female condom) before sex.  A rubber bowl that sits over the cervix (diaphragm). The bowl must be made for you. The bowl is put into the vagina before sex. The bowl is left in place for 6 to 8 hours after sex.  A small, soft cup that fits over the cervix (cervical cap). The cup must be made for you. The cup can be left in place for 48 hours after sex.  A sponge that is put into the vagina before sex.  A chemical that kills or stops sperm from getting into the cervix and uterus (spermicide). The chemical may be a cream, jelly, foam, or pill. INTRAUTERINE (IUD) BIRTH CONTROL   IUD birth control is a small, T-shaped piece of plastic. The plastic is put inside the uterus. There are 2 types of IUD:  Copper IUD. The IUD is covered in copper wire. The copper makes  a fluid that kills sperm. It can stay in place for 10 years.  Hormone IUD. The hormone stops pregnancy from happening. It can stay in place for 5 years. PERMANENT METHODS  When the woman has her fallopian tubes sealed, tied, or blocked during surgery. This stops the egg from traveling to the uterus.  The doctor places a small coil or insert into each fallopian tube. This causes scar tissue to form and blocks the fallopian tubes.  When the female has the tubes that carry sperm tied off (vasectomy). NATURAL FAMILY PLANNING BIRTH CONTROL   Natural family planning means not having sex or using barrier birth control on the days the  woman could become pregnant.  Use a calendar to keep track of the length of each period and know the days she can get pregnant.  Avoid sex during ovulation.  Use a thermometer to measure body temperature. Also watch for symptoms of ovulation.  Time sex to be after the woman has ovulated. Use condoms to help protect yourself against sexually transmitted infections (STIs). Do this no matter what type of birth control you use. Talk to your doctor about which type of birth control is best for you.   This information is not intended to replace advice given to you by your health care provider. Make sure you discuss any questions you have with your health care provider.   Document Released: 06/20/2009 Document Revised: 08/28/2013 Document Reviewed: 03/14/2013 Elsevier Interactive Patient Education [employer] Inc[employer]

## 2016-05-12 LAB — GC/CHLAMYDIA PROBE AMP (~~LOC~~) NOT AT ARMC
Chlamydia: NEGATIVE
Neisseria Gonorrhea: NEGATIVE

## 2016-06-09 IMAGING — CR DG CHEST 2V
2 series · 2 of 2 positions shown · non-contrast
Comparison: CT 05/01/2013, plain film 05/04/2013

CLINICAL DATA: 24-year-old female with a history of cough for 2
days.

EXAM:
CHEST - 2 VIEW

[w chest pa]
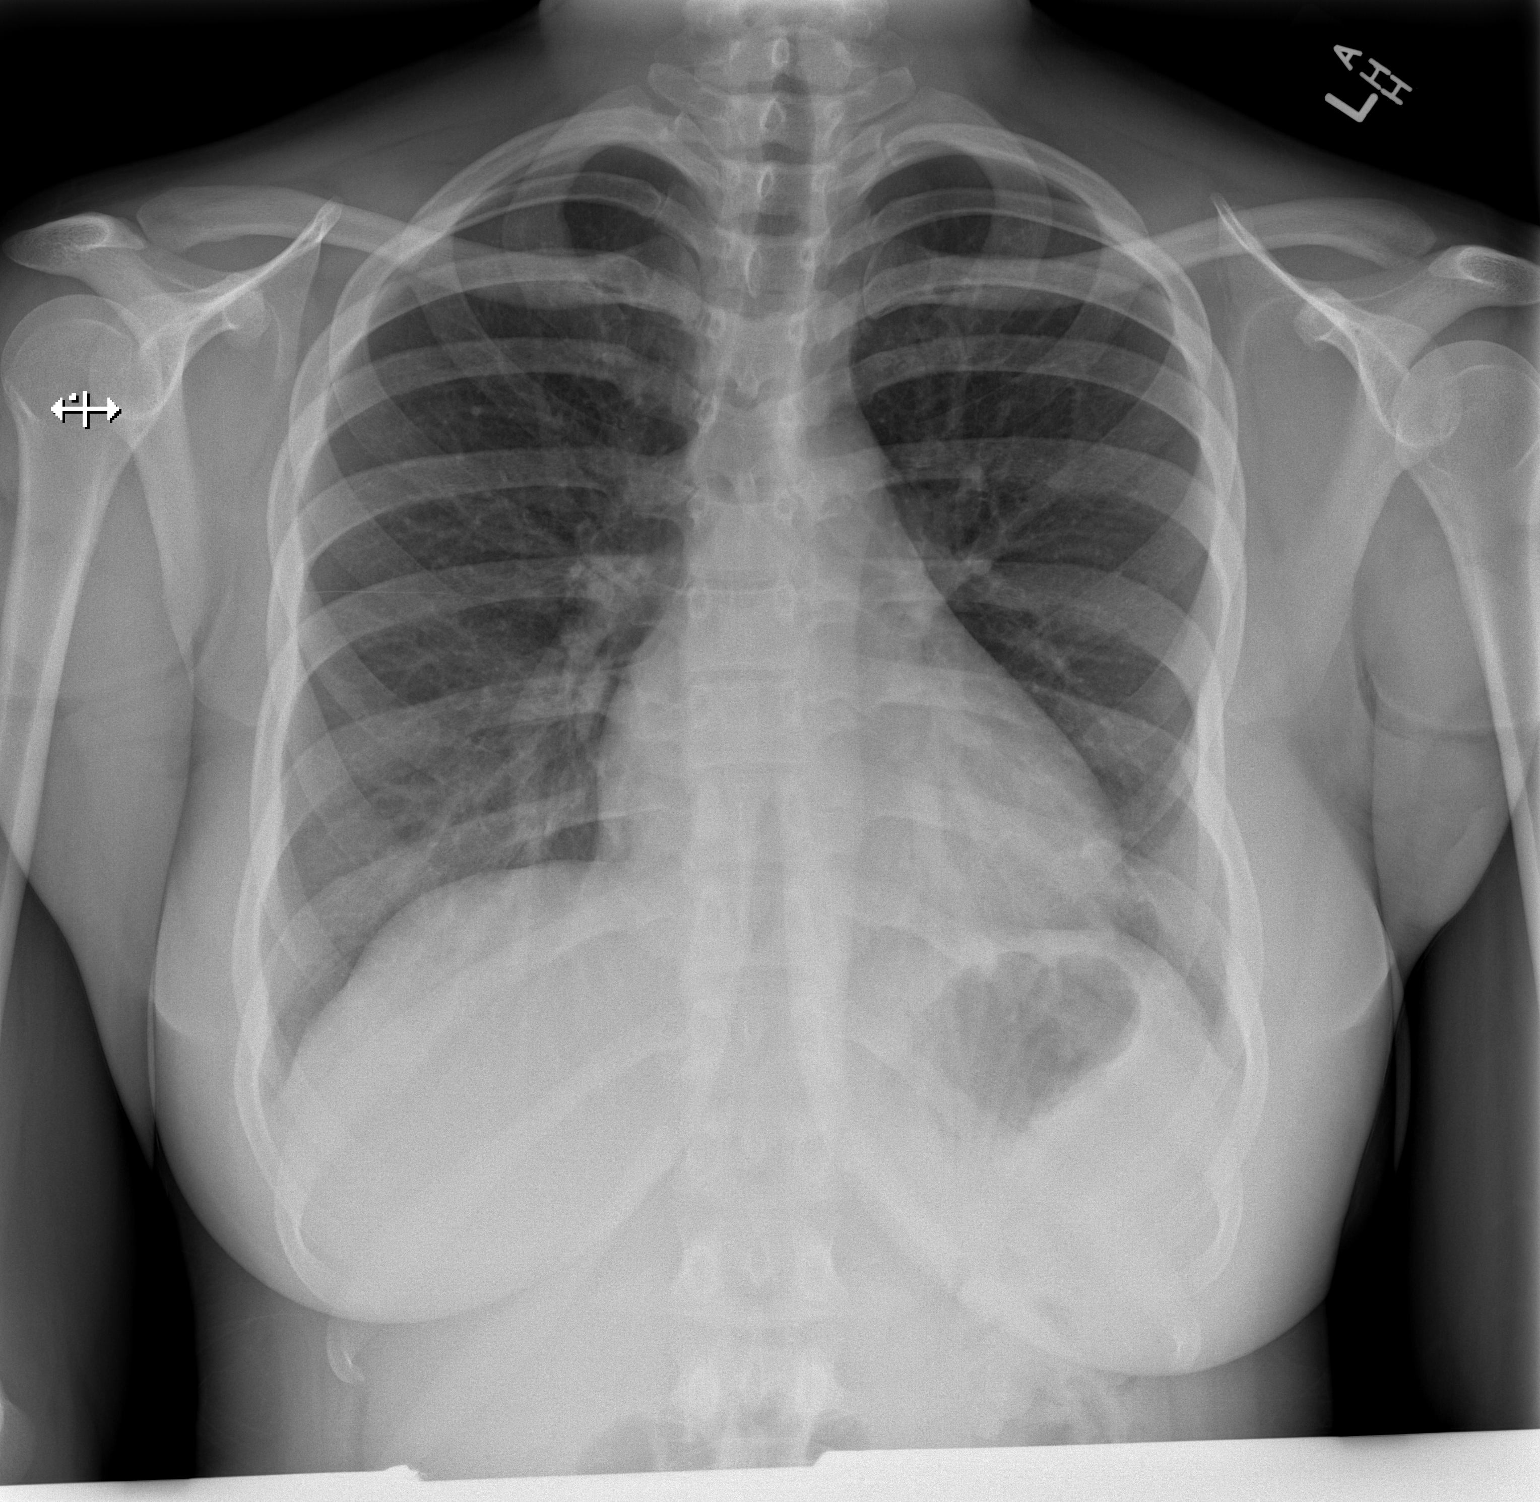

[w chest lat]
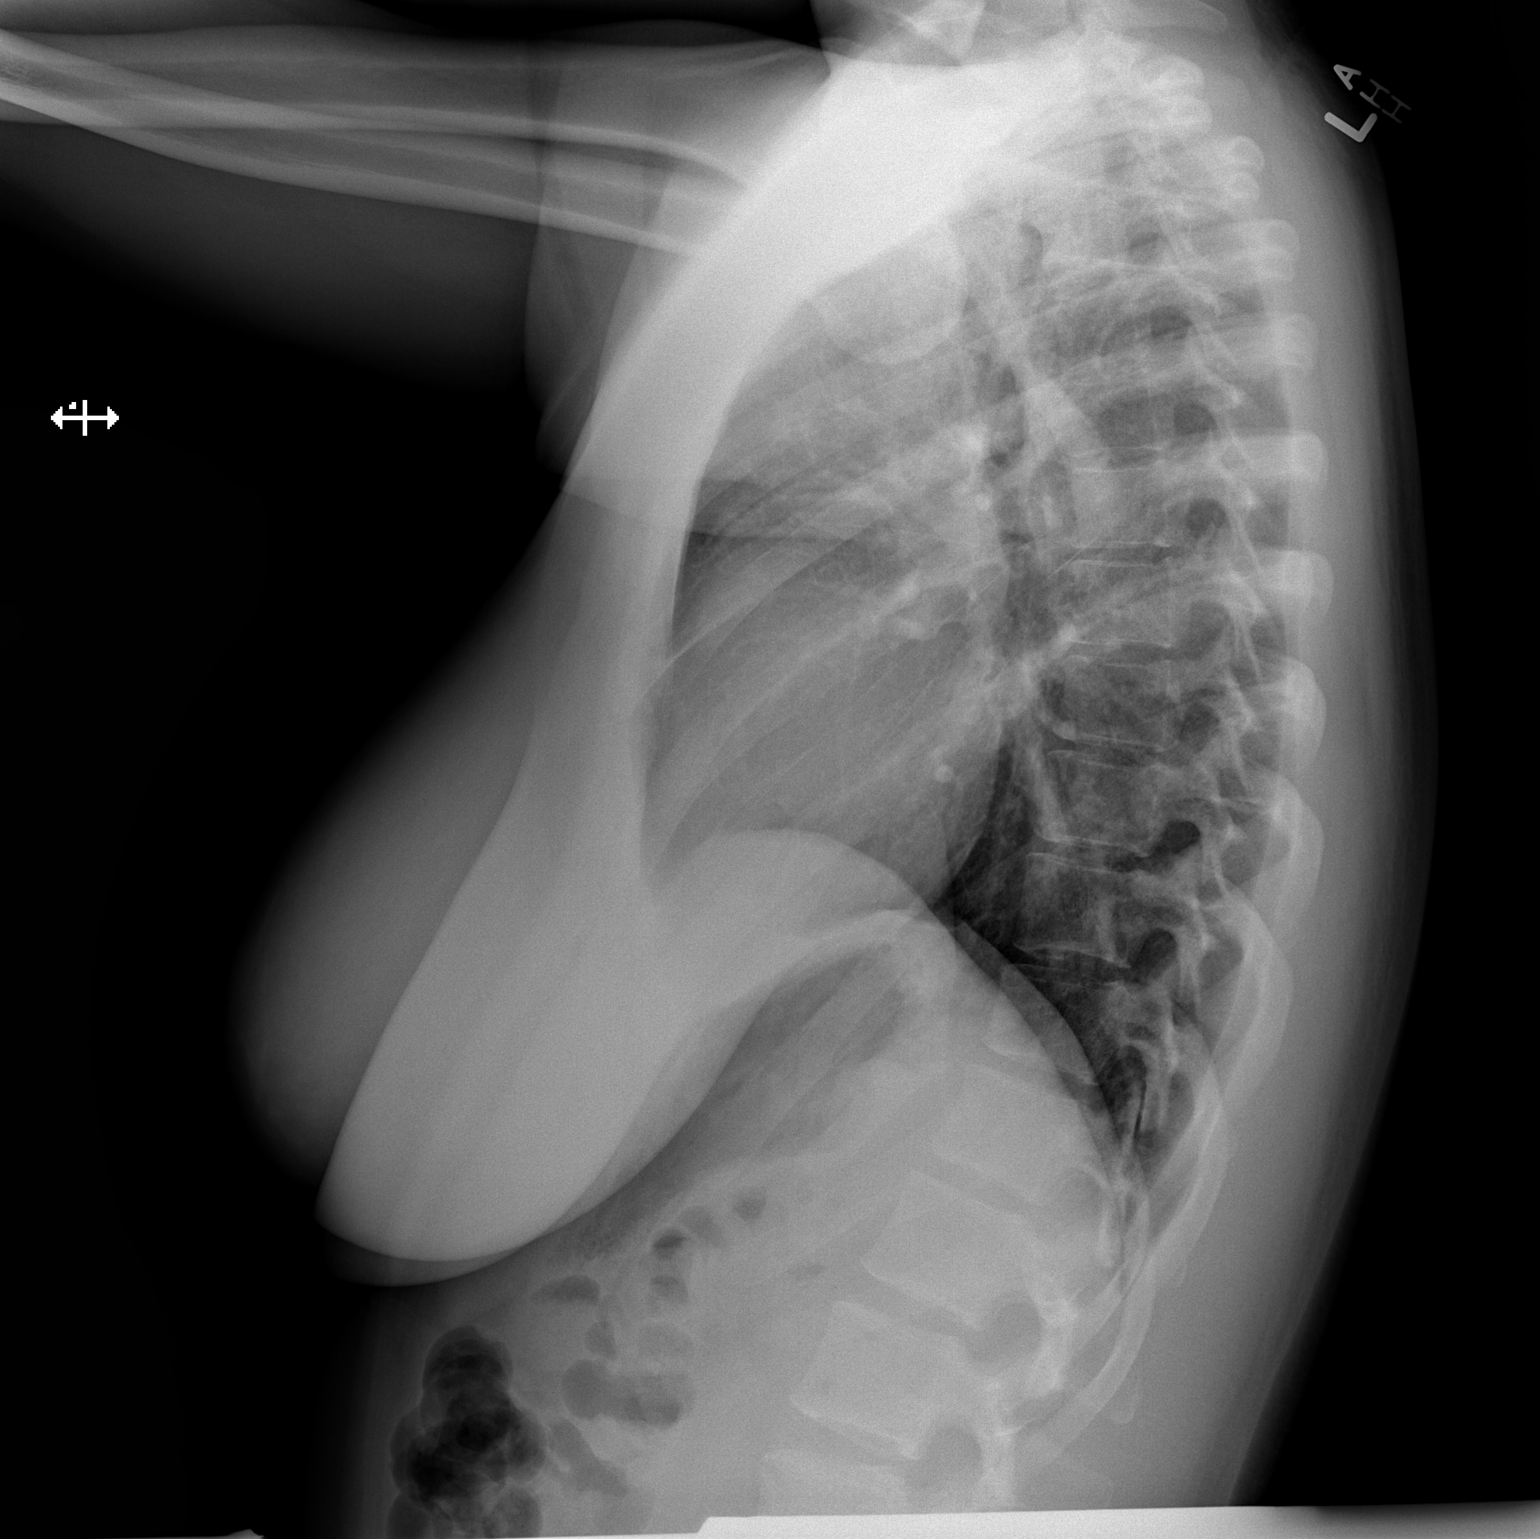

[2 of 2 positions shown; findings below may reference images not displayed]

FINDINGS: Cardiomediastinal silhouette projects within normal limits in size
and contour. No confluent airspace disease, pneumothorax, or pleural
effusion.

No displaced fracture.

Unremarkable appearance of the upper abdomen.
IMPRESSION: No radiographic evidence of acute cardiopulmonary disease.

## 2016-10-19 ENCOUNTER — Other Ambulatory Visit (HOSPITAL_COMMUNITY)
Admission: RE | Admit: 2016-10-19 | Discharge: 2016-10-19 | Disposition: A | Discharge status: Home / Self Care | Payer: Medicaid Other | Source: Ambulatory Visit | Attending: Obstetrics | Admitting: Obstetrics

## 2016-10-19 ENCOUNTER — Encounter: Payer: Self-pay | Admitting: Obstetrics

## 2016-10-19 ENCOUNTER — Ambulatory Visit: Payer: Medicaid Other | Admitting: Obstetrics

## 2016-10-19 ENCOUNTER — Ambulatory Visit (INDEPENDENT_AMBULATORY_CARE_PROVIDER_SITE_OTHER): Payer: Medicaid Other | Admitting: Obstetrics

## 2016-10-19 VITALS — BP 121/81 | HR 65 | Wt 187.6 lb

## 2016-10-19 DIAGNOSIS — Z124 Encounter for screening for malignant neoplasm of cervix: Secondary | ICD-10-CM

## 2016-10-19 DIAGNOSIS — Z30011 Encounter for initial prescription of contraceptive pills: Secondary | ICD-10-CM

## 2016-10-19 DIAGNOSIS — Z113 Encounter for screening for infections with a predominantly sexual mode of transmission: Secondary | ICD-10-CM

## 2016-10-19 DIAGNOSIS — Z01419 Encounter for gynecological examination (general) (routine) without abnormal findings: Secondary | ICD-10-CM | POA: Diagnosis not present

## 2016-10-19 DIAGNOSIS — Z0000 Encounter for general adult medical examination without abnormal findings: Secondary | ICD-10-CM | POA: Diagnosis not present

## 2016-10-19 DIAGNOSIS — N898 Other specified noninflammatory disorders of vagina: Secondary | ICD-10-CM

## 2016-10-19 DIAGNOSIS — Z3009 Encounter for other general counseling and advice on contraception: Secondary | ICD-10-CM

## 2016-10-19 DIAGNOSIS — N946 Dysmenorrhea, unspecified: Secondary | ICD-10-CM

## 2016-10-19 MED ORDER — IBUPROFEN 800 MG PO TABS: 800.0000 mg | ORAL_TABLET | Freq: Three times a day (TID) | ORAL | 5 refills | Status: DC | PRN

## 2016-10-19 MED ORDER — NORETHINDRONE-ETH ESTRADIOL 1-35 MG-MCG PO TABS: 1.0000 | ORAL_TABLET | Freq: Every day | ORAL | 11 refills | Status: DC

## 2016-10-19 NOTE — Progress Notes (Signed)
Patient is in the office for annual exam. Patient is having discharge for a few months. She is wearing liners daily. Patient is having intermittent sharp pain in lower abdomen- twice weekly. She states the pain is significant.

## 2016-10-20 ENCOUNTER — Encounter: Payer: Self-pay | Admitting: Obstetrics

## 2016-10-20 ENCOUNTER — Ambulatory Visit: Payer: Medicaid Other | Admitting: Obstetrics

## 2016-10-20 LAB — CERVICOVAGINAL ANCILLARY ONLY
BACTERIAL VAGINITIS: NEGATIVE
CANDIDA VAGINITIS: NEGATIVE
CHLAMYDIA, DNA PROBE: NEGATIVE
Neisseria Gonorrhea: NEGATIVE
TRICH (WINDOWPATH): NEGATIVE

## 2016-10-20 LAB — CYTOLOGY - PAP: Diagnosis: NEGATIVE

## 2016-10-20 NOTE — Progress Notes (Signed)
Subjective:        Pamela Klein is a 26 y.o. female here for a routine exam.  Current complaints: Vaginal discharge without odor or irritation.  Cramping increased with periods.    Personal health questionnaire:  Is patient Ashkenazi Jewish, have a family history of breast and/or ovarian cancer: no Is there a family history of uterine cancer diagnosed at age < 50, gastrointestinal cancer, urinary tract cancer, family member who is a Personnel officer syndrome-associated carrier: no Is the patient overweight and hypertensive, family history of diabetes, personal history of gestational diabetes, preeclampsia or PCOS: no Is patient over 28, have PCOS,  family history of premature CHD under age 30, diabetes, smoke, have hypertension or peripheral artery disease:  no At any time, has a partner hit, kicked or otherwise hurt or frightened you?: no Over the past 2 weeks, have you felt down, depressed or hopeless?: no Over the past 2 weeks, have you felt little interest or pleasure in doing things?:no   Gynecologic History Patient's last menstrual period was 10/01/2015. Contraception: condoms Last Pap: 2016. Results were: normal Last mammogram: n/a. Results were: n/a  Obstetric History OB History  Gravida Para Term Preterm AB Living  1 1 1  0 0 1  SAB TAB Ectopic Multiple Live Births  0 0 0 0 1    # Outcome Date GA Lbr Len/2nd Weight Sex Delivery Anes PTL Lv  1 Term 02/27/12 [redacted]w[redacted]d   F Vag-Spont Local  LIV      Past Medical History:  Diagnosis Date  . Chest pain on exertion    "the last couple of days; I've been doing alot of coughing" (05/01/2013)  . Chlamydia   . Chronic back pain   . Daily headache    "last few days; since I've been sick" (05/01/2013)  . H/O candidiasis   . H/O varicella   . Hypertension   . Pneumonia    "now's the first time" (05/01/2013)  . Shortness of breath    "all the time right now" (05/01/2013)    Past Surgical History:  Procedure Laterality Date  .  INTRAUTERINE DEVICE INSERTION  03/2012     Current Outpatient Prescriptions:  .  ibuprofen (ADVIL,MOTRIN) 800 MG tablet, Take 1 tablet (800 mg total) by mouth every 8 (eight) hours as needed for cramping., Disp: 30 tablet, Rfl: 5 .  norethindrone-ethinyl estradiol 1/35 (ORTHO-NOVUM 1/35, 28,) tablet, Take 1 tablet by mouth daily., Disp: 1 Package, Rfl: 11 No Known Allergies  Social History  Substance Use Topics  . Smoking status: Former Smoker    Years: 3.00    Quit date: 04/06/2011  . Smokeless tobacco: Never Used     Comment: 05/01/2013 "smoked Black N Milds"  . Alcohol use 0.0 oz/week     Comment: 05/01/2013 "none recently" -occasional    Family History  Problem Relation Age of Onset  . Hypertension Father   . Diabetes Paternal Grandmother       Review of Systems  Constitutional: negative for fatigue and weight loss Respiratory: negative for cough and wheezing Cardiovascular: negative for chest pain, fatigue and palpitations Gastrointestinal: negative for abdominal pain and change in bowel habits Musculoskeletal:negative for myalgias Neurological: negative for gait problems and tremors Behavioral/Psych: negative for abusive relationship, depression Endocrine: negative for temperature intolerance    Genitourinary:positive for vaginal discharge and painful periods Integument/breast: negative for breast lump, breast tenderness, nipple discharge and skin lesion(s)    Objective:       BP 121/81  Pulse 65   Wt 187 lb 9.6 oz (85.1 kg)   LMP 10/01/2015   BMI 32.20 kg/m  General:   alert  Skin:   no rash or abnormalities  Lungs:   clear to auscultation bilaterally  Heart:   regular rate and rhythm, S1, S2 normal, no murmur, click, rub or gallop  Breasts:   normal without suspicious masses, skin or nipple changes or axillary nodes  Abdomen:  normal findings: no organomegaly, soft, non-tender and no hernia  Pelvis:  External genitalia: normal general appearance Urinary  system: urethral meatus normal and bladder without fullness, nontender Vaginal: normal without tenderness, induration or masses Cervix: normal appearance Adnexa: normal bimanual exam Uterus: anteverted and non-tender, normal size   Lab Review Urine pregnancy test Labs reviewed yes Radiologic studies reviewed no  50% of 20 min visit spent on counseling and coordination of care.    Assessment:    Healthy female exam.    Vaginal discharge, probably physiologic  Dysmenorrhea   Plan:    Wet prep and cultures ordered  Ibuprofen Rx  Ortho Nonum 1/35 Rx   Education reviewed: calcium supplements, low fat, low cholesterol diet, safe sex/STD prevention, self breast exams and weight bearing exercise. Contraception: OCP (estrogen/progesterone). Follow up in: 1 year.   Meds ordered this encounter  Medications  . norethindrone-ethinyl estradiol 1/35 (ORTHO-NOVUM 1/35, 28,) tablet    Sig: Take 1 tablet by mouth daily.    Dispense:  1 Package    Refill:  11  . ibuprofen (ADVIL,MOTRIN) 800 MG tablet    Sig: Take 1 tablet (800 mg total) by mouth every 8 (eight) hours as needed for cramping.    Dispense:  30 tablet    Refill:  5   No orders of the defined types were placed in this encounter.    Patient ID: Pamela Klein, female   DOB: December 01, 1990, 26 y.o.   MRN: [id number]

## 2017-02-16 ENCOUNTER — Ambulatory Visit: Payer: Medicaid Other | Admitting: Obstetrics

## 2017-04-18 ENCOUNTER — Other Ambulatory Visit: Payer: Self-pay | Admitting: Obstetrics

## 2017-04-18 DIAGNOSIS — B3731 Acute candidiasis of vulva and vagina: Secondary | ICD-10-CM

## 2017-04-18 DIAGNOSIS — B9689 Other specified bacterial agents as the cause of diseases classified elsewhere: Secondary | ICD-10-CM

## 2017-04-18 DIAGNOSIS — B373 Candidiasis of vulva and vagina: Secondary | ICD-10-CM

## 2017-04-18 DIAGNOSIS — N760 Acute vaginitis: Principal | ICD-10-CM

## 2017-04-18 MED ORDER — METRONIDAZOLE 500 MG PO TABS: 500.0000 mg | ORAL_TABLET | Freq: Two times a day (BID) | ORAL | 2 refills | Status: DC

## 2017-04-18 MED ORDER — FLUCONAZOLE 150 MG PO TABS: 150.0000 mg | ORAL_TABLET | Freq: Once | ORAL | 2 refills | Status: AC

## 2017-11-28 ENCOUNTER — Encounter: Payer: Self-pay | Admitting: Advanced Practice Midwife

## 2017-11-28 ENCOUNTER — Ambulatory Visit (INDEPENDENT_AMBULATORY_CARE_PROVIDER_SITE_OTHER): Payer: 59 | Admitting: Advanced Practice Midwife

## 2017-11-28 ENCOUNTER — Other Ambulatory Visit: Payer: Self-pay

## 2017-11-28 VITALS — BP 124/82 | HR 70 | Ht 64.0 in | Wt 168.4 lb

## 2017-11-28 DIAGNOSIS — N898 Other specified noninflammatory disorders of vagina: Secondary | ICD-10-CM | POA: Diagnosis not present

## 2017-11-28 DIAGNOSIS — N946 Dysmenorrhea, unspecified: Secondary | ICD-10-CM | POA: Diagnosis not present

## 2017-11-28 DIAGNOSIS — N760 Acute vaginitis: Secondary | ICD-10-CM | POA: Insufficient documentation

## 2017-11-28 DIAGNOSIS — B9689 Other specified bacterial agents as the cause of diseases classified elsewhere: Secondary | ICD-10-CM | POA: Insufficient documentation

## 2017-11-28 DIAGNOSIS — T8332XA Displacement of intrauterine contraceptive device, initial encounter: Secondary | ICD-10-CM | POA: Insufficient documentation

## 2017-11-28 DIAGNOSIS — R109 Unspecified abdominal pain: Secondary | ICD-10-CM | POA: Insufficient documentation

## 2017-11-28 DIAGNOSIS — Z113 Encounter for screening for infections with a predominantly sexual mode of transmission: Secondary | ICD-10-CM | POA: Diagnosis not present

## 2017-11-28 DIAGNOSIS — E559 Vitamin D deficiency, unspecified: Secondary | ICD-10-CM

## 2017-11-28 HISTORY — DX: Vitamin D deficiency, unspecified: E55.9

## 2017-11-28 MED ORDER — IBUPROFEN 600 MG PO TABS: 600.0000 mg | ORAL_TABLET | Freq: Four times a day (QID) | ORAL | 0 refills | Status: DC | PRN

## 2017-11-28 NOTE — Progress Notes (Signed)
Presents for yellowish, malodorous vaginal discharge. C/o of itching, NV, lower abdominal pain 5/10 x 3 months.  Denies burning, chills, fever.  Needs Rx for Ibuprofen 800 mg. She was unable to pick up Rx in Feb. Due to Insurance issues.

## 2017-11-28 NOTE — Patient Instructions (Signed)

## 2017-11-28 NOTE — Progress Notes (Signed)
Subjective:     Patient ID: Pamela Klein, female   DOB: 1991-08-26, 27 y.o.   MRN: [id number]  Vaginal Discharge  The patient's primary symptoms include a genital odor, pelvic pain (only with menses) and vaginal discharge. The patient's pertinent negatives include no genital itching, genital lesions, missed menses or vaginal bleeding. This is a new problem. The current episode started in the past 7 days. The problem occurs intermittently. The problem has been unchanged. The patient is experiencing no pain. She is not pregnant. Pertinent negatives include no back pain, constipation, diarrhea, dysuria, fever, headaches, nausea or vomiting. The vaginal discharge was mucoid and copious. There has been no bleeding. She has not been passing clots. She has not been passing tissue. Nothing aggravates the symptoms. She has tried nothing for the symptoms. She is not sexually active. She uses nothing for contraception.     Review of Systems  Constitutional: Negative for fever.  Gastrointestinal: Negative for constipation, diarrhea, nausea and vomiting.  Genitourinary: Positive for pelvic pain (only with menses) and vaginal discharge. Negative for dysuria and missed menses.  Musculoskeletal: Negative for back pain.  Neurological: Negative for headaches.       Objective:   Physical Exam  Constitutional: She is oriented to person, place, and time. She appears well-developed and well-nourished. No distress.  HENT:  Head: Normocephalic.  Cardiovascular: Normal rate and regular rhythm.  Pulmonary/Chest: Effort normal. No respiratory distress.  Abdominal: Soft. She exhibits no distension. There is no tenderness. There is no rebound and no guarding.  Genitourinary:  Genitourinary Comments: Copious mucous discharge No odor No color Wet prep sent Cervix closed, no CMT Uterus nontender  Musculoskeletal: Normal range of motion.  Neurological: She is alert and oriented to person, place, and time.   Skin: Skin is warm and dry. She is not diaphoretic.  Psychiatric: She has a normal mood and affect.       Assessment:     Vaginal discharge, likely physiologic Intermittent pelvic pain with menses, likely dysmenorrhea States is celibate now, not worried about STDs    Plan:     Wet prep sent Discussed charting cycles to see if mucous coincides with cycle Offered contraception, declines

## 2017-11-29 LAB — CERVICOVAGINAL ANCILLARY ONLY
Bacterial vaginitis: POSITIVE — AB
Candida vaginitis: NEGATIVE
Chlamydia: NEGATIVE
NEISSERIA GONORRHEA: NEGATIVE
Trichomonas: NEGATIVE

## 2017-12-01 ENCOUNTER — Telehealth: Payer: Self-pay

## 2017-12-01 ENCOUNTER — Other Ambulatory Visit: Payer: Self-pay | Admitting: Advanced Practice Midwife

## 2017-12-01 DIAGNOSIS — N760 Acute vaginitis: Principal | ICD-10-CM

## 2017-12-01 DIAGNOSIS — B9689 Other specified bacterial agents as the cause of diseases classified elsewhere: Secondary | ICD-10-CM

## 2017-12-01 MED ORDER — METRONIDAZOLE 500 MG PO TABS: 500.0000 mg | ORAL_TABLET | Freq: Two times a day (BID) | ORAL | 2 refills | Status: DC

## 2017-12-01 NOTE — Progress Notes (Signed)
BV + Refilled Flagyl

## 2017-12-01 NOTE — Telephone Encounter (Signed)
Attempted to contact about results and rx, left vm to call. 

## 2017-12-02 ENCOUNTER — Telehealth: Payer: Self-pay

## 2017-12-02 NOTE — Telephone Encounter (Signed)
Returned call, left vm.

## 2017-12-30 ENCOUNTER — Ambulatory Visit (HOSPITAL_COMMUNITY)
Admission: EM | Admit: 2017-12-30 | Discharge: 2017-12-30 | Disposition: A | Discharge status: Home / Self Care | Payer: 59 | Attending: Family Medicine | Admitting: Family Medicine

## 2017-12-30 ENCOUNTER — Encounter (HOSPITAL_COMMUNITY): Payer: Self-pay | Admitting: *Deleted

## 2017-12-30 DIAGNOSIS — S60052A Contusion of left little finger without damage to nail, initial encounter: Secondary | ICD-10-CM | POA: Diagnosis not present

## 2017-12-30 DIAGNOSIS — M62830 Muscle spasm of back: Secondary | ICD-10-CM

## 2017-12-30 MED ORDER — NAPROXEN 375 MG PO TABS: 375.0000 mg | ORAL_TABLET | Freq: Two times a day (BID) | ORAL | 0 refills | Status: DC

## 2017-12-30 MED ORDER — CYCLOBENZAPRINE HCL 10 MG PO TABS: 10.0000 mg | ORAL_TABLET | Freq: Every evening | ORAL | 0 refills | Status: DC | PRN
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
## 2017-12-30 NOTE — Discharge Instructions (Addendum)
Rest, ice, heat and gentle stretches as needed Prescribed naproxen as needed for inflammation and pain relief Prescribed flexeril as needed at bedtime for muscle spasm.  Do not drive or operate heavy machinery while taking this medication Expect some increased pain in the next 1-3 days.  It may take 3-4 weeks for complete resolution of symptoms Return here or follow up with PCP if your symptoms persists Return or go to ER if you have any new or worsening symptoms
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
## 2019-03-16 ENCOUNTER — Ambulatory Visit: Payer: 59 | Admitting: Obstetrics

## 2019-03-23 ENCOUNTER — Ambulatory Visit: Payer: 59 | Admitting: Obstetrics

## 2019-03-30 ENCOUNTER — Other Ambulatory Visit: Payer: Self-pay

## 2019-03-30 ENCOUNTER — Ambulatory Visit: Payer: Managed Care, Other (non HMO) | Admitting: Obstetrics

## 2020-03-26 ENCOUNTER — Other Ambulatory Visit: Payer: Self-pay

## 2020-03-26 ENCOUNTER — Emergency Department (HOSPITAL_COMMUNITY)
Admission: EM | Admit: 2020-03-26 | Discharge: 2020-03-26 | Disposition: A | Discharge status: Home / Self Care | Payer: 59 | Attending: Emergency Medicine | Admitting: Emergency Medicine

## 2020-03-26 DIAGNOSIS — Z87891 Personal history of nicotine dependence: Secondary | ICD-10-CM | POA: Diagnosis not present

## 2020-03-26 DIAGNOSIS — I10 Essential (primary) hypertension: Secondary | ICD-10-CM | POA: Insufficient documentation

## 2020-03-26 DIAGNOSIS — R300 Dysuria: Secondary | ICD-10-CM | POA: Insufficient documentation

## 2020-03-26 DIAGNOSIS — R1030 Lower abdominal pain, unspecified: Secondary | ICD-10-CM | POA: Diagnosis not present

## 2020-03-26 DIAGNOSIS — N898 Other specified noninflammatory disorders of vagina: Secondary | ICD-10-CM | POA: Insufficient documentation

## 2020-03-26 LAB — HIV ANTIBODY (ROUTINE TESTING W REFLEX): HIV Screen 4th Generation wRfx: NONREACTIVE

## 2020-03-26 LAB — WET PREP, GENITAL
Clue Cells Wet Prep HPF POC: NONE SEEN
Sperm: NONE SEEN
Trich, Wet Prep: NONE SEEN
Yeast Wet Prep HPF POC: NONE SEEN

## 2020-03-26 LAB — URINALYSIS, ROUTINE W REFLEX MICROSCOPIC
Bilirubin Urine: NEGATIVE
Glucose, UA: NEGATIVE mg/dL
Hgb urine dipstick: NEGATIVE
Ketones, ur: NEGATIVE mg/dL
Leukocytes,Ua: NEGATIVE
Nitrite: NEGATIVE
Protein, ur: NEGATIVE mg/dL
Specific Gravity, Urine: 1.025 (ref 1.005–1.030)
pH: 5 (ref 5.0–8.0)

## 2020-03-26 LAB — I-STAT BETA HCG BLOOD, ED (MC, WL, AP ONLY): I-stat hCG, quantitative: <5 m[IU]/mL (ref <5)

## 2020-03-26 MED ORDER — FLUCONAZOLE 150 MG PO TABS
150.0000 mg | ORAL_TABLET | Freq: Once | ORAL | Status: AC
  Administered 2020-03-26: 150 mg via ORAL
  Filled 2020-03-26: qty 1

## 2020-03-26 MED ORDER — IBUPROFEN 400 MG PO TABS
600.0000 mg | ORAL_TABLET | Freq: Once | ORAL | Status: AC
  Administered 2020-03-26: 600 mg via ORAL
  Filled 2020-03-26: qty 1

## 2020-03-26 NOTE — ED Triage Notes (Signed)
Pt here with c/o burring upon urination and some slight vaginal discharge

## 2020-03-26 NOTE — ED Notes (Signed)
Pelvic cart outside pts room.

## 2020-03-26 NOTE — Discharge Instructions (Signed)
Your examination is most consistent with yeast infection. You were treated for this in the ED with a tablet of diflucan.  Wear breathable cotton underwear and avoid excessive moisture in the meantime.   Follow up with [facility] STD clinic to be screened for HIV in the future and for future STD concerns or screenings.   You will receive a phone call if any of your cultures today come back positive.  You can also view your results on MyChart.  Return to the emergency department if any concerning signs or symptoms develop such as fevers, vomiting, flank pain

## 2020-03-26 NOTE — ED Notes (Signed)
Patient verbalizes understanding of discharge instructions. Opportunity for questioning and answers were provided. Armband removed by staff, pt discharged from ED ambulatory to home.  

## 2020-03-26 NOTE — ED Notes (Signed)
Pt outside in pt car. Call or text pt with update for room. Will come back in.

## 2020-03-26 NOTE — ED Provider Notes (Signed)
[facility] EMERGENCY DEPARTMENT Provider Note   CSN: [id number] Arrival date & time: 03/26/20  0086     History No chief complaint on file.   Pamela Klein is a 29 y.o. female with history of chronic back pain, hypertension presents for evaluation of acute onset, persistent vaginal itching and vaginal discharge for 2 to 3 weeks. She reports the discharge is thick and white. She noticed some burning with urination around the urethra. Denies urethral urgency, frequency or hematuria. She has had some intermittent lower abdominal cramping radiating to the back which is well controlled with rest and ibuprofen. She has not had any ibuprofen in several days but does note a little bit of mild abdominal cramping at this time and is requesting some ibuprofen here. She denies fevers, nausea, vomiting, diarrhea, constipation. No recent swimming or recent treatment with antibiotics. She uses condoms while sexually active and states she has a low suspicion of STDs but is open to being tested today.  The history is provided by the patient.       Past Medical History:  Diagnosis Date  . Chest pain on exertion    "the last couple of days; I've been doing alot of coughing" (05/01/2013)  . Chlamydia   . Chronic back pain   . Daily headache    "last few days; since I've been sick" (05/01/2013)  . H/O candidiasis   . H/O varicella   . Hypertension   . Pneumonia    "now's the first time" (05/01/2013)  . Shortness of breath    "all the time right now" (05/01/2013)    Patient Active Problem List   Diagnosis Date Noted  . Stomach cramps 11/28/2017  . Bacterial vaginosis 11/28/2017  . Vitamin D deficiency 11/28/2017  . IUD threads lost 11/28/2017  . UTI (urinary tract infection) 05/02/2013  . Sepsis (HCC) 05/01/2013  . Community acquired pneumonia 05/01/2013  . Chest pain 05/01/2013  . Cannabis abuse 05/01/2013  . Precipitous delivery, delivered (current hospitalization)  02/29/2012  . Avulsion of umbilical cord 02/29/2012  . Meconium in amniotic fluid affecting management of mother, delivered 02/29/2012  . Pregnancy, first 02/05/2012  . Gastroenteritis or enteritis 12/21/2011  . Rubella non-immune status 12/10/2011    Past Surgical History:  Procedure Laterality Date  . INTRAUTERINE DEVICE INSERTION  03/2012     OB History    Gravida  1   Para  1   Term  1   Preterm  0   AB  0   Living  1     SAB  0   TAB  0   Ectopic  0   Multiple  0   Live Births  1           Family History  Problem Relation Age of Onset  . Hypertension Father   . Diabetes Paternal Grandmother     Social History   Tobacco Use  . Smoking status: Former Smoker    Years: 3.00    Quit date: 04/06/2011    Years since quitting: 8.9  . Smokeless tobacco: Never Used  . Tobacco comment: 05/01/2013 "smoked Black N Milds"  Substance Use Topics  . Alcohol use: Yes    Alcohol/week: 0.0 standard drinks    Comment: 05/01/2013 "none recently" -occasional  . Drug use: No    Home Medications Prior to Admission medications   Medication Sig Start Date End Date Taking? Authorizing Provider  cyclobenzaprine (FLEXERIL) 10 MG tablet Take 1 tablet (10  mg total) by mouth at bedtime as needed for muscle spasms. 12/30/17   Wurst, [place], PA-C  ibuprofen (ADVIL,MOTRIN) 600 MG tablet Take 1 tablet (600 mg total) by mouth every 6 (six) hours as needed. 11/28/17   Aviva Signs, CNM  metroNIDAZOLE (FLAGYL) 500 MG tablet Take 1 tablet (500 mg total) by mouth 2 (two) times daily. 12/01/17   Aviva Signs, CNM  naproxen (NAPROSYN) 375 MG tablet Take 1 tablet (375 mg total) by mouth 2 (two) times daily. 12/30/17   Wurst, [place], PA-C  norethindrone-ethinyl estradiol 1/35 (ORTHO-NOVUM 1/35, 28,) tablet Take 1 tablet by mouth daily. Patient not taking: Reported on 11/28/2017 10/19/16   Brock Bad, MD    Allergies    Patient has no known allergies.  Review of Systems    Review of Systems  Constitutional: Negative for fever.  Respiratory: Negative for shortness of breath.   Cardiovascular: Negative for chest pain.  Gastrointestinal: Positive for abdominal pain (lower abdominal cramping). Negative for constipation, diarrhea, nausea and vomiting.  Genitourinary: Positive for dysuria and vaginal discharge. Negative for frequency, hematuria, urgency, vaginal bleeding and vaginal pain.  All other systems reviewed and are negative.   Physical Exam Updated Vital Signs BP (!) 146/90 (BP Location: Left Arm)   Pulse 84   Temp 98.2 F (36.8 C) (Oral)   Resp 16   Ht 5\' 4"  (1.626 m)   Wt 79.4 kg   SpO2 100%   BMI 30.04 kg/m   Physical Exam Vitals and nursing note reviewed.  Constitutional:      General: She is not in acute distress.    Appearance: She is well-developed.  HENT:     Head: Normocephalic and atraumatic.  Eyes:     General:        Right eye: No discharge.        Left eye: No discharge.     Conjunctiva/sclera: Conjunctivae normal.  Neck:     Vascular: No JVD.     Trachea: No tracheal deviation.  Cardiovascular:     Rate and Rhythm: Normal rate.  Pulmonary:     Effort: Pulmonary effort is normal.  Abdominal:     General: Abdomen is flat. There is no distension.     Palpations: Abdomen is soft.     Tenderness: There is no abdominal tenderness. There is no right CVA tenderness, left CVA tenderness, guarding or rebound.  Genitourinary:    Comments: Examination performed in the presence of a chaperone. No masses or lesions noted to the external genitalia. No inguinal lymphadenopathy. Moderate amount of chunky white discharge and thinner yellow-white discharge in the vaginal vault. No cervical motion tenderness or adnexal tenderness. No cervical friability noted. Skin:    General: Skin is warm and dry.     Findings: No erythema.  Neurological:     Mental Status: She is alert.  Psychiatric:        Behavior: Behavior normal.     ED  Results / Procedures / Treatments   Labs (all labs ordered are listed, but only abnormal results are displayed) Labs Reviewed  WET PREP, GENITAL - Abnormal; Notable for the following components:      Result Value   WBC, Wet Prep HPF POC MANY (*)    All other components within normal limits  URINALYSIS, ROUTINE W REFLEX MICROSCOPIC - Abnormal; Notable for the following components:   APPearance HAZY (*)    All other components within normal limits  RPR  HIV ANTIBODY (ROUTINE  TESTING W REFLEX)  I-STAT BETA HCG BLOOD, ED ([facility], [facility], [facility] ONLY)  GC/CHLAMYDIA PROBE AMP ([place]) NOT AT [facility]    EKG None  Radiology No results found.  Procedures Procedures (including critical care time)  Medications Ordered in ED Medications  fluconazole (DIFLUCAN) tablet 150 mg (has no administration in time range)  ibuprofen (ADVIL) tablet 600 mg (600 mg Oral Given 03/26/20 1721)    ED Course  I have reviewed the triage vital signs and the nursing notes.  Pertinent labs & imaging results that were available during my care of the patient were reviewed by me and considered in my medical decision making (see chart for details).    MDM Rules/Calculators/A&P                          Patient presenting for evaluation of vaginal itching and discharge.  Notes some discomfort with urination but denies any other urinary symptoms.  She is afebrile, vital signs are stable.  She is nontoxic in appearance.  Her abdomen is soft and nontender with no rebound or guarding.  Doubt acute surgical abdominal pathology.  Her pregnancy test is negative.  Doubt ectopic pregnancy, TOA and physical examination does not suggest PID.  Physical examination most consistent with yeast infection.  She does have many WBCs on wet prep but no evidence of BV.  Cultures obtained including HIV, syphilis, gonorrhea and chlamydia.  Her UA does not suggest UTI or nephrolithiasis.  Will treat with single dose of Diflucan in the ED.  Recommend  follow-up with PCP or the [facility] department in future for any further testing or treatment.  Patient in agreement with plan and hemodynamically stable for discharge at this time.   Final Clinical Impression(s) / ED Diagnoses Final diagnoses:  Vaginal itching  Vaginal discharge    Rx / DC Orders ED Discharge Orders    None       Bennye Alm 03/26/20 1754    Raeford Razor, MD 03/27/20 2000

## 2020-03-27 LAB — GC/CHLAMYDIA PROBE AMP (~~LOC~~) NOT AT ARMC
Chlamydia: NEGATIVE
Comment: NEGATIVE
Comment: NORMAL
Neisseria Gonorrhea: NEGATIVE

## 2020-03-27 LAB — RPR: RPR Ser Ql: NONREACTIVE

## 2020-04-26 DIAGNOSIS — N760 Acute vaginitis: Secondary | ICD-10-CM | POA: Diagnosis not present

## 2020-04-26 DIAGNOSIS — N898 Other specified noninflammatory disorders of vagina: Secondary | ICD-10-CM | POA: Diagnosis not present

## 2020-05-14 DIAGNOSIS — Z13220 Encounter for screening for lipoid disorders: Secondary | ICD-10-CM | POA: Diagnosis not present

## 2020-05-14 DIAGNOSIS — Z0000 Encounter for general adult medical examination without abnormal findings: Secondary | ICD-10-CM | POA: Diagnosis not present

## 2020-05-14 DIAGNOSIS — N760 Acute vaginitis: Secondary | ICD-10-CM | POA: Diagnosis not present

## 2020-07-23 DIAGNOSIS — N898 Other specified noninflammatory disorders of vagina: Secondary | ICD-10-CM | POA: Diagnosis not present

## 2020-08-25 DIAGNOSIS — Z20822 Contact with and (suspected) exposure to covid-19: Secondary | ICD-10-CM | POA: Diagnosis not present

## 2020-09-20 DIAGNOSIS — N760 Acute vaginitis: Secondary | ICD-10-CM | POA: Diagnosis not present

## 2020-09-20 DIAGNOSIS — R300 Dysuria: Secondary | ICD-10-CM | POA: Diagnosis not present

## 2020-10-11 DIAGNOSIS — A599 Trichomoniasis, unspecified: Secondary | ICD-10-CM | POA: Diagnosis not present

## 2020-10-11 DIAGNOSIS — N760 Acute vaginitis: Secondary | ICD-10-CM | POA: Diagnosis not present

## 2020-10-11 DIAGNOSIS — N898 Other specified noninflammatory disorders of vagina: Secondary | ICD-10-CM | POA: Diagnosis not present

## 2020-10-30 DIAGNOSIS — N898 Other specified noninflammatory disorders of vagina: Secondary | ICD-10-CM | POA: Diagnosis not present

## 2020-10-30 DIAGNOSIS — R3915 Urgency of urination: Secondary | ICD-10-CM | POA: Diagnosis not present

## 2020-10-30 DIAGNOSIS — Z113 Encounter for screening for infections with a predominantly sexual mode of transmission: Secondary | ICD-10-CM | POA: Diagnosis not present

## 2021-04-15 DIAGNOSIS — R300 Dysuria: Secondary | ICD-10-CM | POA: Diagnosis not present

## 2021-04-15 DIAGNOSIS — N760 Acute vaginitis: Secondary | ICD-10-CM | POA: Diagnosis not present

## 2021-07-12 DIAGNOSIS — N760 Acute vaginitis: Secondary | ICD-10-CM | POA: Diagnosis not present

## 2021-07-12 DIAGNOSIS — Z113 Encounter for screening for infections with a predominantly sexual mode of transmission: Secondary | ICD-10-CM | POA: Diagnosis not present

## 2021-07-12 DIAGNOSIS — N390 Urinary tract infection, site not specified: Secondary | ICD-10-CM | POA: Diagnosis not present

## 2021-07-12 DIAGNOSIS — R109 Unspecified abdominal pain: Secondary | ICD-10-CM | POA: Diagnosis not present

## 2021-12-20 DIAGNOSIS — R110 Nausea: Secondary | ICD-10-CM | POA: Diagnosis not present

## 2021-12-20 DIAGNOSIS — R519 Headache, unspecified: Secondary | ICD-10-CM | POA: Diagnosis not present

## 2021-12-20 DIAGNOSIS — J020 Streptococcal pharyngitis: Secondary | ICD-10-CM | POA: Diagnosis not present

## 2021-12-20 DIAGNOSIS — Z20822 Contact with and (suspected) exposure to covid-19: Secondary | ICD-10-CM | POA: Diagnosis not present

## 2022-09-23 ENCOUNTER — Ambulatory Visit: Payer: Self-pay

## 2022-09-23 DIAGNOSIS — R0981 Nasal congestion: Secondary | ICD-10-CM | POA: Diagnosis not present

## 2022-09-23 DIAGNOSIS — J029 Acute pharyngitis, unspecified: Secondary | ICD-10-CM | POA: Diagnosis not present

## 2022-09-23 DIAGNOSIS — R509 Fever, unspecified: Secondary | ICD-10-CM | POA: Diagnosis not present

## 2022-09-23 DIAGNOSIS — J069 Acute upper respiratory infection, unspecified: Secondary | ICD-10-CM | POA: Diagnosis not present

## 2022-10-31 DIAGNOSIS — M546 Pain in thoracic spine: Secondary | ICD-10-CM | POA: Diagnosis not present

## 2022-11-03 DIAGNOSIS — Z1330 Encounter for screening examination for mental health and behavioral disorders, unspecified: Secondary | ICD-10-CM | POA: Diagnosis not present

## 2022-11-03 DIAGNOSIS — M7918 Myalgia, other site: Secondary | ICD-10-CM | POA: Diagnosis not present

## 2022-12-07 DIAGNOSIS — Z0184 Encounter for antibody response examination: Secondary | ICD-10-CM | POA: Diagnosis not present

## 2022-12-07 DIAGNOSIS — Z6835 Body mass index (BMI) 35.0-35.9, adult: Secondary | ICD-10-CM | POA: Diagnosis not present

## 2023-03-17 ENCOUNTER — Ambulatory Visit: Payer: Managed Care, Other (non HMO)

## 2023-03-18 ENCOUNTER — Other Ambulatory Visit (HOSPITAL_COMMUNITY)
Admission: RE | Admit: 2023-03-18 | Discharge: 2023-03-18 | Disposition: A | Discharge status: Home / Self Care | Payer: Medicaid Other | Source: Ambulatory Visit | Attending: Obstetrics and Gynecology | Admitting: Obstetrics and Gynecology

## 2023-03-18 ENCOUNTER — Ambulatory Visit: Payer: Medicaid Other

## 2023-03-18 VITALS — BP 124/80 | HR 78 | Wt 191.0 lb

## 2023-03-18 DIAGNOSIS — N898 Other specified noninflammatory disorders of vagina: Secondary | ICD-10-CM

## 2023-03-18 NOTE — Progress Notes (Signed)
SUBJECTIVE:  32 y.o. female complains of clear vaginal discharge for 2 week(s). Denies abnormal vaginal bleeding or significant pelvic pain or fever. No UTI symptoms. Denies history of known exposure to STD.  Patient's last menstrual period was 12/29/2022 (exact date).  OBJECTIVE:  She appears well, afebrile. Urine dipstick: not done.  ASSESSMENT:  Vaginal Discharge  Vaginal Odor   PLAN:  GC, chlamydia, trichomonas, BVAG, CVAG probe sent to lab. Treatment: To be determined once lab results are received ROV prn if symptoms persist or worsen.   Cope Marte l Karell Tukes, CMA

## 2023-03-21 ENCOUNTER — Telehealth: Payer: Self-pay

## 2023-03-21 DIAGNOSIS — N760 Acute vaginitis: Secondary | ICD-10-CM

## 2023-03-21 LAB — CERVICOVAGINAL ANCILLARY ONLY
Bacterial Vaginitis (gardnerella): POSITIVE — AB
Candida Glabrata: NEGATIVE
Candida Vaginitis: NEGATIVE
Chlamydia: NEGATIVE
Comment: NEGATIVE
Comment: NEGATIVE
Comment: NEGATIVE
Comment: NEGATIVE
Comment: NEGATIVE
Comment: NORMAL
Neisseria Gonorrhea: NEGATIVE
Trichomonas: NEGATIVE

## 2023-03-21 MED ORDER — METRONIDAZOLE 500 MG PO TABS: 500.0000 mg | ORAL_TABLET | Freq: Two times a day (BID) | ORAL | 0 refills | Status: DC

## 2023-03-21 NOTE — Telephone Encounter (Signed)
Called patient to inform her that she tested positive for BV. Left message for patient to call the office back. chiquita l wilson, CMA

## 2023-03-21 NOTE — Telephone Encounter (Signed)
Flagyl 500 mg BID x 7 days was sent to her pharmacy. Mortimer Bair l Chantz Montefusco, CMA

## 2023-03-24 ENCOUNTER — Telehealth: Payer: Self-pay

## 2023-03-24 NOTE — Telephone Encounter (Signed)
Returned patient's call about Babyscripts. Left message for patient to call the office back. Rayshaun Needle l Nickholas Goldston, CMA

## 2023-03-24 NOTE — Telephone Encounter (Signed)
-----   Message from [place] M sent at 03/24/2023  9:50 AM EDT ----- Regarding: Blood pressure cuff Patient was trying to set up her blood pressure cuff and the babyscripts app. She said it was telling her see needed a code and she didn't have one. Needs code to get everything set up.   Best contact number is [phone].

## 2023-04-07 ENCOUNTER — Other Ambulatory Visit (HOSPITAL_COMMUNITY)
Admission: RE | Admit: 2023-04-07 | Payer: Medicaid Other | Source: Ambulatory Visit | Admitting: Obstetrics & Gynecology

## 2023-04-07 ENCOUNTER — Ambulatory Visit (INDEPENDENT_AMBULATORY_CARE_PROVIDER_SITE_OTHER): Payer: Medicaid Other | Admitting: Obstetrics & Gynecology

## 2023-04-07 ENCOUNTER — Encounter: Payer: Self-pay | Admitting: Obstetrics & Gynecology

## 2023-04-07 VITALS — BP 128/68 | HR 80 | Wt 197.0 lb

## 2023-04-07 DIAGNOSIS — Z1339 Encounter for screening examination for other mental health and behavioral disorders: Secondary | ICD-10-CM

## 2023-04-07 DIAGNOSIS — T753XXA Motion sickness, initial encounter: Secondary | ICD-10-CM

## 2023-04-07 DIAGNOSIS — O99210 Obesity complicating pregnancy, unspecified trimester: Secondary | ICD-10-CM

## 2023-04-07 DIAGNOSIS — O99212 Obesity complicating pregnancy, second trimester: Secondary | ICD-10-CM

## 2023-04-07 DIAGNOSIS — Z3480 Encounter for supervision of other normal pregnancy, unspecified trimester: Secondary | ICD-10-CM | POA: Insufficient documentation

## 2023-04-07 DIAGNOSIS — Z8759 Personal history of other complications of pregnancy, childbirth and the puerperium: Secondary | ICD-10-CM

## 2023-04-07 DIAGNOSIS — O0993 Supervision of high risk pregnancy, unspecified, third trimester: Secondary | ICD-10-CM | POA: Insufficient documentation

## 2023-04-07 DIAGNOSIS — Z3A14 14 weeks gestation of pregnancy: Secondary | ICD-10-CM | POA: Insufficient documentation

## 2023-04-07 DIAGNOSIS — O219 Vomiting of pregnancy, unspecified: Secondary | ICD-10-CM | POA: Diagnosis not present

## 2023-04-07 DIAGNOSIS — R7303 Prediabetes: Secondary | ICD-10-CM

## 2023-04-07 MED ORDER — ASPIRIN 81 MG PO TBEC: 81.0000 mg | DELAYED_RELEASE_TABLET | Freq: Every day | ORAL | 2 refills | Status: DC

## 2023-04-07 MED ORDER — SCOPOLAMINE 1 MG/3DAYS TD PT72: 1.0000 | MEDICATED_PATCH | TRANSDERMAL | 12 refills | Status: DC

## 2023-04-07 MED ORDER — ONDANSETRON 4 MG PO TBDP: 4.0000 mg | ORAL_TABLET | Freq: Four times a day (QID) | ORAL | 1 refills | Status: DC | PRN

## 2023-04-07 NOTE — Addendum Note (Signed)
Addended by: Jaynie Collins A on: 04/07/2023 03:26 PM   Modules accepted: Orders

## 2023-04-07 NOTE — Progress Notes (Signed)
History:   Pamela Klein is a 32 y.o. G2P1001 at [redacted]w[redacted]d by LMP being seen today for her first obstetrical visit.  Her obstetrical history is significant for  history of GHTN; had one term vaginal delivery . Patient is accompanied by her FOB.  Patient reports no complaints.      HISTORY: OB History  Gravida Para Term Preterm AB Living  2 1 1  0 0 1  SAB IAB Ectopic Multiple Live Births  0 0 0 0 1    # Outcome Date GA Lbr Len/2nd Weight Sex Type Anes PTL Lv  2 Current           1 Term 02/27/12 [redacted]w[redacted]d   F Vag-Spont Local  LIV     Name: SCHWANDA, JARRATT     Apgar1: 8  Apgar5: 9    Last pap smear was done many years ago and was normal  Past Medical History:  Diagnosis Date   Cannabis abuse 05/01/2013   Chlamydia    Chronic back pain    Community acquired pneumonia 05/01/2013   H/O varicella    Sepsis (HCC) 05/01/2013   Vitamin D deficiency 11/28/2017   History reviewed. No pertinent surgical history. Family History  Problem Relation Age of Onset   Hypertension Father    Diabetes Paternal Grandmother    Social History   Tobacco Use   Smoking status: Former    Current packs/day: 0.00    Types: Cigarettes    Start date: 04/05/2008    Quit date: 04/06/2011    Years since quitting: 12.0   Smokeless tobacco: Never   Tobacco comments:    05/01/2013 "smoked Black N Milds"  Vaping Use   Vaping status: Never Used  Substance Use Topics   Alcohol use: Yes    Alcohol/week: 0.0 standard drinks of alcohol    Comment: 05/01/2013 "none recently" -occasional   Drug use: No   No Known Allergies Current Outpatient Medications on File Prior to Visit  Medication Sig Dispense Refill   Prenatal Vit-Fe Fumarate-FA (MULTIVITAMIN-PRENATAL) 27-0.8 MG TABS tablet Take 1 tablet by mouth daily at 12 noon.     metroNIDAZOLE (FLAGYL) 500 MG tablet Take 1 tablet (500 mg total) by mouth 2 (two) times daily. 14 tablet 0   No current facility-administered medications on file prior to  visit.    Review of Systems Pertinent items noted in HPI and remainder of comprehensive ROS otherwise negative.  Physical Exam:   Vitals:   04/07/23 1322  BP: 128/68  Pulse: 80  Weight: 197 lb (89.4 kg)   Fetal Heart Rate (bpm): 150  General: well-developed, well-nourished female in no acute distress  Breasts:  deferred  Skin: normal coloration and turgor, no rashes  Neurologic: oriented, normal, negative, normal mood  Extremities: normal strength, tone, and muscle mass, ROM of all joints is normal  HEENT PERRLA, extraocular movement intact and sclera clear, anicteric  Neck supple and no masses  Cardiovascular: regular rate and rhythm  Respiratory:  no respiratory distress, normal breath sounds  Abdomen: soft, non-tender; bowel sounds normal; no masses,  no organomegaly  Pelvic: normal external genitalia, no lesions, normal vaginal mucosa, normal vaginal discharge, normal cervix, pap smear done. Exam done in the presence of a chaperone.     Assessment:    Pregnancy: G2P1001 Patient Active Problem List   Diagnosis Date Noted   History of gestational hypertension 04/07/2023   Supervision of normal pregnancy, antepartum 04/07/2023   Obesity in pregnancy, antepartum 04/07/2023  Plan:    1. History of gestational hypertension Labs done. Low dose ASA prescribed for preeclampsia prophylaxis.  - Comprehensive metabolic panel - Protein / creatinine ratio, urine - aspirin EC 81 MG tablet; Take 1 tablet (81 mg total) by mouth at bedtime. Please take daily from now until end of pregnancy for prevention of preeclampsia  Dispense: 300 tablet; Refill: 2 - [place] MFM OB DETAIL +14 WK; Future  2. Obesity in pregnancy, antepartum Surveillance labs checked.  TWG recommended is 11-20 lbs.  - Hemoglobin A1c - Comprehensive metabolic panel - Protein / creatinine ratio, urine - TSH Rfx on Abnormal to Free T4 - aspirin EC 81 MG tablet; Take 1 tablet (81 mg total) by mouth at bedtime.  Please take daily from now until end of pregnancy for prevention of preeclampsia  Dispense: 300 tablet; Refill: 2 - [place] MFM OB DETAIL +14 WK; Future  3. Supervision of normal pregnancy, antepartum 4. [redacted] weeks gestation of pregnancy  - CBC/D/Plt+RPR+Rh+ABO+RubIgG... - Culture, OB Urine - Cytology - PAP - PANORAMA PRENATAL TEST - HORIZON CUSTOM  Initial labs drawn. Continue prenatal vitamins. Problem list reviewed and updated. Genetic Screening discussed, Panorama and Horizon: ordered. Ultrasound discussed; fetal anatomic survey: scheduled. Anticipatory guidance about prenatal visits given including labs, ultrasounds, and testing. Weight gain recommendations per IOM guidelines reviewed: underweight/BMI 18.5 or less > 28 - 40 lbs; normal weight/BMI 18.5 - 24.9 > 25 - 35 lbs; overweight/BMI 25 - 29.9 > 15 - 25 lbs; obese/BMI  30 or more > 11 - 20 lbs. Discussed usage of the Babyscripts app for more information about pregnancy, and to track blood pressures. Also discussed usage of virtual visits as additional source of managing and completing prenatal visits.  Patient was encouraged to use MyChart to review results, send requests, and have questions addressed.   The nature of Center for [facility] with multiple MDs and Advanced Practice Providers was explained to patient; also emphasized that residents, students are part of our team. Routine obstetric precautions reviewed. Encouraged to seek out care at our office or emergency room [facility] preferred) for urgent and/or emergent concerns. Return in about 4 weeks (around 05/05/2023) for OFFICE OB VISIT (MD only).     Jaynie Collins, MD, FACOG Obstetrician & Gynecologist, [facility] for [employer], [facility]

## 2023-04-07 NOTE — Progress Notes (Addendum)
Patient is traveling soon and going on a cruise, desires medication for motion sickness.  Zofran and Scopolamine patch prescribed for patient.  Jaynie Collins, MD

## 2023-04-12 ENCOUNTER — Other Ambulatory Visit: Payer: Self-pay

## 2023-04-12 DIAGNOSIS — Z3A14 14 weeks gestation of pregnancy: Secondary | ICD-10-CM

## 2023-04-19 ENCOUNTER — Telehealth: Payer: Self-pay

## 2023-04-19 DIAGNOSIS — R7303 Prediabetes: Secondary | ICD-10-CM | POA: Insufficient documentation

## 2023-04-19 NOTE — Telephone Encounter (Signed)
-----   Message from Pamela Klein sent at 04/19/2023  3:42 PM EDT ----- Patient has prediabetes, early GTT recommended as soon as possible.   Please call to inform patient of results and recommendations.

## 2023-04-19 NOTE — Telephone Encounter (Signed)
Called patient to inform her that her Hemoglobin A1C is elevated and to schedule her for an early 2 hr GTT. Left message for patient to call the office back.Pamela Klein l Pamela Klein, CMA

## 2023-04-28 ENCOUNTER — Encounter: Payer: Managed Care, Other (non HMO) | Admitting: Family Medicine

## 2023-05-05 ENCOUNTER — Ambulatory Visit (INDEPENDENT_AMBULATORY_CARE_PROVIDER_SITE_OTHER): Payer: Medicaid Other | Admitting: Family Medicine

## 2023-05-05 ENCOUNTER — Other Ambulatory Visit (HOSPITAL_COMMUNITY)
Admission: RE | Admit: 2023-05-05 | Discharge: 2023-05-05 | Disposition: A | Discharge status: Home / Self Care | Payer: Medicaid Other | Source: Ambulatory Visit | Attending: Family Medicine | Admitting: Family Medicine

## 2023-05-05 ENCOUNTER — Encounter: Payer: Managed Care, Other (non HMO) | Admitting: Family Medicine

## 2023-05-05 VITALS — BP 129/78 | HR 86 | Wt 198.0 lb

## 2023-05-05 DIAGNOSIS — O26899 Other specified pregnancy related conditions, unspecified trimester: Secondary | ICD-10-CM | POA: Diagnosis not present

## 2023-05-05 DIAGNOSIS — N898 Other specified noninflammatory disorders of vagina: Secondary | ICD-10-CM | POA: Diagnosis present

## 2023-05-05 DIAGNOSIS — Z3480 Encounter for supervision of other normal pregnancy, unspecified trimester: Secondary | ICD-10-CM

## 2023-05-05 DIAGNOSIS — Z8759 Personal history of other complications of pregnancy, childbirth and the puerperium: Secondary | ICD-10-CM

## 2023-05-05 DIAGNOSIS — O99210 Obesity complicating pregnancy, unspecified trimester: Secondary | ICD-10-CM

## 2023-05-05 NOTE — Progress Notes (Signed)
Patient would like to do self swab for yeast infection. She was recently treated for BV. Patient has had some discharge and little bit of smell. Armandina Stammer RN

## 2023-05-05 NOTE — Progress Notes (Signed)
   PRENATAL VISIT NOTE  Subjective:  Pamela Klein is a 32 y.o. G2P1001 at [redacted]w[redacted]d being seen today for ongoing prenatal care.  She is currently monitored for the following issues for this low-risk pregnancy and has History of gestational hypertension; Supervision of normal pregnancy, antepartum; Obesity in pregnancy, antepartum; and Prediabetes on their problem list.  Patient reports vaginal irritation.   . Vag. Bleeding: None.  Movement: Present. Denies leaking of fluid.   The following portions of the patient's history were reviewed and updated as appropriate: allergies, current medications, past family history, past medical history, past social history, past surgical history and problem list.   Objective:   Vitals:   05/05/23 0819  BP: 129/78  Pulse: 86  Weight: 198 lb (89.8 kg)    Fetal Status:     Movement: Present     General:  Alert, oriented and cooperative. Patient is in no acute distress.  Skin: Skin is warm and dry. No rash noted.   Cardiovascular: Normal heart rate noted  Respiratory: Normal respiratory effort, no problems with respiration noted  Abdomen: Soft, gravid, appropriate for gestational age.  Pain/Pressure: Present     Pelvic: Cervical exam deferred        Extremities: Normal range of motion.  Edema: None  Mental Status: Normal mood and affect. Normal behavior. Normal judgment and thought content.   Assessment and Plan:  Pregnancy: G2P1001 at [redacted]w[redacted]d 1. Supervision of normal pregnancy, antepartum FHT normal LR nips TSH norma  2. Vaginal discharge during pregnancy, antepartum - Cervicovaginal ancillary only( [place])  3. History of gestational hypertension BP normal On ASA 81mg   4. Obesity in pregnancy, antepartum   Preterm labor symptoms and general obstetric precautions including but not limited to vaginal bleeding, contractions, leaking of fluid and fetal movement were reviewed in detail with the patient. Please refer to After Visit Summary  for other counseling recommendations.   No follow-ups on file.  Future Appointments  Date Time Provider Department Center  05/12/2023  7:30 AM [facility] NURSE [facility]  05/12/2023  7:45 AM [facility] US4 [facility]  05/26/2023  8:35 AM Levie Heritage, DO [facility] None  06/30/2023  8:15 AM Adrian Blackwater Rhona Raider, DO [facility] None    Levie Heritage, DO

## 2023-05-06 ENCOUNTER — Encounter: Payer: Self-pay | Admitting: Family Medicine

## 2023-05-07 ENCOUNTER — Other Ambulatory Visit: Payer: Self-pay

## 2023-05-07 ENCOUNTER — Emergency Department (HOSPITAL_BASED_OUTPATIENT_CLINIC_OR_DEPARTMENT_OTHER)
Admission: EM | Admit: 2023-05-07 | Discharge: 2023-05-07 | Disposition: A | Discharge status: Home / Self Care | Payer: Medicaid Other | Attending: Emergency Medicine | Admitting: Emergency Medicine

## 2023-05-07 DIAGNOSIS — Z7982 Long term (current) use of aspirin: Secondary | ICD-10-CM | POA: Diagnosis not present

## 2023-05-07 DIAGNOSIS — R42 Dizziness and giddiness: Secondary | ICD-10-CM | POA: Insufficient documentation

## 2023-05-07 DIAGNOSIS — Z3A18 18 weeks gestation of pregnancy: Secondary | ICD-10-CM | POA: Diagnosis not present

## 2023-05-07 DIAGNOSIS — R519 Headache, unspecified: Secondary | ICD-10-CM | POA: Insufficient documentation

## 2023-05-07 DIAGNOSIS — J029 Acute pharyngitis, unspecified: Secondary | ICD-10-CM | POA: Insufficient documentation

## 2023-05-07 DIAGNOSIS — O26892 Other specified pregnancy related conditions, second trimester: Secondary | ICD-10-CM | POA: Insufficient documentation

## 2023-05-07 DIAGNOSIS — Z1152 Encounter for screening for COVID-19: Secondary | ICD-10-CM | POA: Diagnosis not present

## 2023-05-07 LAB — URINALYSIS, ROUTINE W REFLEX MICROSCOPIC
Bilirubin Urine: NEGATIVE
Glucose, UA: NEGATIVE mg/dL
Hgb urine dipstick: NEGATIVE
Ketones, ur: NEGATIVE mg/dL
Leukocytes,Ua: NEGATIVE
Nitrite: NEGATIVE
Protein, ur: NEGATIVE mg/dL
Specific Gravity, Urine: 1.027 (ref 1.005–1.030)
pH: 6 (ref 5.0–8.0)

## 2023-05-07 LAB — BASIC METABOLIC PANEL
Anion gap: 7 (ref 5–15)
BUN: 9 mg/dL (ref 6–20)
CO2: 24 mmol/L (ref 22–32)
Calcium: 8.4 mg/dL — ABNORMAL LOW (ref 8.9–10.3)
Chloride: 105 mmol/L (ref 98–111)
Creatinine, Ser: 0.43 mg/dL — ABNORMAL LOW (ref 0.44–1.00)
GFR, Estimated: >60 mL/min (ref >60)
Glucose, Bld: 113 mg/dL — ABNORMAL HIGH (ref 70–99)
Potassium: 3.4 mmol/L — ABNORMAL LOW (ref 3.5–5.1)
Sodium: 136 mmol/L (ref 135–145)

## 2023-05-07 LAB — CBC WITH DIFFERENTIAL/PLATELET
Abs Immature Granulocytes: 0.05 10*3/uL (ref 0.00–0.07)
Basophils Absolute: 0 10*3/uL (ref 0.0–0.1)
Basophils Relative: 0 %
Eosinophils Absolute: 0.1 10*3/uL (ref 0.0–0.5)
Eosinophils Relative: 1 %
HCT: 34.4 % — ABNORMAL LOW (ref 36.0–46.0)
Hemoglobin: 11.6 g/dL — ABNORMAL LOW (ref 12.0–15.0)
Immature Granulocytes: 1 %
Lymphocytes Relative: 20 %
Lymphs Abs: 1.7 10*3/uL (ref 0.7–4.0)
MCH: 29.4 pg (ref 26.0–34.0)
MCHC: 33.7 g/dL (ref 30.0–36.0)
MCV: 87.3 fL (ref 80.0–100.0)
Monocytes Absolute: 0.8 10*3/uL (ref 0.1–1.0)
Monocytes Relative: 9 %
Neutro Abs: 5.8 10*3/uL (ref 1.7–7.7)
Neutrophils Relative %: 69 %
Platelets: 214 10*3/uL (ref 150–400)
RBC: 3.94 MIL/uL (ref 3.87–5.11)
RDW: 13.8 % (ref 11.5–15.5)
WBC: 8.4 10*3/uL (ref 4.0–10.5)
nRBC: 0 % (ref 0.0–0.2)

## 2023-05-07 LAB — RESP PANEL BY RT-PCR (RSV, FLU A&B, COVID)  RVPGX2
Influenza A by PCR: NEGATIVE
Influenza B by PCR: NEGATIVE
Resp Syncytial Virus by PCR: NEGATIVE
SARS Coronavirus 2 by RT PCR: NEGATIVE

## 2023-05-07 LAB — CBG MONITORING, ED: Glucose-Capillary: 126 mg/dL — ABNORMAL HIGH (ref 70–99)

## 2023-05-07 MED ORDER — ACETAMINOPHEN 325 MG PO TABS
650.0000 mg | ORAL_TABLET | Freq: Once | ORAL | Status: AC
  Administered 2023-05-07: 650 mg via ORAL
  Filled 2023-05-07: qty 2

## 2023-05-07 MED ORDER — SODIUM CHLORIDE 0.9 % IV BOLUS
1000.0000 mL | Freq: Once | INTRAVENOUS | Status: AC
  Administered 2023-05-07: 1000 mL via INTRAVENOUS

## 2023-05-07 NOTE — ED Notes (Signed)
Fetal heart rate at 155

## 2023-05-07 NOTE — ED Provider Notes (Signed)
[place] EMERGENCY DEPARTMENT AT [facility] Provider Note   CSN: [id number] Arrival date & time: 05/07/23  1710     History  No chief complaint on file.   Pamela Klein is a 32 y.o. female.  Patient who is approximately [redacted] weeks pregnant presents to the emergency department today for evaluation of headaches, sore throat, dizziness described as a lightheadedness with standing.  Symptoms started 2 days ago.  She has had decreased energy.  No ear pain, runny nose or congestion.  No cough.  No chest pain or shortness of breath.  No lower abdominal pain, back pain, vaginal bleeding or discharge.  She has not passed out.  She was concerned that her headaches were becoming a bit more frequent.  No complications thus far with pregnancy.  She states that she is due for an upcoming ultrasound.       Home Medications Prior to Admission medications   Medication Sig Start Date End Date Taking? Authorizing Provider  aspirin EC 81 MG tablet Take 1 tablet (81 mg total) by mouth at bedtime. Please take daily from now until end of pregnancy for prevention of preeclampsia 04/07/23   Anyanwu, Jethro Bastos, MD  metroNIDAZOLE (FLAGYL) 500 MG tablet Take 1 tablet (500 mg total) by mouth 2 (two) times daily. 03/21/23   Lorriane Shire, MD  ondansetron (ZOFRAN-ODT) 4 MG disintegrating tablet Take 1 tablet (4 mg total) by mouth every 6 (six) hours as needed for nausea. 04/07/23   Anyanwu, Jethro Bastos, MD  Prenatal Vit-Fe Fumarate-FA (MULTIVITAMIN-PRENATAL) 27-0.8 MG TABS tablet Take 1 tablet by mouth daily at 12 noon.    [provider]  scopolamine (TRANSDERM-SCOP) 1 MG/3DAYS Place 1 patch (1.5 mg total) onto the skin every 3 (three) days. 04/07/23   Tereso Newcomer, MD      Allergies    Patient has no known allergies.    Review of Systems   Review of Systems  Physical Exam Updated Vital Signs BP 134/71 (BP Location: Right Arm)   Pulse 91   Temp 99.2 F (37.3 C) (Oral)   Resp 17    Wt 91.4 kg   LMP 12/29/2022 (Exact Date)   SpO2 99%   BMI 34.59 kg/m  Physical Exam Vitals and nursing note reviewed.  Constitutional:      Appearance: She is well-developed.  HENT:     Head: Normocephalic and atraumatic.     Jaw: No trismus.     Right Ear: Tympanic membrane, ear canal and external ear normal.     Left Ear: Tympanic membrane, ear canal and external ear normal.     Nose: Nose normal. No mucosal edema or rhinorrhea.     Mouth/Throat:     Mouth: Mucous membranes are moist. Mucous membranes are not dry. No oral lesions.     Pharynx: Uvula midline. No oropharyngeal exudate, posterior oropharyngeal erythema or uvula swelling.     Tonsils: No tonsillar abscesses.  Eyes:     General:        Right eye: No discharge.        Left eye: No discharge.     Conjunctiva/sclera: Conjunctivae normal.  Cardiovascular:     Rate and Rhythm: Normal rate and regular rhythm.     Heart sounds: Normal heart sounds.  Pulmonary:     Effort: Pulmonary effort is normal. No respiratory distress.     Breath sounds: Normal breath sounds. No wheezing or rales.  Abdominal:     Palpations: Abdomen is soft.  Tenderness: There is no abdominal tenderness.  Musculoskeletal:     Cervical back: Normal range of motion and neck supple.  Lymphadenopathy:     Cervical: No cervical adenopathy.  Skin:    General: Skin is warm and dry.  Neurological:     General: No focal deficit present.     Mental Status: She is alert. Mental status is at baseline.     Motor: No weakness.  Psychiatric:        Mood and Affect: Mood normal.     ED Results / Procedures / Treatments   Labs (all labs ordered are listed, but only abnormal results are displayed) Labs Reviewed  CBC WITH DIFFERENTIAL/PLATELET - Abnormal; Notable for the following components:      Result Value   Hemoglobin 11.6 (*)    HCT 34.4 (*)    All other components within normal limits  BASIC METABOLIC PANEL - Abnormal; Notable for the  following components:   Potassium 3.4 (*)    Glucose, Bld 113 (*)    Creatinine, Ser 0.43 (*)    Calcium 8.4 (*)    All other components within normal limits  CBG MONITORING, ED - Abnormal; Notable for the following components:   Glucose-Capillary 126 (*)    All other components within normal limits  RESP PANEL BY RT-PCR (RSV, FLU A&B, COVID)  RVPGX2  URINALYSIS, ROUTINE W REFLEX MICROSCOPIC    EKG EKG Interpretation Date/Time:  Saturday May 07 2023 17:25:37 EDT Ventricular Rate:  90 PR Interval:  116 QRS Duration:  74 QT Interval:  360 QTC Calculation: 441 R Axis:   69  Text Interpretation: Sinus rhythm Confirmed by Virgina Norfolk [phone]) on 05/07/2023 5:29:19 PM  Radiology No results found.  Procedures Procedures    Medications Ordered in ED Medications  sodium chloride 0.9 % bolus 1,000 mL (1,000 mLs Intravenous New Bag/Given 05/07/23 1749)  acetaminophen (TYLENOL) tablet 650 mg (650 mg Oral Given 05/07/23 1904)    ED Course/ Medical Decision Making/ A&P    Patient seen and examined. History obtained directly from patient.   Bedside ultrasound shows good fetal movement, heart rate of 150-155.  Labs/EKG: Ordered CBC, BMP, UA, COVID testing  Imaging: None ordered  Medications/Fluids: Ordered: IV fluid bolus.   Most recent vital signs reviewed and are as follows: BP 134/71 (BP Location: Right Arm)   Pulse 91   Temp 99.2 F (37.3 C) (Oral)   Resp 17   Wt 91.4 kg   LMP 12/29/2022 (Exact Date)   SpO2 99%   BMI 34.59 kg/m   Initial impression: Sore throat, headache and pregnancy.  7:19 PM Reassessment performed. Patient appears comfortable.   Labs personally reviewed and interpreted including: CBC with hgb 11.6 otherwise unremarkable; BMP unremarkable; UA negative; Viral panel neg.   Reviewed pertinent lab work and imaging with patient at bedside. Questions answered.   Most current vital signs reviewed and are as follows: BP 117/66   Pulse 83    Temp 99.2 F (37.3 C) (Oral)   Resp (!) 23   Wt 91.4 kg   LMP 12/29/2022 (Exact Date)   SpO2 100%   BMI 34.59 kg/m   Plan: Discharge to home.   Prescriptions written for: None  Other home care instructions discussed: Rest, hydration  ED return instructions discussed: New or worsening symptoms, abdominal pain, back pain, vaginal bleeding, fevers, other concerns.  Follow-up instructions discussed: Patient encouraged to follow-up with their PCP in 3 days.  Medical Decision Making Amount and/or Complexity of Data Reviewed Labs: ordered.  Risk OTC drugs.   Patient with generalized fatigue, sore throat and pregnancy.  Possible viral pharyngitis.  She looks well.  Lab workup is reassuring.  Normal fetal heart rate and movement on bedside ultrasound.  COVID is negative.  Feels better after IV fluids.  Feel that she can continue supportive care at home with follow-up as needed.  The patient's vital signs, pertinent lab work and imaging were reviewed and interpreted as discussed in the ED course. Hospitalization was considered for further testing, treatments, or serial exams/observation. However as patient is well-appearing, has a stable exam, and reassuring studies today, I do not feel that they warrant admission at this time. This plan was discussed with the patient who verbalizes agreement and comfort with this plan and seems reliable and able to return to the Emergency Department with worsening or changing symptoms.          Final Clinical Impression(s) / ED Diagnoses Final diagnoses:  Dizziness  Sore throat  [redacted] weeks gestation of pregnancy    Rx / DC Orders ED Discharge Orders     None         Renne Crigler, Cordelia Poche 05/07/23 2212    Virgina Norfolk, DO 05/07/23 2300

## 2023-05-07 NOTE — Discharge Instructions (Addendum)
Please read and follow all provided instructions.  Your diagnoses today include:  1. Dizziness   2. Sore throat   3. [redacted] weeks gestation of pregnancy     Tests performed today include: Complete blood cell count Basic metabolic panel  Urinalysis (urine test) COVID test was negative Vital signs. See below for your results today.   Medications prescribed:  None  Take any prescribed medications only as directed.  Home care instructions:  Follow any educational materials contained in this packet.  Follow-up instructions: Please follow-up with your primary care provider in the next 3 days for further evaluation of your symptoms.   Return instructions:  Please return to the Emergency Department if you experience worsening symptoms.  Please return if you have any other emergent concerns.  Additional Information:  Your vital signs today were: BP 134/71 (BP Location: Right Arm)   Pulse 91   Temp 99.2 F (37.3 C) (Oral)   Resp 17   Wt 91.4 kg   LMP 12/29/2022 (Exact Date)   SpO2 99%   BMI 34.59 kg/m  If your blood pressure (BP) was elevated above 135/85 this visit, please have this repeated by your doctor within one month. --------------

## 2023-05-07 NOTE — ED Triage Notes (Signed)
Patient present to ER [redacted] weeks pregnant and states not feeling well.  C/O aches and chills.  Sore throat.

## 2023-05-10 ENCOUNTER — Encounter: Payer: Self-pay | Admitting: Family Medicine

## 2023-05-11 ENCOUNTER — Telehealth: Payer: Self-pay

## 2023-05-11 NOTE — Telephone Encounter (Signed)
Left message for patient to call back and schedule her early glucose test per Dr. Adrian Blackwater.

## 2023-05-12 ENCOUNTER — Ambulatory Visit: Payer: Medicaid Other

## 2023-05-12 ENCOUNTER — Ambulatory Visit: Payer: Medicaid Other | Admitting: *Deleted

## 2023-05-12 ENCOUNTER — Other Ambulatory Visit: Payer: Self-pay | Admitting: Obstetrics & Gynecology

## 2023-05-12 ENCOUNTER — Encounter: Payer: Self-pay | Admitting: *Deleted

## 2023-05-12 VITALS — BP 112/61 | HR 79

## 2023-05-12 DIAGNOSIS — Z8759 Personal history of other complications of pregnancy, childbirth and the puerperium: Secondary | ICD-10-CM

## 2023-05-12 DIAGNOSIS — Z3480 Encounter for supervision of other normal pregnancy, unspecified trimester: Secondary | ICD-10-CM

## 2023-05-12 DIAGNOSIS — O99210 Obesity complicating pregnancy, unspecified trimester: Secondary | ICD-10-CM

## 2023-05-12 LAB — CERVICOVAGINAL ANCILLARY ONLY
Bacterial Vaginitis (gardnerella): NEGATIVE
Candida Glabrata: NEGATIVE
Candida Vaginitis: NEGATIVE
Comment: NEGATIVE
Comment: NEGATIVE
Comment: NEGATIVE

## 2023-05-16 ENCOUNTER — Ambulatory Visit: Payer: Medicaid Other | Attending: Obstetrics & Gynecology

## 2023-05-16 ENCOUNTER — Ambulatory Visit: Payer: Medicaid Other | Admitting: *Deleted

## 2023-05-16 ENCOUNTER — Other Ambulatory Visit: Payer: Self-pay | Admitting: *Deleted

## 2023-05-16 VITALS — BP 121/77 | HR 82

## 2023-05-16 DIAGNOSIS — O99213 Obesity complicating pregnancy, third trimester: Secondary | ICD-10-CM | POA: Insufficient documentation

## 2023-05-16 DIAGNOSIS — E669 Obesity, unspecified: Secondary | ICD-10-CM | POA: Diagnosis not present

## 2023-05-16 DIAGNOSIS — Z3A19 19 weeks gestation of pregnancy: Secondary | ICD-10-CM | POA: Insufficient documentation

## 2023-05-16 DIAGNOSIS — O99212 Obesity complicating pregnancy, second trimester: Secondary | ICD-10-CM

## 2023-05-16 DIAGNOSIS — Z363 Encounter for antenatal screening for malformations: Secondary | ICD-10-CM | POA: Insufficient documentation

## 2023-05-16 DIAGNOSIS — O321XX0 Maternal care for breech presentation, not applicable or unspecified: Secondary | ICD-10-CM | POA: Diagnosis not present

## 2023-05-16 DIAGNOSIS — O09292 Supervision of pregnancy with other poor reproductive or obstetric history, second trimester: Secondary | ICD-10-CM | POA: Insufficient documentation

## 2023-05-16 DIAGNOSIS — Z3480 Encounter for supervision of other normal pregnancy, unspecified trimester: Secondary | ICD-10-CM | POA: Insufficient documentation

## 2023-05-16 DIAGNOSIS — Z362 Encounter for other antenatal screening follow-up: Secondary | ICD-10-CM

## 2023-05-16 DIAGNOSIS — Z8759 Personal history of other complications of pregnancy, childbirth and the puerperium: Secondary | ICD-10-CM | POA: Insufficient documentation

## 2023-05-16 DIAGNOSIS — O99210 Obesity complicating pregnancy, unspecified trimester: Secondary | ICD-10-CM | POA: Insufficient documentation

## 2023-05-17 ENCOUNTER — Ambulatory Visit: Payer: Managed Care, Other (non HMO)

## 2023-05-26 ENCOUNTER — Ambulatory Visit (INDEPENDENT_AMBULATORY_CARE_PROVIDER_SITE_OTHER): Payer: Medicaid Other | Admitting: Family Medicine

## 2023-05-26 VITALS — BP 142/77 | HR 91 | Wt 201.0 lb

## 2023-05-26 DIAGNOSIS — Z8759 Personal history of other complications of pregnancy, childbirth and the puerperium: Secondary | ICD-10-CM

## 2023-05-26 DIAGNOSIS — Z3A21 21 weeks gestation of pregnancy: Secondary | ICD-10-CM | POA: Diagnosis not present

## 2023-05-26 DIAGNOSIS — Z3482 Encounter for supervision of other normal pregnancy, second trimester: Secondary | ICD-10-CM

## 2023-05-26 DIAGNOSIS — R7303 Prediabetes: Secondary | ICD-10-CM

## 2023-05-26 DIAGNOSIS — Z3480 Encounter for supervision of other normal pregnancy, unspecified trimester: Secondary | ICD-10-CM

## 2023-05-26 DIAGNOSIS — R030 Elevated blood-pressure reading, without diagnosis of hypertension: Secondary | ICD-10-CM

## 2023-05-26 NOTE — Progress Notes (Signed)
   PRENATAL VISIT NOTE  Subjective:  Pamela Klein is a 32 y.o. G2P1001 at [redacted]w[redacted]d being seen today for ongoing prenatal care.  She is currently monitored for the following issues for this low-risk pregnancy and has History of gestational hypertension; Supervision of normal pregnancy, antepartum; Obesity in pregnancy, antepartum; and Prediabetes on their problem list.  Patient reports no complaints.  Contractions: Not present. Vag. Bleeding: None.  Movement: Present. Denies leaking of fluid.   The following portions of the patient's history were reviewed and updated as appropriate: allergies, current medications, past family history, past medical history, past social history, past surgical history and problem list.   Objective:   Vitals:   05/26/23 0906 05/26/23 0912  BP: (!) 141/87 (!) 142/77  Pulse: 90 91  Weight: 201 lb (91.2 kg)     Fetal Status: Fetal Heart Rate (bpm): 156   Movement: Present     General:  Alert, oriented and cooperative. Patient is in no acute distress.  Skin: Skin is warm and dry. No rash noted.   Cardiovascular: Normal heart rate noted  Respiratory: Normal respiratory effort, no problems with respiration noted  Abdomen: Soft, gravid, appropriate for gestational age.  Pain/Pressure: Present     Pelvic: Cervical exam deferred        Extremities: Normal range of motion.  Edema: None  Mental Status: Normal mood and affect. Normal behavior. Normal judgment and thought content.   Assessment and Plan:  Pregnancy: G2P1001 at [redacted]w[redacted]d 1. Supervision of other normal pregnancy, antepartum FHT normal  2. [redacted] weeks gestation of pregnancy  3. History of gestational hypertension BP elevated today. Check labs. Will continue to watch  4. Prediabetes Neg early testing  5. Elevated BP without diagnosis of hypertension - Comp Met (CMET)  Preterm labor symptoms and general obstetric precautions including but not limited to vaginal bleeding, contractions, leaking of  fluid and fetal movement were reviewed in detail with the patient. Please refer to After Visit Summary for other counseling recommendations.   No follow-ups on file.  Future Appointments  Date Time Provider Department Center  06/20/2023  7:30 AM [facility] US2 [facility]  06/30/2023  8:15 AM Levie Heritage, DO [facility] None  07/21/2023  8:15 AM Adrian Blackwater Rhona Raider, DO [facility] None    Levie Heritage, DO

## 2023-05-27 LAB — GLUCOSE TOLERANCE, 2 HOURS W/ 1HR
Glucose, 1 hour: 89 mg/dL (ref 70–179)
Glucose, 2 hour: 86 mg/dL (ref 70–152)
Glucose, Fasting: 83 mg/dL (ref 70–91)

## 2023-05-27 LAB — COMPREHENSIVE METABOLIC PANEL
ALT: 9 IU/L (ref 0–32)
AST: 10 IU/L (ref 0–40)
Albumin: 4 g/dL (ref 3.9–4.9)
Alkaline Phosphatase: 43 IU/L — ABNORMAL LOW (ref 44–121)
BUN/Creatinine Ratio: 19 (ref 9–23)
BUN: 8 mg/dL (ref 6–20)
Bilirubin Total: <0.2 mg/dL (ref 0.0–1.2)
CO2: 20 mmol/L (ref 20–29)
Calcium: 9.1 mg/dL (ref 8.7–10.2)
Chloride: 104 mmol/L (ref 96–106)
Creatinine, Ser: 0.43 mg/dL — ABNORMAL LOW (ref 0.57–1.00)
Globulin, Total: 2.1 g/dL (ref 1.5–4.5)
Glucose: 84 mg/dL (ref 70–99)
Potassium: 4.3 mmol/L (ref 3.5–5.2)
Sodium: 140 mmol/L (ref 134–144)
Total Protein: 6.1 g/dL (ref 6.0–8.5)
eGFR: 132 mL/min/{1.73_m2} (ref >59)

## 2023-05-27 LAB — RPR: RPR Ser Ql: NONREACTIVE

## 2023-05-27 LAB — CBC
Hematocrit: 38.2 % (ref 34.0–46.6)
Hemoglobin: 12.1 g/dL (ref 11.1–15.9)
MCH: 28.9 pg (ref 26.6–33.0)
MCHC: 31.7 g/dL (ref 31.5–35.7)
MCV: 91 fL (ref 79–97)
Platelets: 247 10*3/uL (ref 150–450)
RBC: 4.19 x10E6/uL (ref 3.77–5.28)
RDW: 13.1 % (ref 11.7–15.4)
WBC: 8.1 10*3/uL (ref 3.4–10.8)

## 2023-05-27 LAB — HIV ANTIBODY (ROUTINE TESTING W REFLEX): HIV Screen 4th Generation wRfx: NONREACTIVE

## 2023-06-01 ENCOUNTER — Emergency Department (HOSPITAL_BASED_OUTPATIENT_CLINIC_OR_DEPARTMENT_OTHER)
Admission: EM | Admit: 2023-06-01 | Discharge: 2023-06-01 | Disposition: A | Discharge status: Home / Self Care | Payer: Managed Care, Other (non HMO) | Attending: Emergency Medicine | Admitting: Emergency Medicine

## 2023-06-01 ENCOUNTER — Other Ambulatory Visit: Payer: Self-pay

## 2023-06-01 ENCOUNTER — Encounter (HOSPITAL_BASED_OUTPATIENT_CLINIC_OR_DEPARTMENT_OTHER): Payer: Self-pay | Admitting: Emergency Medicine

## 2023-06-01 DIAGNOSIS — Z3A22 22 weeks gestation of pregnancy: Secondary | ICD-10-CM | POA: Diagnosis not present

## 2023-06-01 DIAGNOSIS — U071 COVID-19: Secondary | ICD-10-CM | POA: Insufficient documentation

## 2023-06-01 DIAGNOSIS — O98512 Other viral diseases complicating pregnancy, second trimester: Secondary | ICD-10-CM | POA: Insufficient documentation

## 2023-06-01 DIAGNOSIS — O26892 Other specified pregnancy related conditions, second trimester: Secondary | ICD-10-CM | POA: Diagnosis present

## 2023-06-01 LAB — RESP PANEL BY RT-PCR (RSV, FLU A&B, COVID)  RVPGX2
Influenza A by PCR: NEGATIVE
Influenza B by PCR: NEGATIVE
Resp Syncytial Virus by PCR: NEGATIVE
SARS Coronavirus 2 by RT PCR: POSITIVE — AB

## 2023-06-01 MED ORDER — ACETAMINOPHEN 500 MG PO TABS
500.0000 mg | ORAL_TABLET | Freq: Once | ORAL | Status: AC
  Administered 2023-06-01: 500 mg via ORAL
  Filled 2023-06-01: qty 1

## 2023-06-01 NOTE — ED Triage Notes (Signed)
  Patient comes in with generalized body aches and nasal congestion that started this morning.  Patient states her daughter was sick last week and now she is having similar symptoms.  Patient was concerned because she is [redacted] weeks pregnant.  Patient took Benadryl 50 mg, and 800 mg ibuprofen around 1430.  No fevers at home.  Pain 8/10, throbbing headache.

## 2023-06-01 NOTE — Discharge Instructions (Signed)
Please follow-up with your primary care provider regards to recent symptoms and ER visit.  Today you had a reassuring physical exam however you did test positive for COVID which would explain your symptoms.  Please contact your OB to update them of this.  You may take Tylenol every 6 hours as needed for pain and drink plenty of fluids and eat food as tolerated.  Please avoid ibuprofen.  If symptoms change or worsen please return to ER.

## 2023-06-01 NOTE — ED Provider Notes (Signed)
[place] EMERGENCY DEPARTMENT AT [facility] Provider Note   CSN: [id number] Arrival date & time: 06/01/23  1857     History  Chief Complaint  Patient presents with   Generalized Body Aches   Nasal Congestion    Pamela Klein is a 32 y.o. female who is currently [redacted] weeks pregnant presenting with 6 days of URI-like symptoms.  Patient states her daughter was sick last week with similar symptoms.  Patient states that she has been able to drink without issue.  Patient states that she has had a nonproductive cough.  Patient states that she has generalized bodyaches and some congestion.  Patient has tried Benadryl and ibuprofen to no relief.  Patient denies fevers.  Patient denies chest pain, shortness of breath, new nausea and vomiting, changes sensation/motor skills  Home Medications Prior to Admission medications   Medication Sig Start Date End Date Taking? Authorizing Provider  aspirin EC 81 MG tablet Take 1 tablet (81 mg total) by mouth at bedtime. Please take daily from now until end of pregnancy for prevention of preeclampsia 04/07/23   Anyanwu, Jethro Bastos, MD  ondansetron (ZOFRAN-ODT) 4 MG disintegrating tablet Take 1 tablet (4 mg total) by mouth every 6 (six) hours as needed for nausea. Patient not taking: Reported on 05/12/2023 04/07/23   Anyanwu, Jethro Bastos, MD  Prenatal Vit-Fe Fumarate-FA (MULTIVITAMIN-PRENATAL) 27-0.8 MG TABS tablet Take 1 tablet by mouth daily at 12 noon.    [provider]  scopolamine (TRANSDERM-SCOP) 1 MG/3DAYS Place 1 patch (1.5 mg total) onto the skin every 3 (three) days. Patient not taking: Reported on 05/12/2023 04/07/23   Tereso Newcomer, MD      Allergies    Patient has no known allergies.    Review of Systems   Review of Systems  Physical Exam Updated Vital Signs BP (!) 157/88 (BP Location: Right Arm)   Pulse (!) 109   Temp 98.9 F (37.2 C) (Oral)   Resp 20   Ht 5\' 4"  (1.626 m)   Wt 91.2 kg   LMP 12/29/2022 (Exact Date)    SpO2 100%   BMI 34.50 kg/m  Physical Exam Vitals reviewed.  Constitutional:      General: She is not in acute distress. HENT:     Head: Normocephalic and atraumatic.     Nose: Congestion present.  Eyes:     Extraocular Movements: Extraocular movements intact.     Conjunctiva/sclera: Conjunctivae normal.     Pupils: Pupils are equal, round, and reactive to light.  Cardiovascular:     Rate and Rhythm: Normal rate and regular rhythm.     Pulses: Normal pulses.     Heart sounds: Normal heart sounds.     Comments: 2+ bilateral radial/dorsalis pedis pulses with regular rate Pulmonary:     Effort: Pulmonary effort is normal. No respiratory distress.     Breath sounds: Normal breath sounds.  Abdominal:     Palpations: Abdomen is soft.     Tenderness: There is no abdominal tenderness. There is no guarding or rebound.  Musculoskeletal:        General: Normal range of motion.     Cervical back: Normal range of motion and neck supple.     Comments: 5 out of 5 bilateral grip/leg extension strength  Skin:    General: Skin is warm and dry.     Capillary Refill: Capillary refill takes less than 2 seconds.  Neurological:     General: No focal deficit present.  Mental Status: She is alert and oriented to person, place, and time.     Comments: Sensation intact in all 4 limbs  Psychiatric:        Mood and Affect: Mood normal.     ED Results / Procedures / Treatments   Labs (all labs ordered are listed, but only abnormal results are displayed) Labs Reviewed  RESP PANEL BY RT-PCR (RSV, FLU A&B, COVID)  RVPGX2 - Abnormal; Notable for the following components:      Result Value   SARS Coronavirus 2 by RT PCR POSITIVE (*)    All other components within normal limits    EKG None  Radiology No results found.  Procedures Procedures    Medications Ordered in ED Medications  acetaminophen (TYLENOL) tablet 500 mg (500 mg Oral Given 06/01/23 1925)    ED Course/ Medical Decision  Making/ A&P                                 Medical Decision Making Risk OTC drugs.   Raelyn Mora 32 y.o. presented today for URI like symptoms. Working DDx that I considered at this time includes, but not limited to, viral illness, pharyngitis, mono, sinusitis, electrolyte abnormality.  R/o DDx: pharyngitis, mono, sinusitis, electrolyte abnormality: these diagnoses are not consistent with patient's history, presentation, physical exam, labs/imaging findings.  Review of prior external notes: 05/07/2023 ED  Unique Tests and My Interpretation:  Respiratory Panel: COVID-positive  Discussion with Independent Historian: None  Discussion of Management of Tests: None  Risk: Low: based on diagnostic testing/clinical impression and treatment plan  Risk Stratification Score: None  Plan: On exam patient was in no acute distress with stable vitals.  On exam patient was congested but otherwise was well-appearing.  Patient's respiratory panel is positive for COVID which would explain her symptoms.  I spoke to the patient about she may use Tylenol every 6 hours as needed for pain and drink plenty of fluids and eat food as tolerated.  I spoke to the patient about avoiding ibuprofen as she is pregnant.  I encouraged patient to follow-up with her OB/GYN to update them on the new diagnosis.  Patient has been having symptoms for the past 6 days and is outside the window of Paxlovid.  Patient was given return precautions.patient stable for discharge at this time.  Patient verbalized understanding of plan.         Final Clinical Impression(s) / ED Diagnoses Final diagnoses:  COVID    Rx / DC Orders ED Discharge Orders     None         Remi Deter 06/01/23 2204    Terrilee Files, MD 06/02/23 1029

## 2023-06-02 ENCOUNTER — Telehealth: Payer: Self-pay | Admitting: Family Medicine

## 2023-06-02 NOTE — Telephone Encounter (Signed)
Discussed symptomatic treatment of COVID.  Early 2hr GTT normal

## 2023-06-02 NOTE — Telephone Encounter (Signed)
-----   Message from [facility] Lynn W sent at 06/02/2023  2:59 PM EDT ----- Regarding: FW: Covid This patient recently tested positive for COVID and has questions. ----- Message ----- From: Talbert Cage Sent: 06/02/2023   8:57 AM EDT To: [facility] Clinical Subject: Covid                                          Patient went to the ED and tested postive for covid. They told her to follow up with her OB and she is worried about how covid will effect the baby.   She also stated that she wanted to discuss her glucose test results too.   Best contact number is [phone].

## 2023-06-10 DIAGNOSIS — Z3A14 14 weeks gestation of pregnancy: Secondary | ICD-10-CM | POA: Insufficient documentation

## 2023-06-20 ENCOUNTER — Other Ambulatory Visit: Payer: Self-pay | Admitting: *Deleted

## 2023-06-20 ENCOUNTER — Ambulatory Visit: Payer: 59 | Attending: Obstetrics

## 2023-06-20 DIAGNOSIS — O99212 Obesity complicating pregnancy, second trimester: Secondary | ICD-10-CM | POA: Insufficient documentation

## 2023-06-20 DIAGNOSIS — Z362 Encounter for other antenatal screening follow-up: Secondary | ICD-10-CM | POA: Diagnosis present

## 2023-06-20 DIAGNOSIS — O09292 Supervision of pregnancy with other poor reproductive or obstetric history, second trimester: Secondary | ICD-10-CM

## 2023-06-20 DIAGNOSIS — Z3A24 24 weeks gestation of pregnancy: Secondary | ICD-10-CM | POA: Diagnosis not present

## 2023-06-20 DIAGNOSIS — E669 Obesity, unspecified: Secondary | ICD-10-CM | POA: Diagnosis not present

## 2023-06-20 DIAGNOSIS — O43199 Other malformation of placenta, unspecified trimester: Secondary | ICD-10-CM

## 2023-06-30 ENCOUNTER — Ambulatory Visit (INDEPENDENT_AMBULATORY_CARE_PROVIDER_SITE_OTHER): Payer: Medicaid Other | Admitting: Family Medicine

## 2023-06-30 VITALS — BP 137/87 | HR 89 | Wt 204.0 lb

## 2023-06-30 DIAGNOSIS — O43199 Other malformation of placenta, unspecified trimester: Secondary | ICD-10-CM

## 2023-06-30 DIAGNOSIS — O99210 Obesity complicating pregnancy, unspecified trimester: Secondary | ICD-10-CM

## 2023-06-30 DIAGNOSIS — Z3480 Encounter for supervision of other normal pregnancy, unspecified trimester: Secondary | ICD-10-CM

## 2023-06-30 DIAGNOSIS — Z8759 Personal history of other complications of pregnancy, childbirth and the puerperium: Secondary | ICD-10-CM

## 2023-06-30 DIAGNOSIS — R030 Elevated blood-pressure reading, without diagnosis of hypertension: Secondary | ICD-10-CM

## 2023-06-30 NOTE — Progress Notes (Signed)
   PRENATAL VISIT NOTE  Subjective:  Pamela Klein is a 32 y.o. G2P1001 at [redacted]w[redacted]d being seen today for ongoing prenatal care.  She is currently monitored for the following issues for this low-risk pregnancy and has History of gestational hypertension; Supervision of normal pregnancy, antepartum; Obesity in pregnancy, antepartum; Prediabetes; [redacted] weeks gestation of pregnancy; and Marginal insertion of umbilical cord affecting management of mother on their problem list.  Patient reports occasional contractions.  Contractions: Irritability. Vag. Bleeding: None.  Movement: Present. Denies leaking of fluid.   The following portions of the patient's history were reviewed and updated as appropriate: allergies, current medications, past family history, past medical history, past social history, past surgical history and problem list.   Objective:   Vitals:   06/30/23 0839 06/30/23 0841  BP: (!) 153/81 137/87  Pulse: 92 89  Weight: 204 lb (92.5 kg)     Fetal Status: Fetal Heart Rate (bpm): 143   Movement: Present     General:  Alert, oriented and cooperative. Patient is in no acute distress.  Skin: Skin is warm and dry. No rash noted.   Cardiovascular: Normal heart rate noted  Respiratory: Normal respiratory effort, no problems with respiration noted  Abdomen: Soft, gravid, appropriate for gestational age.  Pain/Pressure: Present     Pelvic: Cervical exam deferred        Extremities: Normal range of motion.  Edema: None  Mental Status: Normal mood and affect. Normal behavior. Normal judgment and thought content.   Assessment and Plan:  Pregnancy: G2P1001 at [redacted]w[redacted]d 1. Supervision of normal pregnancy, antepartum FHT and FH normal Took water birth class, would like to do it.  Discussed that if BP continues to be elevated, then she wouldn't be able to do waterbirth. Will have her meet with CNM.  2. History of gestational hypertension ASA 81mg   3. Obesity in pregnancy, antepartum  4.  Elevated BP without diagnosis of hypertension  5. Marginal insertion of umbilical cord affecting management of mother Serial growth EFW 33%  Preterm labor symptoms and general obstetric precautions including but not limited to vaginal bleeding, contractions, leaking of fluid and fetal movement were reviewed in detail with the patient. Please refer to After Visit Summary for other counseling recommendations.   No follow-ups on file.  Future Appointments  Date Time Provider Department Center  07/21/2023  8:15 AM Levie Heritage, DO [facility] None  08/01/2023  7:30 AM [facility] US2 [facility]  08/08/2023  8:15 AM Lorriane Shire, MD [facility] None  08/25/2023  8:15 AM Levie Heritage, DO [facility] None  09/05/2023  8:15 AM Nobie Putnam, Cyndi Lennert, MD [facility] None    Levie Heritage, DO

## 2023-07-11 ENCOUNTER — Other Ambulatory Visit: Payer: Self-pay

## 2023-07-21 ENCOUNTER — Ambulatory Visit (INDEPENDENT_AMBULATORY_CARE_PROVIDER_SITE_OTHER): Payer: Medicaid Other | Admitting: Family Medicine

## 2023-07-21 VITALS — BP 143/74 | HR 91 | Wt 207.0 lb

## 2023-07-21 DIAGNOSIS — O43199 Other malformation of placenta, unspecified trimester: Secondary | ICD-10-CM

## 2023-07-21 DIAGNOSIS — R7303 Prediabetes: Secondary | ICD-10-CM

## 2023-07-21 DIAGNOSIS — Z3480 Encounter for supervision of other normal pregnancy, unspecified trimester: Secondary | ICD-10-CM

## 2023-07-21 DIAGNOSIS — Z3A29 29 weeks gestation of pregnancy: Secondary | ICD-10-CM

## 2023-07-21 DIAGNOSIS — Z8759 Personal history of other complications of pregnancy, childbirth and the puerperium: Secondary | ICD-10-CM

## 2023-07-21 DIAGNOSIS — O99210 Obesity complicating pregnancy, unspecified trimester: Secondary | ICD-10-CM

## 2023-07-21 NOTE — Progress Notes (Signed)
   PRENATAL VISIT NOTE  Subjective:  Pamela Klein is a 32 y.o. G2P1001 at [redacted]w[redacted]d being seen today for ongoing prenatal care.  She is currently monitored for the following issues for this high-risk pregnancy and has History of gestational hypertension; Supervision of normal pregnancy, antepartum; Obesity in pregnancy, antepartum; Prediabetes; [redacted] weeks gestation of pregnancy; and Marginal insertion of umbilical cord affecting management of mother on their problem list.  Patient reports no complaints.  Contractions: Irritability. Vag. Bleeding: None.  Movement: Present. Denies leaking of fluid.   The following portions of the patient's history were reviewed and updated as appropriate: allergies, current medications, past family history, past medical history, past social history, past surgical history and problem list.   Objective:   Vitals:   07/21/23 0825 07/21/23 0831  BP: (!) 147/83 (!) 143/74  Pulse: (!) 104 91  Weight: 207 lb (93.9 kg)     Fetal Status: Fetal Heart Rate (bpm): 148   Movement: Present     General:  Alert, oriented and cooperative. Patient is in no acute distress.  Skin: Skin is warm and dry. No rash noted.   Cardiovascular: Normal heart rate noted  Respiratory: Normal respiratory effort, no problems with respiration noted  Abdomen: Soft, gravid, appropriate for gestational age.  Pain/Pressure: Present     Pelvic: Cervical exam deferred        Extremities: Normal range of motion.  Edema: None  Mental Status: Normal mood and affect. Normal behavior. Normal judgment and thought content.   Assessment and Plan:  Pregnancy: G2P1001 at [redacted]w[redacted]d 1. [redacted] weeks gestation of pregnancy  2. Supervision of normal pregnancy, antepartum FHT and FH normal Will return for NV for 2hr GTT  3. History of gestational hypertension BP elevated today Check CMP with 2hr GTT  4. Obesity in pregnancy, antepartum  5. Marginal insertion of umbilical cord affecting management of  mother Rpt [place] next   6. Prediabetes Passed early 2hr  Preterm labor symptoms and general obstetric precautions including but not limited to vaginal bleeding, contractions, leaking of fluid and fetal movement were reviewed in detail with the patient. Please refer to After Visit Summary for other counseling recommendations.   No follow-ups on file.  Future Appointments  Date Time Provider Department Center  08/01/2023  7:30 AM [facility] US2 [facility]  08/08/2023  8:15 AM Lorriane Shire, MD [facility] None  08/23/2023  8:15 AM Gerrit Heck, CNM [facility] None  09/05/2023  8:15 AM Celedonio Savage, MD [facility] None  09/12/2023  8:15 AM Anyanwu, Jethro Bastos, MD [facility] None  09/21/2023  8:15 AM Levie Heritage, DO [facility] None  09/29/2023  8:15 AM Levie Heritage, DO [facility] None  10/05/2023  8:15 AM Levie Heritage, DO [facility] None    Levie Heritage, DO

## 2023-07-29 ENCOUNTER — Ambulatory Visit: Payer: 59

## 2023-07-29 DIAGNOSIS — Z3A30 30 weeks gestation of pregnancy: Secondary | ICD-10-CM

## 2023-07-30 LAB — HIV ANTIBODY (ROUTINE TESTING W REFLEX): HIV Screen 4th Generation wRfx: NONREACTIVE

## 2023-07-30 LAB — GLUCOSE TOLERANCE, 2 HOURS W/ 1HR
Glucose, 1 hour: 147 mg/dL (ref 70–179)
Glucose, 2 hour: 122 mg/dL (ref 70–152)
Glucose, Fasting: 92 mg/dL — ABNORMAL HIGH (ref 70–91)

## 2023-07-30 LAB — RPR: RPR Ser Ql: NONREACTIVE

## 2023-07-30 LAB — CBC
Hematocrit: 40.2 % (ref 34.0–46.6)
Hemoglobin: 12.7 g/dL (ref 11.1–15.9)
MCH: 28.6 pg (ref 26.6–33.0)
MCHC: 31.6 g/dL (ref 31.5–35.7)
MCV: 91 fL (ref 79–97)
Platelets: 211 10*3/uL (ref 150–450)
RBC: 4.44 x10E6/uL (ref 3.77–5.28)
RDW: 13.1 % (ref 11.7–15.4)
WBC: 8.4 10*3/uL (ref 3.4–10.8)

## 2023-08-01 ENCOUNTER — Other Ambulatory Visit: Payer: Self-pay

## 2023-08-01 ENCOUNTER — Ambulatory Visit: Payer: 59 | Attending: Maternal & Fetal Medicine

## 2023-08-01 ENCOUNTER — Other Ambulatory Visit: Payer: Self-pay | Admitting: *Deleted

## 2023-08-01 DIAGNOSIS — O99213 Obesity complicating pregnancy, third trimester: Secondary | ICD-10-CM | POA: Diagnosis not present

## 2023-08-01 DIAGNOSIS — O43199 Other malformation of placenta, unspecified trimester: Secondary | ICD-10-CM | POA: Diagnosis present

## 2023-08-01 DIAGNOSIS — O24419 Gestational diabetes mellitus in pregnancy, unspecified control: Secondary | ICD-10-CM | POA: Diagnosis not present

## 2023-08-01 DIAGNOSIS — O43193 Other malformation of placenta, third trimester: Secondary | ICD-10-CM | POA: Diagnosis not present

## 2023-08-01 DIAGNOSIS — O09293 Supervision of pregnancy with other poor reproductive or obstetric history, third trimester: Secondary | ICD-10-CM | POA: Diagnosis not present

## 2023-08-01 DIAGNOSIS — E669 Obesity, unspecified: Secondary | ICD-10-CM

## 2023-08-01 DIAGNOSIS — Z3A30 30 weeks gestation of pregnancy: Secondary | ICD-10-CM

## 2023-08-02 ENCOUNTER — Telehealth (INDEPENDENT_AMBULATORY_CARE_PROVIDER_SITE_OTHER): Payer: 59

## 2023-08-02 ENCOUNTER — Telehealth: Payer: Self-pay

## 2023-08-02 DIAGNOSIS — O24420 Gestational diabetes mellitus in childbirth, diet controlled: Secondary | ICD-10-CM

## 2023-08-02 DIAGNOSIS — Z3A31 31 weeks gestation of pregnancy: Secondary | ICD-10-CM | POA: Diagnosis not present

## 2023-08-02 DIAGNOSIS — Z3480 Encounter for supervision of other normal pregnancy, unspecified trimester: Secondary | ICD-10-CM

## 2023-08-02 DIAGNOSIS — O24419 Gestational diabetes mellitus in pregnancy, unspecified control: Secondary | ICD-10-CM | POA: Insufficient documentation

## 2023-08-02 DIAGNOSIS — O99891 Other specified diseases and conditions complicating pregnancy: Secondary | ICD-10-CM | POA: Diagnosis not present

## 2023-08-02 DIAGNOSIS — R030 Elevated blood-pressure reading, without diagnosis of hypertension: Secondary | ICD-10-CM

## 2023-08-02 NOTE — Telephone Encounter (Signed)
Called patient to inform her that her fasting glucose was abnormal and she has gestational diabetes. Patient made aware that an order for Gestational Diabetes Education has been ordered. Understanding was voiced. Pamela Klein l Aeric Burnham, CMA

## 2023-08-02 NOTE — Telephone Encounter (Addendum)
Pamela Klein 02-22-1991  Patient Location: Home  Patient called, from home, and verified her identity via birth date and name.  Patient with concerns regarding her gestational diabetes diagnosis and candidacy for WB.  Patient informed that diet controlled diabetes that is managed properly is not a contraindication.  However, when reviewing her chart the elevations in her blood pressure is high indicator of future diagnosis of GHTN.  This diagnosis would make remove option of WB for patient.  Patient verbalizes understanding and expresses that she would like option, but ultimately goal is for healthy baby.  Patient informed that she will be meeting with this provider on Dec 17th to further discuss any other questions/concerns she may have.  Patient expresses her gratitude and has no further questions/concerns.  Encouraged to call or send mychart message if any should arise.   Cherre Robins MSN, CNM Advanced Practice Provider, Center for [facility]  Provider Location: [facility]  **This visit was completed, in its entirety, via telehealth communications by telephone.  I personally spent >/=5 minutes on the phone providing recommendations, education, and guidance.**

## 2023-08-02 NOTE — Telephone Encounter (Signed)
-----   Message from Lorriane Shire sent at 08/01/2023 11:54 AM EST ----- Diabetes educator, supplies and referral for gdm

## 2023-08-05 ENCOUNTER — Encounter (HOSPITAL_COMMUNITY): Payer: Self-pay

## 2023-08-08 ENCOUNTER — Ambulatory Visit (INDEPENDENT_AMBULATORY_CARE_PROVIDER_SITE_OTHER): Payer: Medicaid Other | Admitting: Obstetrics and Gynecology

## 2023-08-08 VITALS — BP 124/80 | HR 96 | Wt 213.0 lb

## 2023-08-08 DIAGNOSIS — G8929 Other chronic pain: Secondary | ICD-10-CM

## 2023-08-08 DIAGNOSIS — M545 Low back pain, unspecified: Secondary | ICD-10-CM

## 2023-08-08 DIAGNOSIS — O43199 Other malformation of placenta, unspecified trimester: Secondary | ICD-10-CM

## 2023-08-08 DIAGNOSIS — Z3480 Encounter for supervision of other normal pregnancy, unspecified trimester: Secondary | ICD-10-CM

## 2023-08-08 DIAGNOSIS — Z3A31 31 weeks gestation of pregnancy: Secondary | ICD-10-CM

## 2023-08-08 DIAGNOSIS — Z8759 Personal history of other complications of pregnancy, childbirth and the puerperium: Secondary | ICD-10-CM

## 2023-08-08 DIAGNOSIS — O24420 Gestational diabetes mellitus in childbirth, diet controlled: Secondary | ICD-10-CM
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
## 2023-08-15 ENCOUNTER — Other Ambulatory Visit: Payer: Self-pay

## 2023-08-15 DIAGNOSIS — Z3A32 32 weeks gestation of pregnancy: Secondary | ICD-10-CM

## 2023-08-15 DIAGNOSIS — O24419 Gestational diabetes mellitus in pregnancy, unspecified control: Secondary | ICD-10-CM

## 2023-08-15 MED ORDER — ACCU-CHEK GUIDE TEST VI STRP: ORAL_STRIP | 12 refills | Status: DC

## 2023-08-15 MED ORDER — ACCU-CHEK GUIDE W/DEVICE KIT: PACK | 0 refills | Status: DC

## 2023-08-15 MED ORDER — ACCU-CHEK SOFTCLIX LANCETS MISC: 100.0000 | Freq: Four times a day (QID) | 12 refills | Status: DC

## 2023-08-16 ENCOUNTER — Telehealth: Payer: Self-pay

## 2023-08-16 NOTE — Telephone Encounter (Signed)
Called patient's pharmacy to check the status of her testing supplies. Pharmacy assistant states patient picked up supplies this morning.  Pamela Klein l Caileigh Canche, CMA

## 2023-08-16 NOTE — Telephone Encounter (Signed)
-----   Message from Neale Burly sent at 08/16/2023  3:55 PM EST ----- Regarding: Case Management Annice Pih a Case Manager from [place] called stating that the patient said she is having a problem getting her diabetes equipment. She said that she can assist [place] with get that equipment to the patient if it would be helpful.  Her number is [phone] ext. [phone]

## 2023-08-17 ENCOUNTER — Encounter: Payer: Self-pay | Admitting: Obstetrics & Gynecology

## 2023-08-18 ENCOUNTER — Other Ambulatory Visit: Payer: Self-pay

## 2023-08-18 DIAGNOSIS — O24419 Gestational diabetes mellitus in pregnancy, unspecified control: Secondary | ICD-10-CM

## 2023-08-19 ENCOUNTER — Encounter: Payer: Self-pay | Admitting: Family Medicine

## 2023-08-23 ENCOUNTER — Ambulatory Visit (INDEPENDENT_AMBULATORY_CARE_PROVIDER_SITE_OTHER): Payer: Medicaid Other

## 2023-08-23 VITALS — BP 127/79 | HR 92 | Wt 214.0 lb

## 2023-08-23 DIAGNOSIS — Z3490 Encounter for supervision of normal pregnancy, unspecified, unspecified trimester: Secondary | ICD-10-CM

## 2023-08-23 DIAGNOSIS — Z8759 Personal history of other complications of pregnancy, childbirth and the puerperium: Secondary | ICD-10-CM

## 2023-08-23 DIAGNOSIS — Z3480 Encounter for supervision of other normal pregnancy, unspecified trimester: Secondary | ICD-10-CM

## 2023-08-23 DIAGNOSIS — O24419 Gestational diabetes mellitus in pregnancy, unspecified control: Secondary | ICD-10-CM

## 2023-08-23 DIAGNOSIS — N898 Other specified noninflammatory disorders of vagina: Secondary | ICD-10-CM

## 2023-08-23 DIAGNOSIS — Z3A33 33 weeks gestation of pregnancy: Secondary | ICD-10-CM

## 2023-08-23 NOTE — Progress Notes (Addendum)
HIGH-RISK PREGNANCY OFFICE VISIT  Patient name: Pamela Klein MRN [id number]  Date of birth: 05-Apr-1991 Chief Complaint:   Routine Prenatal Visit  Subjective:   Pamela Klein is a 32 y.o. G62P1001 female at [redacted]w[redacted]d with an Estimated Date of Delivery: 10/05/23 being seen today for ongoing management of a high-risk pregnancy aeb has History of gestational hypertension; Supervision of normal pregnancy, antepartum; Obesity in pregnancy, antepartum; Prediabetes; Marginal insertion of umbilical cord affecting management of mother; and Gestational diabetes mellitus (GDM) in childbirth, diet controlled on their problem list.  Patient presents today, alone, with  vaginal bump .   Patient states she noted a vaginal bump that has been present for the past 2 weeks.  She states the bump is sensitive to touch, but no pain or discomfort with urination.  She reports it was bigger, but is getting smaller.  Patient denies h/o HSV and is not currently SA.    Patient endorses fetal movement. Patient reports abdominal cramping or contractions.  Patient denies vaginal concerns including abnormal discharge, leaking of fluid, and bleeding. No issues with urination, constipation, or diarrhea.    Contractions: Irritability. Vag. Bleeding: None.  Movement: Present.  Reviewed past medical,surgical, social, obstetrical and family history as well as problem list, medications and allergies.  Objective   Vitals:   08/23/23 0816  BP: 127/79  Pulse: 92  Weight: 214 lb (97.1 kg)  Body mass index is 36.73 kg/m.  Total Weight Gain:19 lb (8.618 kg)         Physical Examination:   General appearance: Well appearing, and in no distress  Mental status: Alert, oriented to person, place, and time  Skin: Warm & dry  Cardiovascular: Normal heart rate noted  Respiratory: Normal respiratory effort, no distress  Abdomen: Soft, gravid, nontender, LGA with Fundal Height: 38 cm  Pelvic:  Visual exam performed.  Small lesion  noted on anterior aspect of left labia. Sensitive to touch. Hair noted and removed with open lesion appreciated. No active discharge, no apparent redness. HSV culture collected             Extremities: Edema: Trace  Fetal Status: Fetal Heart Rate (bpm): 134  Movement: Present   No results found for this or any previous visit (from the past 24 hours).  Assessment & Plan:  High-risk pregnancy of a 32 y.o., G2P1001 at [redacted]w[redacted]d with an Estimated Date of Delivery: 10/05/23   1. Supervision of normal pregnancy, antepartum -Anticipatory guidance for upcoming appts. -Patient to schedule next appt in 2-3 weeks for an in-person visit. -Reviewed next appt and cultures to be collected.  -Discussed option of cervical exam, at visit, if desired.    2. [redacted] weeks gestation of pregnancy -concerns addressed. -Reassured that some BH contractions, cramping, and pelvic pressure is to be expected at this GA. Encouraged to monitor.   3. History of gestational hypertension -BP normotensive today.   4. Gestational diabetes mellitus (GDM), antepartum, gestational diabetes method of control unspecified -Not checking blood sugars. -Unable to obtain random d/t office glucometer malfunction. -Reports scheduled for class tomorrow.   5. Vaginal lesion -Informed that area is of concern, but not definitive for HSV. -HSV culture collected. -Discussed waiting for results vs starting Valtrex now.  Patient opts to wait for results.   6. Fundal height high for dates -FH at 38cm -Considering unmonitored diabetes, concern for LGA infant -Scheduled for growth [place] on 09/01/2023     Meds: No orders of the defined types were placed in this encounter.  Labs/procedures today:  Lab Orders  No laboratory test(s) ordered today     Reviewed: Preterm labor symptoms and general obstetric precautions including but not limited to vaginal bleeding, contractions, leaking of fluid and fetal movement were reviewed in detail with the  patient.  All questions were answered.  Follow-up: Return in about 3 weeks (around 09/13/2023) for LROB.  No orders of the defined types were placed in this encounter.  Cherre Robins MSN, CNM 08/23/2023

## 2023-08-24 ENCOUNTER — Ambulatory Visit: Payer: 59

## 2023-08-25 ENCOUNTER — Encounter: Payer: Medicaid Other | Admitting: Family Medicine

## 2023-08-26 LAB — HERPES SIMPLEX VIRUS CULTURE

## 2023-08-29 ENCOUNTER — Telehealth: Payer: Self-pay

## 2023-08-29 NOTE — Telephone Encounter (Signed)
Error

## 2023-09-01 ENCOUNTER — Ambulatory Visit: Payer: 59 | Admitting: *Deleted

## 2023-09-01 ENCOUNTER — Ambulatory Visit: Payer: 59 | Attending: Obstetrics and Gynecology

## 2023-09-01 ENCOUNTER — Encounter: Payer: Self-pay | Admitting: *Deleted

## 2023-09-01 ENCOUNTER — Other Ambulatory Visit: Payer: Self-pay | Admitting: *Deleted

## 2023-09-01 VITALS — BP 138/75 | HR 86

## 2023-09-01 DIAGNOSIS — O99213 Obesity complicating pregnancy, third trimester: Secondary | ICD-10-CM | POA: Diagnosis not present

## 2023-09-01 DIAGNOSIS — O24410 Gestational diabetes mellitus in pregnancy, diet controlled: Secondary | ICD-10-CM | POA: Diagnosis not present

## 2023-09-01 DIAGNOSIS — O09293 Supervision of pregnancy with other poor reproductive or obstetric history, third trimester: Secondary | ICD-10-CM

## 2023-09-01 DIAGNOSIS — O24419 Gestational diabetes mellitus in pregnancy, unspecified control: Secondary | ICD-10-CM | POA: Diagnosis present

## 2023-09-01 DIAGNOSIS — Z3A35 35 weeks gestation of pregnancy: Secondary | ICD-10-CM

## 2023-09-01 DIAGNOSIS — E669 Obesity, unspecified: Secondary | ICD-10-CM | POA: Diagnosis not present

## 2023-09-01 DIAGNOSIS — O24420 Gestational diabetes mellitus in childbirth, diet controlled: Secondary | ICD-10-CM | POA: Insufficient documentation

## 2023-09-01 NOTE — Telephone Encounter (Signed)
Called patient to check to see if she is able to use her glucometer. Patient states her pharmacist showed her how to use it. Patient states she was upset because she was told she was not a candidate for water birth no explanation was given. Advised patient to talk to the provider at her next about and she will explain to her why she may not be a candidate for water birth. Understanding was voiced. Neosha Switalski l Alaja Goldinger, CMA

## 2023-09-05 ENCOUNTER — Other Ambulatory Visit (HOSPITAL_COMMUNITY)
Admission: RE | Admit: 2023-09-05 | Discharge: 2023-09-05 | Disposition: A | Discharge status: Home / Self Care | Payer: 59 | Source: Ambulatory Visit | Attending: Family Medicine | Admitting: Family Medicine

## 2023-09-05 ENCOUNTER — Ambulatory Visit (INDEPENDENT_AMBULATORY_CARE_PROVIDER_SITE_OTHER): Payer: 59 | Admitting: Family Medicine

## 2023-09-05 VITALS — BP 134/77 | HR 89 | Wt 216.0 lb

## 2023-09-05 DIAGNOSIS — Z8759 Personal history of other complications of pregnancy, childbirth and the puerperium: Secondary | ICD-10-CM

## 2023-09-05 DIAGNOSIS — Z3480 Encounter for supervision of other normal pregnancy, unspecified trimester: Secondary | ICD-10-CM

## 2023-09-05 DIAGNOSIS — Z3483 Encounter for supervision of other normal pregnancy, third trimester: Secondary | ICD-10-CM | POA: Diagnosis not present

## 2023-09-05 DIAGNOSIS — Z3A36 36 weeks gestation of pregnancy: Secondary | ICD-10-CM | POA: Insufficient documentation

## 2023-09-05 DIAGNOSIS — Z3490 Encounter for supervision of normal pregnancy, unspecified, unspecified trimester: Secondary | ICD-10-CM

## 2023-09-05 DIAGNOSIS — Z23 Encounter for immunization: Secondary | ICD-10-CM

## 2023-09-05 DIAGNOSIS — Z3A35 35 weeks gestation of pregnancy: Secondary | ICD-10-CM | POA: Diagnosis not present

## 2023-09-05 DIAGNOSIS — O24419 Gestational diabetes mellitus in pregnancy, unspecified control: Secondary | ICD-10-CM

## 2023-09-05 LAB — GC/CHLAMYDIA PROBE AMP (~~LOC~~) NOT AT ARMC
Chlamydia: NEGATIVE
Comment: NEGATIVE
Comment: NORMAL
Neisseria Gonorrhea: NEGATIVE

## 2023-09-05 NOTE — Progress Notes (Signed)
   PRENATAL VISIT NOTE  Subjective:  Pamela Klein is a 32 y.o. G2P1001 at [redacted]w[redacted]d being seen today for ongoing prenatal care.  She is currently monitored for the following issues for this high-risk pregnancy and has History of gestational hypertension; Supervision of normal pregnancy, antepartum; Obesity in pregnancy, antepartum; Prediabetes; Marginal insertion of umbilical cord affecting management of mother; and Gestational diabetes mellitus (GDM) in childbirth, diet controlled on their problem list.  Patient reports no bleeding, no cramping, no leaking, and occasional contractions.  Contractions: Irritability. Vag. Bleeding: None.  Movement: Present. Denies leaking of fluid.   The following portions of the patient's history were reviewed and updated as appropriate: allergies, current medications, past family history, past medical history, past social history, past surgical history and problem list.   Objective:   Vitals:   09/05/23 0825  BP: 134/77  Pulse: 89  Weight: 216 lb (98 kg)    Fetal Status: Fetal Heart Rate (bpm): 142   Movement: Present     General:  Alert, oriented and cooperative. Patient is in no acute distress.  Skin: Skin is warm and dry. No rash noted.   Cardiovascular: Normal heart rate noted  Respiratory: Normal respiratory effort, no problems with respiration noted  Abdomen: Soft, gravid, appropriate for gestational age.  Pain/Pressure: Present     Pelvic: Cervical exam performed in the presence of a chaperone      1/thick/-2  Extremities: Normal range of motion.  Edema: Trace  Mental Status: Normal mood and affect. Normal behavior. Normal judgment and thought content.   Assessment and Plan:  Pregnancy: G2P1001 at [redacted]w[redacted]d 1. Supervision of normal pregnancy, antepartum (Primary) Continue routine prenatal care - GC/Chlamydia probe amp ([place])not at [facility] - Culture, beta strep (group b only) - Respiratory syncytial virus vaccine, preF, subunit,  bivalent,(Abrysvo)  2. History of gestational hypertension Blood pressure within normal limits today  3. Gestational diabetes mellitus (GDM), antepartum, gestational diabetes method of control unspecified Patient has glucometer with her.  Has 9 blood sugar readings since the last visit.  4 of those 9 are mildly elevated.  Discussed the importance of taking it 4 times a day.  Provided blood glucose log.  She reports that she will do her best to take it 4 times a day and bring the log to the next visit. -Continue glucose monitoring - Discussed with patient induction around 39 weeks if not in labor before then - Discussed monitoring fetal movement  4. Fundal height high for dates Growth ultrasound on 12/26 showing EFW 83rd percentile  5. [redacted] weeks gestation of pregnancy - Respiratory syncytial virus vaccine, preF, subunit, bivalent,(Abrysvo)  Preterm labor symptoms and general obstetric precautions including but not limited to vaginal bleeding, contractions, leaking of fluid and fetal movement were reviewed in detail with the patient. Please refer to After Visit Summary for other counseling recommendations.   No follow-ups on file.  Future Appointments  Date Time Provider Department Center  09/12/2023  8:15 AM Anyanwu, Jethro Bastos, MD [facility] None  09/21/2023  8:15 AM Levie Heritage, DO [facility] None  09/29/2023  8:15 AM Levie Heritage, DO [facility] None  09/30/2023  8:15 AM [facility] NURSE [facility]  09/30/2023  8:30 AM [facility] US2 [facility]  10/05/2023  8:15 AM Stinson, Rhona Raider, DO [facility] None    Celedonio Savage, MD   .Lorin Picket

## 2023-09-06 ENCOUNTER — Other Ambulatory Visit: Payer: Self-pay

## 2023-09-06 ENCOUNTER — Telehealth: Payer: Self-pay

## 2023-09-06 DIAGNOSIS — O219 Vomiting of pregnancy, unspecified: Secondary | ICD-10-CM

## 2023-09-06 MED ORDER — ONDANSETRON HCL 4 MG PO TABS: 4.0000 mg | ORAL_TABLET | Freq: Three times a day (TID) | ORAL | 0 refills | Status: DC | PRN

## 2023-09-06 NOTE — Telephone Encounter (Signed)
 Patient called stating she was given a RSV and TDAP vaccine yesterday and she is feeling sick. She states she is having a headache and nausea and vomting. Per Dr. Eldonna, patient could be having an immune response to one of the vaccines or she could have come in contact with a virus over the holidays. Advised patient to continue to take tylenol , drink fluids, and rest. A Rx for Zofran  was sent to patient's pharmacy. Understanding was voiced. Lynne Takemoto l Vincie Linn, CMA

## 2023-09-07 NOTE — L&D Delivery Note (Signed)
OB/GYN Faculty Practice Delivery Note  Pamela Klein is a 33 y.o. G2P1001 s/p SVD at [redacted]w[redacted]d. She was admitted for IOL for A1GDM.   ROM: 34h 47m with clear fluid that became meconium stained GBS Status: Negative Maximum Maternal Temperature: 98.9  Labor Progress: OP FB placed, then IOL started with AROM and Pitocin titration. Progressed slowly to 5cm, then slower to 6.5cm then called to room for late decelerations and pt found to be 9.5cm and +3.   Delivery Date/Time: 10/01/23 at 1227 Delivery: Called to room for late decels. Pt 9.5cm but able to push past cervix as she was already +3 to +4. Pushed through one contraction and head delivered LOA. No nuchal cord present. Shoulder and body delivered in usual fashion. Infant without spontaneous cry, placed on mother's abdomen, dried and stimulated, bulb syringe used for suction by RN and baby remained without respiratory effort. Cord clamped x 2 after 1-minute, and cut by FOB. Baby passed to RN and NICU code called. Placenta delivered spontaneously, intact, with 3-vessel cord. Fundus firm with massage and Pitocin. Labia, perineum, vagina, and cervix inspected, 1st degree laceration found and repaired with 3.0 monocryl.   Placenta: spontaneous, sent to pathology Complications: prolonged active phase with rapid descent, baby sent to NICU Lacerations: 1st degree EBL: 123 Analgesia: epidural  Postpartum Planning [x]  transfer orders to MB [x]  discharge summary started & shared [x]  message to sent to schedule follow-up  [x]  lists updated  Infant: Boy  APGARs 4 at one minute  3470g  Edd Arbour, CNM, IBCLC Certified Nurse Midwife, [employer] for [employer], [facility] 10/01/2023, 1:03 PM

## 2023-09-08 ENCOUNTER — Encounter (HOSPITAL_COMMUNITY): Payer: Self-pay | Admitting: Family Medicine

## 2023-09-08 ENCOUNTER — Inpatient Hospital Stay (HOSPITAL_COMMUNITY)
Admission: AD | Admit: 2023-09-08 | Discharge: 2023-09-08 | Disposition: A | Discharge status: Home / Self Care | Payer: 59 | Attending: Family Medicine | Admitting: Family Medicine

## 2023-09-08 ENCOUNTER — Other Ambulatory Visit: Payer: Self-pay

## 2023-09-08 DIAGNOSIS — Z3A36 36 weeks gestation of pregnancy: Secondary | ICD-10-CM | POA: Insufficient documentation

## 2023-09-08 DIAGNOSIS — O4703 False labor before 37 completed weeks of gestation, third trimester: Secondary | ICD-10-CM | POA: Diagnosis present

## 2023-09-08 DIAGNOSIS — O212 Late vomiting of pregnancy: Secondary | ICD-10-CM | POA: Insufficient documentation

## 2023-09-08 DIAGNOSIS — K529 Noninfective gastroenteritis and colitis, unspecified: Secondary | ICD-10-CM

## 2023-09-08 DIAGNOSIS — A059 Bacterial foodborne intoxication, unspecified: Secondary | ICD-10-CM

## 2023-09-08 DIAGNOSIS — O26893 Other specified pregnancy related conditions, third trimester: Secondary | ICD-10-CM

## 2023-09-08 LAB — COMPREHENSIVE METABOLIC PANEL
ALT: 16 U/L (ref 0–44)
AST: 14 U/L — ABNORMAL LOW (ref 15–41)
Albumin: 2.7 g/dL — ABNORMAL LOW (ref 3.5–5.0)
Alkaline Phosphatase: 92 U/L (ref 38–126)
Anion gap: 8 (ref 5–15)
BUN: <5 mg/dL — ABNORMAL LOW (ref 6–20)
CO2: 23 mmol/L (ref 22–32)
Calcium: 8.7 mg/dL — ABNORMAL LOW (ref 8.9–10.3)
Chloride: 105 mmol/L (ref 98–111)
Creatinine, Ser: 0.46 mg/dL (ref 0.44–1.00)
GFR, Estimated: >60 mL/min (ref >60)
Glucose, Bld: 98 mg/dL (ref 70–99)
Potassium: 4 mmol/L (ref 3.5–5.1)
Sodium: 136 mmol/L (ref 135–145)
Total Bilirubin: 0.3 mg/dL (ref 0.0–1.2)
Total Protein: 5.9 g/dL — ABNORMAL LOW (ref 6.5–8.1)

## 2023-09-08 MED ORDER — SODIUM CHLORIDE 0.9 % IV SOLN
25.0000 mg | Freq: Once | INTRAVENOUS | Status: AC
  Administered 2023-09-08: 25 mg via INTRAVENOUS
  Filled 2023-09-08: qty 1

## 2023-09-08 MED ORDER — ACETAMINOPHEN 500 MG PO TABS
1000.0000 mg | ORAL_TABLET | Freq: Once | ORAL | Status: AC
  Administered 2023-09-08: 1000 mg via ORAL
  Filled 2023-09-08: qty 2

## 2023-09-08 MED ORDER — LACTATED RINGERS IV SOLN: INTRAVENOUS | Status: DC

## 2023-09-08 MED ORDER — CYCLOBENZAPRINE HCL 5 MG PO TABS
10.0000 mg | ORAL_TABLET | Freq: Three times a day (TID) | ORAL | Status: DC | PRN
  Administered 2023-09-08: 10 mg via ORAL
  Filled 2023-09-08: qty 2

## 2023-09-08 NOTE — MAU Note (Signed)
.  Pamela Klein is a 33 y.o. at [redacted]w[redacted]d here in MAU reporting: having contractions that started last night after dinner.  Contraction coming every 15 minutes. States that pain is in lower abdomen. Had an episode of nausea and vomiting around 2300. Has vomited 3 times since last night. No diarrhea. Reports also having a fall early while trying to get up to go vomit early this morning. Reports falling on her bottom. Denies LOF and VB. +FM. Reports receiving TDAP and RSV vaccine in office on Monday.   Onset of complaint: Last night Pain score: 10/10 lower abdomen, 8/10 lower back There were no vitals filed for this visit.   FHT:150 Lab orders placed from triage: urinae obtained in triage.

## 2023-09-08 NOTE — MAU Provider Note (Addendum)
 Chief Complaint:  Contractions   None    HPI: Pamela Klein is a 33 y.o. G2P1001 at [redacted]w[redacted]d who presents to maternity admissions reporting contractions every 15 minutes since going out to eat last night at 1900. Reports she ate grilled parmesan chicken, shrimp, mashed potatoes and broccoli.   Took zofran  at 2300 without relief. Reports emesis x4.  Denies contractions, leakage of fluid or vaginal bleeding. Good fetal movement.  Location: lower abdomen Quality: sharp and cramping pain Severity: 10/10 in pain scale Duration: comes and goes/ colicky since 1900 Modifying factors: nothing makes it better or worse Associated signs and symptoms: nausea and vomitng   Pregnancy Course: gestational diabetes  Past Medical History:  Diagnosis Date   Cannabis abuse 05/01/2013   Chlamydia    Chronic back pain    Community acquired pneumonia 05/01/2013   H/O varicella    Sepsis (HCC) 05/01/2013   Vitamin D deficiency 11/28/2017   OB History  Gravida Para Term Preterm AB Living  2 1 1  0 0 1  SAB IAB Ectopic Multiple Live Births  0 0 0 0 1    # Outcome Date GA Lbr Len/2nd Weight Sex Type Anes PTL Lv  2 Current           1 Term 02/27/12 [redacted]w[redacted]d   F Vag-Spont Local  LIV   Past Surgical History:  Procedure Laterality Date   NO PAST SURGERIES     Family History  Problem Relation Age of Onset   Hypertension Father    Diabetes Paternal Grandmother    Social History   Tobacco Use   Smoking status: Former    Current packs/day: 0.00    Types: Cigarettes    Start date: 04/05/2008    Quit date: 04/06/2011    Years since quitting: 12.4   Smokeless tobacco: Never   Tobacco comments:    05/01/2013 smoked Black N Milds  Vaping Use   Vaping status: Never Used  Substance Use Topics   Alcohol use: Yes    Alcohol/week: 0.0 standard drinks of alcohol    Comment: 05/01/2013 none recently -occasional   Drug use: No   No Known Allergies Medications Prior to Admission  Medication Sig  Dispense Refill Last Dose/Taking   Accu-Chek Softclix Lancets lancets 100 each by Other route 4 (four) times daily. DX: N75:580. Check blood sugar four times a day. 100 each 12 09/08/2023 Morning   aspirin  EC 81 MG tablet Take 1 tablet (81 mg total) by mouth at bedtime. Please take daily from now until end of pregnancy for prevention of preeclampsia 300 tablet 2 09/07/2023 Morning   Blood Glucose Monitoring Suppl (ACCU-CHEK GUIDE) w/Device KIT DX: O24:419. Check blood sugar four times a day. 1 kit 0 09/08/2023 Morning   glucose blood (ACCU-CHEK GUIDE TEST) test strip DX: O24:419. Check blood sugar four times a day. 100 each 12 09/08/2023 Morning   ondansetron  (ZOFRAN ) 4 MG tablet Take 1 tablet (4 mg total) by mouth every 8 (eight) hours as needed for nausea or vomiting. 20 tablet 0 09/08/2023 at 12:00 AM   ondansetron  (ZOFRAN -ODT) 4 MG disintegrating tablet Take 1 tablet (4 mg total) by mouth every 6 (six) hours as needed for nausea. 20 tablet 1 09/08/2023 Morning   Prenatal Vit-Fe Fumarate-FA (MULTIVITAMIN-PRENATAL) 27-0.8 MG TABS tablet Take 1 tablet by mouth daily at 12 noon.   09/07/2023   scopolamine  (TRANSDERM-SCOP) 1 MG/3DAYS Place 1 patch (1.5 mg total) onto the skin every 3 (three) days. (Patient not taking: Reported  on 05/12/2023) 10 patch 12 More than a month    I have reviewed patient's Past Medical Hx, Surgical Hx, Family Hx, Social Hx, medications and allergies.   ROS:  Review of Systems  Constitutional:  Negative for chills, fatigue, fever and unexpected weight change.  Respiratory:  Negative for cough and shortness of breath.   Cardiovascular:  Negative for chest pain and palpitations.  Gastrointestinal:  Positive for abdominal pain, nausea and vomiting. Negative for diarrhea.  Genitourinary:  Negative for difficulty urinating, flank pain, frequency and urgency.    Physical Exam  Patient Vitals for the past 24 hrs:  BP Temp Temp src Pulse Resp SpO2 Height  09/08/23 0730 125/64 98 F (36.7  C) Oral 70 18 100 % 5' 4 (1.626 m)    Constitutional: Well-developed, well-nourished female in no acute distress.  Cardiovascular: normal rate & rhythm, no murmur Respiratory: normal effort, lung sounds clear throughout GI: Abd soft, non-tender, gravid appropriate for gestational age. Pos BS x 4 MS: Extremities nontender, no edema, normal ROM Neurologic: Alert and oriented x 4.  GU:      Pelvic: NEFG, physiologic discharge, no blood, cervix clean.   Dilation: Closed (external flare) Effacement (%): Thick Station: -3 Exam by:: Nat Sharps RN  NST - FHR: 140 bpm / moderate variability / accels present / decels absent / TOCO: regular every 10-15 mins    Labs: No results found for this or any previous visit (from the past 24 hours).  Imaging:  No results found.  MAU Course: Orders Placed This Encounter  Procedures   Comprehensive metabolic panel   Insert peripheral IV   Meds ordered this encounter  Medications   promethazine  (PHENERGAN ) 25 mg in sodium chloride  0.9 % 50 mL IVPB   lactated ringers  infusion   acetaminophen  (TYLENOL ) tablet 1,000 mg   cyclobenzaprine  (FLEXERIL ) tablet 10 mg    MDM: Medium  Plan:  Care turned over to Dr. Rashan Rounsaville @ 0811   Regino Camie LABOR, CNM 09/08/2023 8:10 AM   Re-assessment: [redacted] weeks gestation Suspect food poisoning vs gastroenteritis given acute onset after eating out  Nausea improved with fluids and phenergan . Tolerating PO. CMP reassuring.  Pain improved with Tylenol  and Flexeril .   Plan: Discharge home Preterm labor, return and kick count precautions provided Continue routine prenatal care as scheduled  Loyola Fish, MD 09/08/2023 9:19 AM

## 2023-09-09 LAB — CULTURE, BETA STREP (GROUP B ONLY): Strep Gp B Culture: NEGATIVE

## 2023-09-12 ENCOUNTER — Ambulatory Visit (INDEPENDENT_AMBULATORY_CARE_PROVIDER_SITE_OTHER): Payer: 59 | Admitting: Obstetrics & Gynecology

## 2023-09-12 VITALS — BP 137/86 | HR 85 | Wt 216.0 lb

## 2023-09-12 DIAGNOSIS — O43199 Other malformation of placenta, unspecified trimester: Secondary | ICD-10-CM

## 2023-09-12 DIAGNOSIS — Z3A36 36 weeks gestation of pregnancy: Secondary | ICD-10-CM | POA: Diagnosis not present

## 2023-09-12 DIAGNOSIS — O24410 Gestational diabetes mellitus in pregnancy, diet controlled: Secondary | ICD-10-CM | POA: Diagnosis not present

## 2023-09-12 DIAGNOSIS — O0993 Supervision of high risk pregnancy, unspecified, third trimester: Secondary | ICD-10-CM

## 2023-09-12 NOTE — Progress Notes (Signed)
   PRENATAL VISIT NOTE  Subjective:  Pamela Klein is a 33 y.o. G2P1001 at [redacted]w[redacted]d being seen today for ongoing prenatal care.  She is currently monitored for the following issues for this high-risk pregnancy and has History of gestational hypertension; Supervision of high-risk pregnancy, third trimester; Obesity in pregnancy, antepartum; Prediabetes; Marginal insertion of umbilical cord affecting management of mother; and Gestational diabetes mellitus (GDM) in third trimester on their problem list.  Patient reports no complaints.  Contractions: Irritability. Vag. Bleeding: None.  Movement: Present. Denies leaking of fluid.   The following portions of the patient's history were reviewed and updated as appropriate: allergies, current medications, past family history, past medical history, past social history, past surgical history and problem list.   Objective:   Vitals:   09/12/23 0902  BP: 137/86  Pulse: 85  Weight: 216 lb (98 kg)    Fetal Status:     Movement: Present     General:  Alert, oriented and cooperative. Patient is in no acute distress.  Skin: Skin is warm and dry. No rash noted.   Cardiovascular: Normal heart rate noted  Respiratory: Normal respiratory effort, no problems with respiration noted  Abdomen: Soft, gravid, appropriate for gestational age.  Pain/Pressure: Present     Pelvic: Cervical exam deferred        Extremities: Normal range of motion.  Edema: Trace  Mental Status: Normal mood and affect. Normal behavior. Normal judgment and thought content.   Assessment and Plan:  Pregnancy: G2P1001 at [redacted]w[redacted]d 1. Diet controlled gestational diabetes mellitus (GDM) in third trimester (Primary) Reviewed CBGs, had many fasting values in upper 90s, low 100s.  Discussed that this was indication for starting medication or earlier delivery but patient reports having coffee with sugar/cream before these values.  She was told to make sure the values are true fasting values, and  to increase activity after dinner.  PP values are within range. Discussed IOL, she does not like this idea as she wants spontaneous labor, but agreed to have it scheduled on 09/30/23 at midnight, orders signed and held.  Understands that if her sugars are still abnormal, may recommend earlier delivery.  Of note, 08/31/24 EFW 83%.  2. Marginal insertion of umbilical cord affecting management of mother Normal recent EFW  3. [redacted] weeks gestation of pregnancy 4. Supervision of high-risk pregnancy, third trimester Negative pelvic cultures last week.  Preterm labor symptoms and general obstetric precautions including but not limited to vaginal bleeding, contractions, leaking of fluid and fetal movement were reviewed in detail with the patient. Please refer to After Visit Summary for other counseling recommendations.   Return in about 9 days (around 09/21/2023) for OFFICE OB VISIT (MD only) as scheduled.  Future Appointments  Date Time Provider Department Center  09/21/2023  8:15 AM Stinson, Jacob J, DO [facility] None  09/29/2023  8:15 AM Stinson, Jacob J, DO [facility] None  09/30/2023  8:15 AM [facility] NURSE [facility]  09/30/2023  8:30 AM [facility] US2 [facility]  10/05/2023  8:15 AM Stinson, Jacob J, DO [facility] None    Gloris Hugger, MD

## 2023-09-12 NOTE — Patient Instructions (Addendum)
 IOL scheduled midnight on 09/30/23, this means arriving to hospital at 11:45 pm on 09/29/23.  Return to office for any scheduled appointments. Call the office or go to the MAU at [facility] at [facility] if: You begin to have strong, frequent contractions Your water breaks.  Sometimes it is a big gush of fluid, sometimes it is just a trickle that keeps getting your underwear wet or running down your legs You have vaginal bleeding.  It is normal to have a small amount of spotting if your cervix was checked.  You do not feel your baby moving like normal.  If you do not, get something to eat and drink and lay down and focus on feeling your baby move.   If your baby is still not moving like normal, you should call the office or go to MAU. Any other obstetric concerns.

## 2023-09-21 ENCOUNTER — Ambulatory Visit (INDEPENDENT_AMBULATORY_CARE_PROVIDER_SITE_OTHER): Payer: 59 | Admitting: Family Medicine

## 2023-09-21 VITALS — BP 137/77 | HR 100 | Wt 219.0 lb

## 2023-09-21 DIAGNOSIS — O24410 Gestational diabetes mellitus in pregnancy, diet controlled: Secondary | ICD-10-CM

## 2023-09-21 DIAGNOSIS — O0993 Supervision of high risk pregnancy, unspecified, third trimester: Secondary | ICD-10-CM

## 2023-09-21 DIAGNOSIS — Z3A38 38 weeks gestation of pregnancy: Secondary | ICD-10-CM

## 2023-09-21 DIAGNOSIS — Z8759 Personal history of other complications of pregnancy, childbirth and the puerperium: Secondary | ICD-10-CM

## 2023-09-21 DIAGNOSIS — O43199 Other malformation of placenta, unspecified trimester: Secondary | ICD-10-CM

## 2023-09-21 NOTE — Progress Notes (Signed)
   PRENATAL VISIT NOTE  Subjective:  Pamela Klein is a 33 y.o. G2P1001 at [redacted]w[redacted]d being seen today for ongoing prenatal care.  She is currently monitored for the following issues for this high-risk pregnancy and has History of gestational hypertension; Supervision of high-risk pregnancy, third trimester; Obesity in pregnancy, antepartum; Prediabetes; Marginal insertion of umbilical cord affecting management of mother; and Gestational diabetes mellitus (GDM) in third trimester on their problem list.  Patient reports occasional contractions.  Contractions: Regular. Vag. Bleeding: None.  Movement: Present. Denies leaking of fluid.   The following portions of the patient's history were reviewed and updated as appropriate: allergies, current medications, past family history, past medical history, past social history, past surgical history and problem list.   Objective:   Vitals:   09/21/23 0828  BP: 137/77  Pulse: 100  Weight: 219 lb (99.3 kg)    Fetal Status: Fetal Heart Rate (bpm): 138   Movement: Present  Presentation: Vertex  General:  Alert, oriented and cooperative. Patient is in no acute distress.  Skin: Skin is warm and dry. No rash noted.   Cardiovascular: Normal heart rate noted  Respiratory: Normal respiratory effort, no problems with respiration noted  Abdomen: Soft, gravid, appropriate for gestational age.  Pain/Pressure: Present     Pelvic: Cervical exam performed in the presence of a chaperone Dilation: 1.5 Effacement (%): Thick Station: -3  Extremities: Normal range of motion.  Edema: None  Mental Status: Normal mood and affect. Normal behavior. Normal judgment and thought content.   Assessment and Plan:  Pregnancy: G2P1001 at [redacted]w[redacted]d 1. [redacted] weeks gestation of pregnancy (Primary)  2. Supervision of high-risk pregnancy, third trimester FHT normal  3. Diet controlled gestational diabetes mellitus (GDM) in third trimester Controlled.  EFW 83% Induction next week Will  place foley balloon  4. History of gestational hypertension BP normal  5. Marginal insertion of umbilical cord affecting management of mother Normal growth  Term labor symptoms and general obstetric precautions including but not limited to vaginal bleeding, contractions, leaking of fluid and fetal movement were reviewed in detail with the patient. Please refer to After Visit Summary for other counseling recommendations.   No follow-ups on file.  Future Appointments  Date Time Provider Department Center  09/29/2023  8:15 AM Tuana Hoheisel J, DO [facility] None  09/30/2023 12:00 AM [facility] SCHED ROOM [facility] None  09/30/2023  8:15 AM [facility] NURSE [facility]  09/30/2023  8:30 AM [facility] US2 [facility]  10/05/2023  8:15 AM Cathyann Cobia, Pierrette Brick, DO [facility] None    Vy Badley J Alveta Quintela, DO

## 2023-09-23 ENCOUNTER — Telehealth (HOSPITAL_COMMUNITY): Payer: Self-pay | Admitting: *Deleted

## 2023-09-23 ENCOUNTER — Encounter (HOSPITAL_COMMUNITY): Payer: Self-pay

## 2023-09-23 NOTE — Telephone Encounter (Signed)
Preadmission screen  

## 2023-09-29 ENCOUNTER — Ambulatory Visit: Payer: 59 | Admitting: Family Medicine

## 2023-09-29 ENCOUNTER — Other Ambulatory Visit: Payer: Self-pay | Admitting: Advanced Practice Midwife

## 2023-09-29 VITALS — BP 129/85 | HR 91 | Wt 220.4 lb

## 2023-09-29 DIAGNOSIS — Z3A39 39 weeks gestation of pregnancy: Secondary | ICD-10-CM

## 2023-09-29 DIAGNOSIS — O43193 Other malformation of placenta, third trimester: Secondary | ICD-10-CM | POA: Diagnosis not present

## 2023-09-29 DIAGNOSIS — O24410 Gestational diabetes mellitus in pregnancy, diet controlled: Secondary | ICD-10-CM

## 2023-09-29 DIAGNOSIS — O43199 Other malformation of placenta, unspecified trimester: Secondary | ICD-10-CM

## 2023-09-29 DIAGNOSIS — O0993 Supervision of high risk pregnancy, unspecified, third trimester: Secondary | ICD-10-CM

## 2023-09-29 NOTE — Progress Notes (Signed)
   PRENATAL VISIT NOTE  Subjective:  Pamela Klein is a 33 y.o. G2P1001 at [redacted]w[redacted]d being seen today for ongoing prenatal care.  She is currently monitored for the following issues for this high-risk pregnancy and has History of gestational hypertension; Supervision of high-risk pregnancy, third trimester; Obesity in pregnancy, antepartum; Prediabetes; Marginal insertion of umbilical cord affecting management of mother; and Gestational diabetes mellitus (GDM) in third trimester on their problem list.  Patient reports no complaints.  Contractions: Irritability (15 to 20 min. a part). Vag. Bleeding: None.  Movement: Present. Denies leaking of fluid.   The following portions of the patient's history were reviewed and updated as appropriate: allergies, current medications, past family history, past medical history, past social history, past surgical history and problem list. Problem list updated.  Objective:   Vitals:   09/29/23 0833  BP: 129/85  Pulse: 91  Weight: 220 lb 6.4 oz (100 kg)    Fetal Status:     Movement: Present  Presentation: Vertex  General:  Alert, oriented and cooperative. Patient is in no acute distress.  Skin: Skin is warm and dry. No rash noted.   Cardiovascular: Normal heart rate noted  Respiratory: Normal respiratory effort, no problems with respiration noted  Abdomen: Soft, gravid, appropriate for gestational age.  Pain/Pressure: Present     Pelvic: Cervical exam performed Dilation: 1.5 Effacement (%): 50 Station: -3  Extremities: Normal range of motion.  Edema: Trace  Mental Status:  Normal mood and affect. Normal behavior. Normal judgment and thought content.  Procedure: Patient informed of Risks, benefits, and alternatives of procedure.   Procedure done to begin ripening of the cervix prior to admission for induction of labor. Appropriate time out taken. The patient was placed in the lithotomy position and a cervical exam was performed and a finger was used to  guide the 56F foley through the internal os of the cervix. Foley Balloon filled with  40 cc of sterile water. Plug inserted into end of the foley. Foley placed on tension and taped to medial thigh. All equipment was removed and accounted for. The patient tolerated the procedure well.   NST:  EFM Baseline: 125 bpm, Variability: Good {> 6 bpm), Accelerations: Reactive, and Decelerations: Absent  Toco: irregular There were no signs of tachysystole or hypertonus.  Assessment and Plan:  Pregnancy: G2P1001 at [redacted]w[redacted]d 1. Supervision of high-risk pregnancy, third trimester (Primary) FHT normal  2. Marginal insertion of umbilical cord affecting management of mother  3. Diet controlled gestational diabetes mellitus (GDM) in third trimester Induction tonight.   S/p Outpatient placement of foley balloon catheter for cervical ripening. Induction of labor scheduled for tonight at 1200 pm. Reassuring FHR tracing with no concerns at present. Warning signs given to patient to include return to MAU for heavy vaginal bleeding, Rupture of membranes, painful uterine contractions q 5 mins or less, severe abdominal discomfort, decreased fetal movement.  No follow-ups on file.   Levie Heritage, DO 09/29/2023 3:29 PM

## 2023-09-30 ENCOUNTER — Inpatient Hospital Stay (HOSPITAL_COMMUNITY): Payer: 59 | Admitting: Anesthesiology

## 2023-09-30 ENCOUNTER — Other Ambulatory Visit: Payer: Self-pay

## 2023-09-30 ENCOUNTER — Inpatient Hospital Stay (HOSPITAL_COMMUNITY): Payer: 59

## 2023-09-30 ENCOUNTER — Encounter (HOSPITAL_COMMUNITY): Payer: Self-pay | Admitting: Obstetrics & Gynecology

## 2023-09-30 ENCOUNTER — Inpatient Hospital Stay (HOSPITAL_COMMUNITY)
Admission: RE | Admit: 2023-09-30 | Discharge: 2023-10-03 | DRG: 807 | Disposition: A | Discharge status: Home / Self Care | Payer: 59 | Attending: Obstetrics and Gynecology | Admitting: Obstetrics and Gynecology

## 2023-09-30 ENCOUNTER — Ambulatory Visit: Payer: 59

## 2023-09-30 DIAGNOSIS — O43193 Other malformation of placenta, third trimester: Secondary | ICD-10-CM | POA: Diagnosis present

## 2023-09-30 DIAGNOSIS — O0993 Supervision of high risk pregnancy, unspecified, third trimester: Secondary | ICD-10-CM

## 2023-09-30 DIAGNOSIS — Z833 Family history of diabetes mellitus: Secondary | ICD-10-CM

## 2023-09-30 DIAGNOSIS — Z3A39 39 weeks gestation of pregnancy: Secondary | ICD-10-CM | POA: Diagnosis not present

## 2023-09-30 DIAGNOSIS — O99214 Obesity complicating childbirth: Secondary | ICD-10-CM | POA: Diagnosis present

## 2023-09-30 DIAGNOSIS — O4423 Partial placenta previa NOS or without hemorrhage, third trimester: Secondary | ICD-10-CM | POA: Diagnosis not present

## 2023-09-30 DIAGNOSIS — Z8249 Family history of ischemic heart disease and other diseases of the circulatory system: Secondary | ICD-10-CM | POA: Diagnosis not present

## 2023-09-30 DIAGNOSIS — O24420 Gestational diabetes mellitus in childbirth, diet controlled: Secondary | ICD-10-CM | POA: Diagnosis present

## 2023-09-30 DIAGNOSIS — Z8759 Personal history of other complications of pregnancy, childbirth and the puerperium: Secondary | ICD-10-CM

## 2023-09-30 DIAGNOSIS — O24410 Gestational diabetes mellitus in pregnancy, diet controlled: Secondary | ICD-10-CM

## 2023-09-30 DIAGNOSIS — Z87891 Personal history of nicotine dependence: Secondary | ICD-10-CM | POA: Diagnosis not present

## 2023-09-30 DIAGNOSIS — O24419 Gestational diabetes mellitus in pregnancy, unspecified control: Principal | ICD-10-CM | POA: Diagnosis present

## 2023-09-30 DIAGNOSIS — O99210 Obesity complicating pregnancy, unspecified trimester: Secondary | ICD-10-CM | POA: Diagnosis present

## 2023-09-30 LAB — CBC
HCT: 35.7 % — ABNORMAL LOW (ref 36.0–46.0)
Hemoglobin: 11.9 g/dL — ABNORMAL LOW (ref 12.0–15.0)
MCH: 28.8 pg (ref 26.0–34.0)
MCHC: 33.3 g/dL (ref 30.0–36.0)
MCV: 86.4 fL (ref 80.0–100.0)
Platelets: 188 10*3/uL (ref 150–400)
RBC: 4.13 MIL/uL (ref 3.87–5.11)
RDW: 14.5 % (ref 11.5–15.5)
WBC: 8.4 10*3/uL (ref 4.0–10.5)
nRBC: 0 % (ref 0.0–0.2)

## 2023-09-30 LAB — GLUCOSE, CAPILLARY
Glucose-Capillary: 111 mg/dL — ABNORMAL HIGH (ref 70–99)
Glucose-Capillary: 105 mg/dL — ABNORMAL HIGH (ref 70–99)
Glucose-Capillary: 101 mg/dL — ABNORMAL HIGH (ref 70–99)
Glucose-Capillary: 110 mg/dL — ABNORMAL HIGH (ref 70–99)
Glucose-Capillary: 96 mg/dL (ref 70–99)
Glucose-Capillary: 150 mg/dL — ABNORMAL HIGH (ref 70–99)

## 2023-09-30 LAB — TYPE AND SCREEN
ABO/RH(D): A POS
Antibody Screen: NEGATIVE

## 2023-09-30 LAB — RPR: RPR Ser Ql: NONREACTIVE

## 2023-09-30 MED ORDER — HYDROXYZINE HCL 50 MG PO TABS
50.0000 mg | ORAL_TABLET | Freq: Four times a day (QID) | ORAL | Status: DC | PRN
Start: 2023-09-30 — End: 2023-10-01

## 2023-09-30 MED ORDER — OXYTOCIN-SODIUM CHLORIDE 30-0.9 UT/500ML-% IV SOLN
2.5000 [IU]/h | INTRAVENOUS | Status: DC
  Filled 2023-09-30: qty 500

## 2023-09-30 MED ORDER — FENTANYL CITRATE (PF) 100 MCG/2ML IJ SOLN
100.0000 ug | INTRAMUSCULAR | Status: DC | PRN
  Administered 2023-09-30 (×2): 100 ug via INTRAVENOUS
  Filled 2023-09-30 (×2): qty 2

## 2023-09-30 MED ORDER — SOD CITRATE-CITRIC ACID 500-334 MG/5ML PO SOLN: 30.0000 mL | ORAL | Status: DC | PRN

## 2023-09-30 MED ORDER — OXYTOCIN BOLUS FROM INFUSION
333.0000 mL | Freq: Once | INTRAVENOUS | Status: AC
  Administered 2023-10-01: 333 mL via INTRAVENOUS

## 2023-09-30 MED ORDER — FENTANYL CITRATE (PF) 100 MCG/2ML IJ SOLN: 50.0000 ug | INTRAMUSCULAR | Status: DC | PRN

## 2023-09-30 MED ORDER — FENTANYL-BUPIVACAINE-NACL 0.5-0.125-0.9 MG/250ML-% EP SOLN: 12.0000 mL/h | EPIDURAL | Status: DC | PRN

## 2023-09-30 MED ORDER — LACTATED RINGERS IV SOLN: 500.0000 mL | Freq: Once | INTRAVENOUS | Status: DC

## 2023-09-30 MED ORDER — LACTATED RINGERS IV SOLN
500.0000 mL | INTRAVENOUS | Status: DC | PRN
  Administered 2023-09-30 (×3): 500 mL via INTRAVENOUS

## 2023-09-30 MED ORDER — OXYTOCIN-SODIUM CHLORIDE 30-0.9 UT/500ML-% IV SOLN
1.0000 m[IU]/min | INTRAVENOUS | Status: DC
  Administered 2023-09-30: 2 m[IU]/min via INTRAVENOUS
  Filled 2023-09-30: qty 500

## 2023-09-30 MED ORDER — LIDOCAINE HCL (PF) 1 % IJ SOLN
INTRAMUSCULAR | Status: DC | PRN
  Administered 2023-09-30: 8 mL via EPIDURAL

## 2023-09-30 MED ORDER — DIPHENHYDRAMINE HCL 50 MG/ML IJ SOLN: 12.5000 mg | INTRAMUSCULAR | Status: DC | PRN

## 2023-09-30 MED ORDER — PHENYLEPHRINE 80 MCG/ML (10ML) SYRINGE FOR IV PUSH (FOR BLOOD PRESSURE SUPPORT)
80.0000 ug | PREFILLED_SYRINGE | INTRAVENOUS | Status: DC | PRN
  Administered 2023-10-01: 80 ug via INTRAVENOUS

## 2023-09-30 MED ORDER — EPHEDRINE 5 MG/ML INJ: 10.0000 mg | INTRAVENOUS | Status: DC | PRN

## 2023-09-30 MED ORDER — PHENYLEPHRINE 80 MCG/ML (10ML) SYRINGE FOR IV PUSH (FOR BLOOD PRESSURE SUPPORT): 80.0000 ug | PREFILLED_SYRINGE | INTRAVENOUS | Status: DC | PRN

## 2023-09-30 MED ORDER — MISOPROSTOL 50MCG HALF TABLET: 50.0000 ug | ORAL_TABLET | Freq: Once | ORAL | Status: DC

## 2023-09-30 MED ORDER — ZOLPIDEM TARTRATE 5 MG PO TABS
5.0000 mg | ORAL_TABLET | Freq: Every evening | ORAL | Status: DC | PRN
Start: 2023-09-30 — End: 2023-10-01

## 2023-09-30 MED ORDER — FENTANYL-BUPIVACAINE-NACL 0.5-0.125-0.9 MG/250ML-% EP SOLN
12.0000 mL/h | EPIDURAL | Status: DC | PRN
  Administered 2023-09-30 – 2023-10-01 (×2): 12 mL/h via EPIDURAL
  Filled 2023-09-30 (×3): qty 250

## 2023-09-30 MED ORDER — ACETAMINOPHEN 325 MG PO TABS: 650.0000 mg | ORAL_TABLET | ORAL | Status: DC | PRN

## 2023-09-30 MED ORDER — LIDOCAINE HCL (PF) 1 % IJ SOLN: 30.0000 mL | INTRAMUSCULAR | Status: DC | PRN

## 2023-09-30 MED ORDER — MISOPROSTOL 25 MCG QUARTER TABLET: 25.0000 ug | ORAL_TABLET | Freq: Once | ORAL | Status: DC

## 2023-09-30 MED ORDER — PHENYLEPHRINE 80 MCG/ML (10ML) SYRINGE FOR IV PUSH (FOR BLOOD PRESSURE SUPPORT)
80.0000 ug | PREFILLED_SYRINGE | INTRAVENOUS | Status: DC | PRN
  Filled 2023-09-30: qty 10

## 2023-09-30 MED ORDER — ONDANSETRON HCL 4 MG/2ML IJ SOLN
4.0000 mg | Freq: Four times a day (QID) | INTRAMUSCULAR | Status: DC | PRN
  Administered 2023-09-30 – 2023-10-01 (×2): 4 mg via INTRAVENOUS
  Filled 2023-09-30 (×2): qty 2

## 2023-09-30 MED ORDER — OXYCODONE-ACETAMINOPHEN 5-325 MG PO TABS
1.0000 | ORAL_TABLET | ORAL | Status: DC | PRN
Start: 2023-09-30 — End: 2023-10-01

## 2023-09-30 MED ORDER — TERBUTALINE SULFATE 1 MG/ML IJ SOLN: 0.2500 mg | Freq: Once | INTRAMUSCULAR | Status: DC | PRN

## 2023-09-30 MED ORDER — OXYCODONE-ACETAMINOPHEN 5-325 MG PO TABS
2.0000 | ORAL_TABLET | ORAL | Status: DC | PRN
Start: 2023-09-30 — End: 2023-10-01

## 2023-09-30 MED ORDER — FLEET ENEMA RE ENEM: 1.0000 | ENEMA | Freq: Every day | RECTAL | Status: DC | PRN

## 2023-09-30 MED ORDER — LACTATED RINGERS IV SOLN: INTRAVENOUS | Status: AC

## 2023-09-30 NOTE — Progress Notes (Signed)
Labor Progress Note Pamela Klein is a 33 y.o. G2P1001 at [redacted]w[redacted]d presented for IOL for A1GDM.  S:  Coping well with bedside labor support: doula "Cassey", FOB "Ruther" and sister Daryl Eastern) and cousin Albin Felling)  O:  BP 112/78   Pulse 85   Temp 97.9 F (36.6 C) (Oral)   Resp 16   Ht 5\' 4"  (1.626 m)   Wt 100.5 kg   LMP 12/29/2022 (Exact Date)   SpO2 99%   BMI 38.04 kg/m   EFM: baseline 135 bpm/ mod variability/ 15x15  accels/ late, variable decels  Toco/IUPC: 4-5  SVE: Dilation: 4.5 Effacement (%): 50 Station: -3 Presentation: Vertex Exam by:: L Strickland, RNC Pitocin: 4 mu/min  A/P: 33 y.o. G2P1001 [redacted]w[redacted]d her for IOL for A1GDM, h/o of gHTN.   1. Labor: Adequate progress in latent phase of labor. Augmentation with AROM and pit. Anticipate progress to active phase soon.  2. FWB: Cat 2, overall reassuring.  3. Pain: Coping well without medication with bedside labor support from doula, FOB, sister and cousin as above. Medication/epidural on request. Patient has a history of unmedicated delivery.  4. GBS negative   Anticipate NVSB.  Richardson Landry, CNM 9:49 AM

## 2023-09-30 NOTE — Progress Notes (Signed)
Labor Progress Note Pamela Klein is a 33 y.o. G2P1001 at 100w2d presented for IOL for A1GDM  S:  Comfortable s/p epidural placement.   O:  BP 126/71   Pulse 93   Temp 98 F (36.7 C) (Oral)   Resp 18   Ht 5\' 4"  (1.626 m)   Wt 100.5 kg   LMP 12/29/2022 (Exact Date)   SpO2 99%   BMI 38.04 kg/m   EFM: baseline 135 bpm/ moderate variability/ 15 x 15  accels/ variable decels  Toco/IUPC: 3-7  SVE: Dilation: 5 Effacement (%): 80 Cervical Position: Posterior Station: -1 Presentation: Vertex Exam by:: [name], CNM Pitocin: 8 mu/min  A/P: 33 y.o. G2P1001 [redacted]w[redacted]d IOL for A1GDM.   1. Labor: Minimal progress noted throughout day. Patient recently comfortable with epidural and amendable to IUPC placement. RBA of IUPC discussed and patient wishes to proceed. Placed easily. Titrate pitocin for adequate MVUs.  2. FWB: Cat 2, reassuring overall 3. Pain: Comfortable 4. GBS negative    Anticipate NVSB.  Richardson Landry, CNM 7:49 PM

## 2023-09-30 NOTE — Anesthesia Preprocedure Evaluation (Addendum)
Anesthesia Evaluation  Patient identified by MRN, date of birth, ID band Patient awake    Reviewed: Allergy & Precautions, H&P , NPO status , Patient's Chart, lab work & pertinent test results  Airway Mallampati: II  TM Distance: >3 FB Neck ROM: Full    Dental no notable dental hx.    Pulmonary former smoker   Pulmonary exam normal breath sounds clear to auscultation       Cardiovascular negative cardio ROS Normal cardiovascular exam Rhythm:Regular Rate:Normal     Neuro/Psych negative neurological ROS  negative psych ROS   GI/Hepatic negative GI ROS, Neg liver ROS,,,  Endo/Other  diabetes, Gestational    Renal/GU negative Renal ROS  negative genitourinary   Musculoskeletal negative musculoskeletal ROS (+)    Abdominal   Peds negative pediatric ROS (+)  Hematology negative hematology ROS (+)   Anesthesia Other Findings   Reproductive/Obstetrics negative OB ROS                             Anesthesia Physical Anesthesia Plan  ASA: 3  Anesthesia Plan: Epidural   Post-op Pain Management:    Induction:   PONV Risk Score and Plan: 2 and Treatment may vary due to age or medical condition  Airway Management Planned:   Additional Equipment: None  Intra-op Plan:   Post-operative Plan:   Informed Consent: I have reviewed the patients History and Physical, chart, labs and discussed the procedure including the risks, benefits and alternatives for the proposed anesthesia with the patient or authorized representative who has indicated his/her understanding and acceptance.     Dental Advisory Given  Plan Discussed with: Anesthesiologist and CRNA  Anesthesia Plan Comments: (Labs checked- platelets confirmed with RN in room. Fetal heart tracing, per RN, reported to be stable enough for sitting procedure. Discussed epidural, and patient consents to the procedure:  included risk of  possible headache,backache, failed block, allergic reaction, and nerve injury. This patient was asked if she had any questions or concerns before the procedure started.)       Anesthesia Quick Evaluation

## 2023-09-30 NOTE — Progress Notes (Signed)
FHR Cat 1.  Ctx still mild, q 4-7  minutes.  Cx 4.5/50/-3 per RN exam. Will start pitocin.

## 2023-09-30 NOTE — Progress Notes (Signed)
Labor Progress Note Pamela Klein is a 33 y.o. G2P1001 at [redacted]w[redacted]d presented for IOL for A1GDM.   S:  Difficulty with bedside labor support  O:  BP (!) 122/56   Pulse 85   Temp 97.8 F (36.6 C) (Axillary)   Resp 16   Ht 5\' 4"  (1.626 m)   Wt 100.5 kg   LMP 12/29/2022 (Exact Date)   SpO2 99%   BMI 38.04 kg/m   EFM: baseline 140 bpm/ moderate variability/ 15x15 accels/ variable decels  Toco/IUPC: 2-4 moderate  SVE: Dilation: 5 Effacement (%): 50, 60 Cervical Position: Posterior Station: -2 Presentation: Vertex Exam by:: [name], CNM Pitocin: 8 mu/min  A/P: 33 y.o. G2P1001 [redacted]w[redacted]d IOL for A1GDM h/o gHTN in previous pregnancy.   1. Labor: IOL in latent phase. Minimal to no progress following AROM and pitocin titration. Recommend IUPC and continue to titrate pitocin. Patient still unmedicated, desires to think about this intervention. Pain relief as below.  2. FWB: Cat 2, overall reassuring 3. Pain: IV fentanyl now, consider epidural or nitrous.  4. GBS negative.    Anticipate NVSB.  Richardson Landry, CNM 3:18 PM

## 2023-09-30 NOTE — Anesthesia Procedure Notes (Signed)
Epidural Patient location during procedure: OB Start time: 09/30/2023 4:53 PM End time: 09/30/2023 5:00 PM  Staffing Anesthesiologist: Bethena Midget, MD  Preanesthetic Checklist Completed: patient identified, IV checked, site marked, risks and benefits discussed, surgical consent, monitors and equipment checked, pre-op evaluation and timeout performed  Epidural Patient position: sitting Prep: DuraPrep and site prepped and draped Patient monitoring: continuous pulse ox and blood pressure Approach: midline Location: L3-L4 Injection technique: LOR air  Needle:  Needle type: Tuohy  Needle gauge: 17 G Needle length: 9 cm and 9 Needle insertion depth: 6 cm Catheter type: closed end flexible Catheter size: 19 Gauge Catheter at skin depth: 12 cm Test dose: negative  Assessment Events: blood not aspirated, no cerebrospinal fluid, injection not painful, no injection resistance, no paresthesia and negative IV test

## 2023-09-30 NOTE — H&P (Cosign Needed Addendum)
OBSTETRIC ADMISSION HISTORY AND PHYSICAL  Pamela Klein is a 33 y.o. female G2P1001 with IUP at [redacted]w[redacted]d by LMP presenting for IOL for A1DM. She reports +FMs, No LOF, no VB, no blurry vision, headaches or peripheral edema, and RUQ pain.  She plans on breast feeding. She is unsure what she will use for birth control. She received her prenatal care at  [facility]    Dating: By LMP --->  Estimated Date of Delivery: 10/05/23  Sono:    @[redacted]w[redacted]d , CWD, normal anatomy, cephalic presentation, posterior placenta - marginal insertion, 2948g, 83% EFW   Prenatal History/Complications:   NURSING  PROVIDER  Office Location [place] Dating by LMP c/w U/S at X wks  [facility] Model Traditional Anatomy U/S Normal  Initiated care at  14wks                Language  English              LAB RESULTS   Support Person  Genetics NIPS: LR M AFP:     NT/IT (FT only)     Carrier Screen Horizon: neg x 4  Rhogam  A/Positive/-- (08/01 1420) A1C/GTT Early: A1C 5.7 [ ]  GTT Third trimester:   Flu Vaccine     TDaP Vaccine 09/05/23 Blood Type A/Positive/-- (08/01 1420)  Covid Vaccine  Antibody Negative (08/01 1420)  RSV Vaccine 09/05/23 Rubella 1.02 (08/01 1420)  Feeding Plan breast RPR Non Reactive (08/01 1420)  Contraception  HBsAg Negative (08/01 1420)  Circumcision  HIV Non Reactive (08/01 1420)  Pediatrician   HCVAb Non Reactive (08/01 1420)  Prenatal Classes       Pap Diagnosis  Date Value Ref Range Status  04/07/2023   Final   - Negative for intraepithelial lesion or malignancy (NILM)    BTLConsent  GC/CT Initial:   36wks:    VBAC  Consent  GBS   For PCN allergy, check sensitivities        DME Rx [ ]  BP cuff [ ]  Weight Scale Waterbirth  [ ]  Class [ ]  Consent [ ]  CNM visit  PHQ9 & GAD7 [  ] new OB [  ] 28 weeks  [  ] 36 weeks Induction  [ ]  Orders Entered [ ] Foley Y/N     Past Medical History: Past Medical History:  Diagnosis Date   Cannabis abuse 05/01/2013   Chlamydia    Chronic back pain     Community acquired pneumonia 05/01/2013   H/O varicella    Sepsis (HCC) 05/01/2013   Vitamin D deficiency 11/28/2017    Past Surgical History: Past Surgical History:  Procedure Laterality Date   NO PAST SURGERIES      Obstetrical History: OB History     Gravida  2   Para  1   Term  1   Preterm  0   AB  0   Living  1      SAB  0   IAB  0   Ectopic  0   Multiple  0   Live Births  1           Social History Social History   Socioeconomic History   Marital status: Single    Spouse name: Not on file   Number of children: Not on file   Years of education: Not on file   Highest education level: Not on file  Occupational History   Not on file  Tobacco Use   Smoking status: Former  Current packs/day: 0.00    Types: Cigarettes    Start date: 04/05/2008    Quit date: 04/06/2011    Years since quitting: 12.4   Smokeless tobacco: Never   Tobacco comments:    05/01/2013 "smoked Black N Milds"  Vaping Use   Vaping status: Never Used  Substance and Sexual Activity   Alcohol use: Not Currently    Comment: 05/01/2013 "none recently" -occasional   Drug use: Not Currently   Sexual activity: Yes    Partners: Male    Comment: not since July  Other Topics Concern   Not on file  Social History Narrative   Not on file   Social Drivers of Health   Financial Resource Strain: Not on file  Food Insecurity: No Food Insecurity (09/30/2023)   Hunger Vital Sign    Worried About Running Out of Food in the Last Year: Never true    Ran Out of Food in the Last Year: Never true  Transportation Needs: No Transportation Needs (09/30/2023)   PRAPARE - Administrator, Civil Service (Medical): No    Lack of Transportation (Non-Medical): No  Physical Activity: Not on file  Stress: No Stress Concern Present (08/05/2023)   Received from [facility] Occupational Health - Occupational Stress Questionnaire    Feeling of Stress : Not at all   Social Connections: Moderately Isolated (09/30/2023)   Social Connection and Isolation Panel [NHANES]    Frequency of Communication with Friends and Family: More than three times a week    Frequency of Social Gatherings with Friends and Family: Three times a week    Attends Religious Services: 1 to 4 times per year    Active Member of Clubs or Organizations: No    Attends Banker Meetings: Never    Marital Status: Never married    Family History: Family History  Problem Relation Age of Onset   Hypertension Father    Diabetes Paternal Grandmother     Allergies: No Known Allergies  Medications Prior to Admission  Medication Sig Dispense Refill Last Dose/Taking   Accu-Chek Softclix Lancets lancets 100 each by Other route 4 (four) times daily. DX: Z61:096. Check blood sugar four times a day. 100 each 12 09/29/2023   aspirin EC 81 MG tablet Take 1 tablet (81 mg total) by mouth at bedtime. Please take daily from now until end of pregnancy for prevention of preeclampsia 300 tablet 2 09/29/2023 Morning   glucose blood (ACCU-CHEK GUIDE TEST) test strip DX: O24:419. Check blood sugar four times a day. 100 each 12 09/29/2023   ondansetron (ZOFRAN) 4 MG tablet Take 1 tablet (4 mg total) by mouth every 8 (eight) hours as needed for nausea or vomiting. 20 tablet 0 Past Month   Prenatal Vit-Fe Fumarate-FA (MULTIVITAMIN-PRENATAL) 27-0.8 MG TABS tablet Take 1 tablet by mouth daily at 12 noon.   09/29/2023 Morning   Blood Glucose Monitoring Suppl (ACCU-CHEK GUIDE) w/Device KIT DX: O24:419. Check blood sugar four times a day. 1 kit 0    ondansetron (ZOFRAN-ODT) 4 MG disintegrating tablet Take 1 tablet (4 mg total) by mouth every 6 (six) hours as needed for nausea. (Patient not taking: Reported on 09/29/2023) 20 tablet 1 More than a month   scopolamine (TRANSDERM-SCOP) 1 MG/3DAYS Place 1 patch (1.5 mg total) onto the skin every 3 (three) days. (Patient not taking: Reported on 05/12/2023) 10 patch 12  More than a month     Review of Systems  All systems reviewed and negative except as stated in HPI  Blood pressure 139/78, pulse 71, temperature 98.1 F (36.7 C), temperature source Oral, resp. rate 15, height 5\' 4"  (1.626 m), weight 100.5 kg, last menstrual period 12/29/2022. General appearance: alert and cooperative Lungs: clear to auscultation bilaterally Heart: regular rate and rhythm Abdomen: soft, non-tender; bowel sounds normal Extremities: Homans sign is negative, no sign of DVT Presentation: cephalic Fetal monitoringBaseline: 150 bpm, Variability: Good {> 6 bpm), Accelerations: Reactive, and Decelerations: Absent Uterine activity: irregular ctx Dilation: 4 Effacement (%): 50 Station: -2 Exam by:: F Cresenzo-Dishmon, CNM  Prenatal labs: ABO, Rh: --/--/A POS (01/24 0032) Antibody: NEG (01/24 0032) Rubella: 1.02 (08/01 1420) RPR: Non Reactive (11/22 0841)  HBsAg: Negative (08/01 1420)  HIV: Non Reactive (11/22 0841)  GBS: Negative/-- (12/30 0934)    Lab Results  Component Value Date   GBS Negative 09/05/2023   GTT: positive Genetic screening: negative Anatomy US: all structures visualized and appear normal  Immunization History  Administered Date(s) Administered   MMR 02/29/2012   Pneumococcal Polysaccharide-23 05/02/2013   Rsv, Bivalent, Protein Subunit Rsvpref,pf Verdis Frederickson) 09/05/2023   Tdap 02/28/2012, 09/05/2023    Prenatal Transfer Tool  Maternal Diabetes: Yes:  Diabetes Type:  Diet controlled Genetic Screening: Normal Maternal Ultrasounds/Referrals: Normal Fetal Ultrasounds or other Referrals:  None Maternal Substance Abuse:  No Significant Maternal Medications:  None Significant Maternal Lab Results: Group B Strep negative Number of Prenatal Visits:greater than 3 verified prenatal visits Maternal Vaccinations:RSV: Given during pregnancy >/=14 days ago and TDap Other Comments:  None   Results for orders placed or performed during the hospital  encounter of 09/30/23 (from the past 24 hours)  CBC   Collection Time: 09/30/23 12:32 AM  Result Value Ref Range   WBC 8.4 4.0 - 10.5 K/uL   RBC 4.13 3.87 - 5.11 MIL/uL   Hemoglobin 11.9 (L) 12.0 - 15.0 g/dL   HCT 82.9 (L) 56.2 - 13.0 %   MCV 86.4 80.0 - 100.0 fL   MCH 28.8 26.0 - 34.0 pg   MCHC 33.3 30.0 - 36.0 g/dL   RDW 86.5 78.4 - 69.6 %   Platelets 188 150 - 400 K/uL   nRBC 0.0 0.0 - 0.2 %  Type and screen   Collection Time: 09/30/23 12:32 AM  Result Value Ref Range   ABO/RH(D) A POS    Antibody Screen NEG    Sample Expiration      10/03/2023,2359 Performed at [facility], [address], [place], [place] [address]   Glucose, capillary   Collection Time: 09/30/23  2:33 AM  Result Value Ref Range   Glucose-Capillary 111 (H) 70 - 99 mg/dL   Comment 1 Notify RN     Patient Active Problem List   Diagnosis Date Noted   Gestational diabetes mellitus 09/30/2023   Gestational diabetes mellitus (GDM) in third trimester 08/02/2023   Marginal insertion of umbilical cord affecting management of mother 06/30/2023   Prediabetes 04/19/2023   History of gestational hypertension 04/07/2023   Supervision of high-risk pregnancy, third trimester 04/07/2023   Obesity in pregnancy, antepartum 04/07/2023    Assessment/Plan:  Lillith Mcneff is a 33 y.o. G2P1001 at [redacted]w[redacted]d here for IOL A1GDM.  #Labor: OP Foley balloon out on admission. AROM w/clear fluid.  If ctx don't increase significantly in a few hours, will start pitocin.  #Pain: unmedicated #FWB: Cat 1 #GBS status:  negative #Feeding: Breastmilk  #Reproductive Life planning: Undecided #Circ:  yes  Mary Sella  Acquanetta Belling, MD  09/30/2023, 2:41 AM  I personally saw and evaluated the patient, performing the key elements of the service. I developed and verified the management plan that is described in the resident's/student's note, and I agree with the content with my edits above. VSS, HRR&R, Resp unlabored, Legs neg.  Cathie Beams, CNM 09/30/2023 7:16 AM

## 2023-09-30 NOTE — Progress Notes (Signed)
CNM to bedside per patient request. SCE requested to assess progress.   Dilation: 5 Effacement (%): 50, 60 Cervical Position: Posterior Station: -2 Presentation: Vertex Exam by:: [name], CNM  Patient has a birth plan which indicates she does not desire an epidural or IV pain medication. She is open to nitrous.  Given minimal progress, recommend titration of the pitocin to between 6 and 12.  Recommend resting in a side-lying position, if able to promote rest.  Nitrous or other forms of pain relief on request. Continued labor support.   [name], MSN, CNM, RNC-OB Certified Nurse Midwife, [facility] 09/30/2023 11:04 AM

## 2023-09-30 NOTE — Progress Notes (Signed)
Pamela Klein is a 33 y.o. G2P1001 at [redacted]w[redacted]d admitted for IOL for A1GDM.   Subjective: Pt comfortable with epidural.  Support team at bedside.    Objective: BP (!) 146/76   Pulse (!) 116   Temp 98 F (36.7 C) (Oral)   Resp 18   Ht 5\' 4"  (1.626 m)   Wt 100.5 kg   LMP 12/29/2022 (Exact Date)   SpO2 99%   BMI 38.04 kg/m  No intake/output data recorded. No intake/output data recorded.  FHT:  FHR: 135 bpm, variability: moderate,  accelerations:  Present,  decelerations:  Present periodic variables, resolve with position change UC:   irregular, every 3-7 minutes SVE:   Dilation: 5 Effacement (%): 80 Station: -1 Exam by:: Letitia Neri, RN  Labs: Lab Results  Component Value Date   WBC 8.4 09/30/2023   HGB 11.9 (L) 09/30/2023   HCT 35.7 (L) 09/30/2023   MCV 86.4 09/30/2023   PLT 188 09/30/2023    Assessment / Plan: Induction of labor due to A1GDM,  progressing well on pitocin  Labor:  Pt concerned about labor progress. Discussed process of IOL, expectations with pt.  Currently, contractions are inadequate, and Pitocin is being titrated up to make them stronger.  With adequate contractions, we are expecting more cervical change.  Encouraged pt to continue to change positions to encourage fetal descent.  Will reevaluate in 3-4 hours.  Preeclampsia:   n/a Fetal Wellbeing:  Category II Pain Control:  Epidural I/D:   GBS neg Anticipated MOD:  NSVD  Sharen Counter, CNM 09/30/2023, 10:26 PM

## 2023-10-01 ENCOUNTER — Encounter (HOSPITAL_COMMUNITY): Payer: Self-pay | Admitting: Obstetrics & Gynecology

## 2023-10-01 DIAGNOSIS — O4423 Partial placenta previa NOS or without hemorrhage, third trimester: Secondary | ICD-10-CM

## 2023-10-01 DIAGNOSIS — O24420 Gestational diabetes mellitus in childbirth, diet controlled: Secondary | ICD-10-CM

## 2023-10-01 DIAGNOSIS — Z3A39 39 weeks gestation of pregnancy: Secondary | ICD-10-CM

## 2023-10-01 LAB — GLUCOSE, CAPILLARY
Glucose-Capillary: 108 mg/dL — ABNORMAL HIGH (ref 70–99)
Glucose-Capillary: 104 mg/dL — ABNORMAL HIGH (ref 70–99)

## 2023-10-01 MED ORDER — BENZOCAINE-MENTHOL 20-0.5 % EX AERO
1.0000 | INHALATION_SPRAY | CUTANEOUS | Status: DC | PRN
  Administered 2023-10-01 – 2023-10-03 (×2): 1 via TOPICAL
  Filled 2023-10-01 (×2): qty 56

## 2023-10-01 MED ORDER — OXYCODONE HCL 5 MG PO TABS: 5.0000 mg | ORAL_TABLET | ORAL | 0 refills | Status: DC | PRN

## 2023-10-01 MED ORDER — COCONUT OIL OIL: 1.0000 | TOPICAL_OIL | Status: DC | PRN

## 2023-10-01 MED ORDER — LACTATED RINGERS IV SOLN: INTRAVENOUS | Status: DC

## 2023-10-01 MED ORDER — GABAPENTIN 300 MG PO CAPS
300.0000 mg | ORAL_CAPSULE | Freq: Two times a day (BID) | ORAL | Status: DC
  Administered 2023-10-02 – 2023-10-03 (×4): 300 mg via ORAL
  Filled 2023-10-01 (×4): qty 1

## 2023-10-01 MED ORDER — ONDANSETRON HCL 4 MG/2ML IJ SOLN: 4.0000 mg | INTRAMUSCULAR | Status: DC | PRN

## 2023-10-01 MED ORDER — HYDROXYZINE HCL 50 MG PO TABS: 25.0000 mg | ORAL_TABLET | Freq: Three times a day (TID) | ORAL | Status: DC | PRN

## 2023-10-01 MED ORDER — OXYCODONE HCL 5 MG PO TABS
5.0000 mg | ORAL_TABLET | ORAL | Status: DC | PRN
  Administered 2023-10-01 – 2023-10-02 (×2): 5 mg via ORAL
  Filled 2023-10-01 (×2): qty 1

## 2023-10-01 MED ORDER — ONDANSETRON HCL 4 MG PO TABS: 4.0000 mg | ORAL_TABLET | ORAL | Status: DC | PRN

## 2023-10-01 MED ORDER — SIMETHICONE 80 MG PO CHEW: 80.0000 mg | CHEWABLE_TABLET | ORAL | Status: DC | PRN

## 2023-10-01 MED ORDER — SENNOSIDES-DOCUSATE SODIUM 8.6-50 MG PO TABS
2.0000 | ORAL_TABLET | Freq: Every day | ORAL | Status: DC
  Administered 2023-10-02 – 2023-10-03 (×2): 2 via ORAL
  Filled 2023-10-01 (×2): qty 2

## 2023-10-01 MED ORDER — IBUPROFEN 600 MG PO TABS
600.0000 mg | ORAL_TABLET | Freq: Four times a day (QID) | ORAL | Status: DC
  Administered 2023-10-01 – 2023-10-03 (×8): 600 mg via ORAL
  Filled 2023-10-01 (×8): qty 1

## 2023-10-01 MED ORDER — GABAPENTIN 300 MG PO CAPS: 300.0000 mg | ORAL_CAPSULE | Freq: Two times a day (BID) | ORAL | 0 refills | Status: DC

## 2023-10-01 MED ORDER — WITCH HAZEL-GLYCERIN EX PADS: 1.0000 | MEDICATED_PAD | CUTANEOUS | Status: DC | PRN

## 2023-10-01 MED ORDER — HYDROXYZINE HCL 25 MG PO TABS: 25.0000 mg | ORAL_TABLET | Freq: Three times a day (TID) | ORAL | 2 refills | Status: DC | PRN

## 2023-10-01 MED ORDER — OXYCODONE HCL 5 MG PO TABS
10.0000 mg | ORAL_TABLET | ORAL | Status: DC | PRN
  Administered 2023-10-02 (×2): 10 mg via ORAL
  Filled 2023-10-01 (×2): qty 2

## 2023-10-01 MED ORDER — SENNOSIDES-DOCUSATE SODIUM 8.6-50 MG PO TABS: 2.0000 | ORAL_TABLET | Freq: Every evening | ORAL | 3 refills | Status: DC | PRN

## 2023-10-01 MED ORDER — IBUPROFEN 800 MG PO TABS: 800.0000 mg | ORAL_TABLET | Freq: Three times a day (TID) | ORAL | 0 refills | Status: DC | PRN

## 2023-10-01 MED ORDER — ACETAMINOPHEN 325 MG PO TABS
650.0000 mg | ORAL_TABLET | ORAL | Status: DC | PRN
  Administered 2023-10-01: 650 mg via ORAL
  Filled 2023-10-01: qty 2

## 2023-10-01 MED ORDER — PRENATAL MULTIVITAMIN CH
1.0000 | ORAL_TABLET | Freq: Every day | ORAL | Status: DC
  Administered 2023-10-02 – 2023-10-03 (×2): 1 via ORAL
  Filled 2023-10-01 (×2): qty 1

## 2023-10-01 MED ORDER — DIBUCAINE (PERIANAL) 1 % EX OINT: 1.0000 | TOPICAL_OINTMENT | CUTANEOUS | Status: DC | PRN

## 2023-10-01 MED ORDER — DIPHENHYDRAMINE HCL 25 MG PO CAPS: 25.0000 mg | ORAL_CAPSULE | Freq: Four times a day (QID) | ORAL | Status: DC | PRN

## 2023-10-01 NOTE — Progress Notes (Signed)
Patient wanted to talk about feasibility of being transferred to [facility] to be with her infant who will be transferred later tonight or tomorrow morning.  She was informed she can be discharged to go with her infant, no postpartum transfer is needed given that there are no concerning medical issues needing inpatient care.  Prescriptions were sent to pharmacy that she desired, and she will be able to go whenever she desires.  Support given to patient and her family.   Jaynie Collins, MD

## 2023-10-01 NOTE — Progress Notes (Addendum)
Pamela Klein is a 33 y.o. G2P1001 at [redacted]w[redacted]d admitted for IOL for A1GDM  Subjective: Pt comfortable with epidural. Support persons in room.   Objective: BP (!) 116/59   Pulse 95   Temp 98 F (36.7 C) (Oral)   Resp 18   Ht 5\' 4"  (1.626 m)   Wt 100.5 kg   LMP 12/29/2022 (Exact Date)   SpO2 98%   BMI 38.04 kg/m  No intake/output data recorded. No intake/output data recorded.  FHT:  FHR: 135 bpm, variability: moderate,  accelerations:  Present,  decelerations:  Present repetitive variables UC:   irregular, every 2-5 minutes, MVUs 220   SVE:   Dilation: 5 Effacement (%): 80 Station: -1 Exam by:: Misty Stanley, CNM ROP position noted on exam    Labs: Lab Results  Component Value Date   WBC 8.4 09/30/2023   HGB 11.9 (L) 09/30/2023   HCT 35.7 (L) 09/30/2023   MCV 86.4 09/30/2023   PLT 188 09/30/2023    Assessment / Plan: IOL for A1GDM, with slow progress in early labor  Labor:  No cervical change in several hours but inadequate contractions and now confirmed fetal malposition, ROP.  Maternal positions to encourage fetal rotation, starting with exaggerated right lateral position.   Preeclampsia:   n/a Fetal Wellbeing:  Category II Pain Control:  Epidural I/D:   GBS neg Anticipated MOD:  NSVD  Sharen Counter, CNM 10/01/2023, 12:40 AM

## 2023-10-01 NOTE — Progress Notes (Signed)
Pamela Klein is a 33 y.o. G2P1001 at [redacted]w[redacted]d admitted for induction of labor due to A1GDM.  Subjective: Pt comfortable with epidural, starting to feel pressure in low abdomen, denies any rectal or vaginal pressure.   Objective: BP 136/85   Pulse 90   Temp 98 F (36.7 C) (Oral)   Resp 18   Ht 5\' 4"  (1.626 m)   Wt 100.5 kg   LMP 12/29/2022 (Exact Date)   SpO2 100%   BMI 38.04 kg/m  I/O last 3 completed shifts: In: -  Out: 850 [Urine:850] No intake/output data recorded.  FHT:  FHR: 135 bpm, variability: moderate,  accelerations:  Present,  decelerations:  Present periodic variables UC:   irregular, every 2-4 minutes SVE:   Deferred  Labs: Lab Results  Component Value Date   WBC 8.4 09/30/2023   HGB 11.9 (L) 09/30/2023   HCT 35.7 (L) 09/30/2023   MCV 86.4 09/30/2023   PLT 188 09/30/2023    Assessment / Plan: Augmentation of labor, progressing well  Labor: Progressing normally  Deferred cervical exam at this time with shared decision making.  Preeclampsia:   n/a Fetal Wellbeing:  Category II Pain Control:  Epidural I/D:   GBS neg Anticipated MOD:  NSVD  Sharen Counter, CNM 10/01/2023, 9:03 AM

## 2023-10-01 NOTE — Progress Notes (Signed)
Pamela Klein is a 33 y.o. G2P1001 at [redacted]w[redacted]d admitted for induction of labor due to A1GDM.   Subjective: Pt comfortable with epidural. Support persons in room.   Objective: BP 125/82   Pulse 98   Temp 98 F (36.7 C) (Oral)   Resp 16   Ht 5\' 4"  (1.626 m)   Wt 100.5 kg   LMP 12/29/2022 (Exact Date)   SpO2 99%   BMI 38.04 kg/m  No intake/output data recorded. Total I/O In: -  Out: 850 [Urine:850]  FHT:  FHR: 135 bpm, variability: moderate,  accelerations:  Present,  decelerations:  Present periodic variables UC:   regular, every 2-3 minutes SVE:   Dilation: 6.5 Effacement (%): 90 Station: 0 Exam by:: Misty Stanley, CNM DOA position noted on exam  Labs: Lab Results  Component Value Date   WBC 8.4 09/30/2023   HGB 11.9 (L) 09/30/2023   HCT 35.7 (L) 09/30/2023   MCV 86.4 09/30/2023   PLT 188 09/30/2023    Assessment / Plan: Induction of labor due to A1GDM,  progressing well on pitocin  Labor: Progressing normally. Pt with slow labor progress from 5-6.5 cm but OP position noted.  With more favorable OA position noted on this exam, and more regular contraction pattern, expect more significant progress before next exam.   Preeclampsia:   n/a Fetal Wellbeing:  Category II. Overall Category I but with intermittent variables Pain Control:  Epidural I/D:   GBS neg Anticipated MOD:  NSVD  Sharen Counter, CNM 10/01/2023, 6:08 AM

## 2023-10-01 NOTE — Discharge Summary (Signed)
Postpartum Discharge Summary  Date of Service updated***     Patient Name: Pamela Klein DOB: August 22, 1991 MRN: [id number]  Date of admission: 09/30/2023 Delivery date:10/01/2023 Delivering provider: Edd Arbour R Date of discharge: 10/01/2023  Admitting diagnosis: Gestational diabetes mellitus [O24.419] Intrauterine pregnancy: [redacted]w[redacted]d     Secondary diagnosis:  Principal Problem:   Gestational diabetes mellitus  Additional problems: ***    Discharge diagnosis: Term Pregnancy Delivered and GDM A1                                              Postpartum procedures:{Postpartum procedures:23558} Augmentation: AROM, Pitocin, and OP Foley Complications: None  Hospital course: Induction of Labor With Vaginal Delivery   33 y.o. yo G2P1001 at [redacted]w[redacted]d was admitted to the hospital 09/30/2023 for induction of labor.  Indication for induction: A1 DM.  Patient had an labor course complicated by a prolonged active stage followed by baby's admission to NICU. Membrane Rupture Time/Date: 2:07 AM,09/30/2023  Delivery Method:Vaginal, Spontaneous Operative Delivery:N/A Episiotomy: None Lacerations:  1st degree;Perineal Details of delivery can be found in separate delivery note.  Patient had a postpartum course complicated by***. Patient is discharged home 10/01/23.  Newborn Data: Birth date:10/01/2023 Birth time:12:27 PM Gender:Female Living status:Living Apgars:4 ,  Weight:7 lb 10.4 oz (3.47 kg)  Magnesium Sulfate received: No BMZ received: No Rhophylac:N/A MMR:N/A T-DaP:Given prenatally Flu: No RSV Vaccine received: Yes Transfusion:{Transfusion received:30440034}  Immunizations received: Immunization History  Administered Date(s) Administered   MMR 02/29/2012   Pneumococcal Polysaccharide-23 05/02/2013   Rsv, Bivalent, Protein Subunit Rsvpref,pf Verdis Frederickson) 09/05/2023   Tdap 02/28/2012, 09/05/2023   Physical exam  Vitals:   10/01/23 1301 10/01/23 1316 10/01/23 1321 10/01/23 1332   BP: 139/85 (!) 146/93 127/89 122/82  Pulse: 85 90 85 87  Resp: 20 16 16 16   Temp:      TempSrc:      SpO2:      Weight:      Height:       General: {Exam; general:21111117} Lochia: {Desc; appropriate/inappropriate:30686::"appropriate"} Uterine Fundus: {Desc; firm/soft:30687} Incision: {Exam; incision:21111123} DVT Evaluation: {Exam; dvt:2111122} Labs: Lab Results  Component Value Date   WBC 8.4 09/30/2023   HGB 11.9 (L) 09/30/2023   HCT 35.7 (L) 09/30/2023   MCV 86.4 09/30/2023   PLT 188 09/30/2023      Latest Ref Rng & Units 09/08/2023    8:41 AM  CMP  Glucose 70 - 99 mg/dL 98   BUN 6 - 20 mg/dL <5   Creatinine 9.56 - 1.00 mg/dL 2.13   Sodium 086 - 578 mmol/L 136   Potassium 3.5 - 5.1 mmol/L 4.0   Chloride 98 - 111 mmol/L 105   CO2 22 - 32 mmol/L 23   Calcium 8.9 - 10.3 mg/dL 8.7   Total Protein 6.5 - 8.1 g/dL 5.9   Total Bilirubin 0.0 - 1.2 mg/dL 0.3   Alkaline Phos 38 - 126 U/L 92   AST 15 - 41 U/L 14   ALT 0 - 44 U/L 16    Edinburgh Score:     No data to display         No data recorded  After visit meds:  Allergies as of 10/01/2023   No Known Allergies   Med Rec must be completed prior to using this [facility]***     Discharge home in stable condition Infant  Feeding: {Baby feeding:23562} Infant Disposition:{CHL IP OB HOME WITH ZOXWRU:04540} Discharge instruction: per After Visit Summary and Postpartum booklet. Activity: Advance as tolerated. Pelvic rest for 6 weeks.  Diet: {OB JWJX:91478295}  Future Appointments: Future Appointments  Date Time Provider Department Center  11/10/2023  2:10 PM Levie Heritage, DO [facility] None   Follow up Visit: Message sent for GTT scheduling, postpartum visit scheduled  Please schedule this patient for a In person postpartum visit in 4 weeks with the following provider: MD. Additional Postpartum F/U:2 hour GTT  High risk pregnancy complicated by: GDM Delivery mode:  Vaginal, Spontaneous Anticipated Birth  Control:  Unsure   10/01/2023 Bernerd Limbo, CNM

## 2023-10-01 NOTE — Lactation Note (Addendum)
This note was copied from a baby's chart.  NICU Lactation Consultation Note  Patient Name: Pamela Klein [id number] Date: 10/01/2023 Age:33 hours  Reason for consult: Initial assessment; NICU baby; Mother's request; Term; Maternal endocrine disorder Type of Endocrine Disorder?: Diabetes (A1DM diet controlled)  SUBJECTIVE  L&D RN called LC to ask for Mom to be set up with a DEBP soon after baby's birth.  LC in to visit with P2 Mom of term baby delivered vaginally and admitted to NICU for respiratory support.  Mom is an experienced breastfeeding Mom of a year with her now 19 yr old.  Mom desires to direct breastfeed her baby.  RN just finished setting up the pump and Mom had just started pumping.   LC offered to assist and Mom agreeable.    LC provided Mom with a pumping top and assisted her to pump using 21 mm flanges.  Mom instructed to use the initiation setting until volume expressed >20 ml. Mom instructed to then switch to maintain mode.  Reviewed breast massage and hand expression and encouraged Mom to do this after pumping.  Mom encouraged to pump every 2-3 hrs between naps.  Mom educated that the goal is to pump 8 times per 24 hrs.  Mom provided with a washing and a drying bin, labeled for pump parts.  Mom provided with CDC guidelines for cleaning pump parts.   OBJECTIVE Infant data: No data recorded O2 Device: Ventilator FiO2 (%): 100 %  Infant feeding assessment No data recorded  Maternal data: G2P2002 Vaginal, Spontaneous Has patient been taught Hand Expression?: Yes Hand Expression Comments: unable to express drops presently Significant Breast History:: ++ breast changes Current breast feeding challenges:: infant separation Does the patient have breastfeeding experience prior to this delivery?: Yes How long did the patient breastfeed?: 1 year Pumping frequency: Initiated bilateral pumping at 3 hrs post birth Flange Size: 21 Hands-free pumping top sizes: Large  Wallace Cullens) Risk factor for low/delayed milk supply:: infant separation  Pump: Hands Free, Personal (Mom[employer] through insurance)  ASSESSMENT Infant:  Feeding Status: NPO  Maternal: No data recorded INTERVENTIONS/PLAN Interventions: Interventions: Breast feeding basics reviewed; Skin to skin; Breast massage; Hand express; DEBP; LC Services brochure; NICU Pumping Log; CDC Guidelines for Breast Pump Cleaning Tools: Pump; Flanges; Hands-free pumping top Pump Education: Setup, frequency, and cleaning; Milk Storage  Plan: 1- STS with baby when able to 2- breast massage and hand expression 3-Pump both breasts on initiation setting for 15 mins 4- ask for LC prn  Consult Status: NICU follow-up NICU Follow-up type: New admission follow up   Judee Clara RN IBCLC 10/01/2023, 3:41 PM

## 2023-10-01 NOTE — Anesthesia Postprocedure Evaluation (Signed)
Anesthesia Post Note  Patient: Pamela Klein  Procedure(s) Performed: AN AD HOC LABOR EPIDURAL     Patient location during evaluation: Mother Baby Anesthesia Type: Epidural Level of consciousness: awake and alert Pain management: pain level controlled Vital Signs Assessment: post-procedure vital signs reviewed and stable Respiratory status: spontaneous breathing, nonlabored ventilation and respiratory function stable Cardiovascular status: stable Postop Assessment: no headache, no backache and epidural receding Anesthetic complications: no   No notable events documented.  Last Vitals:  Vitals:   10/01/23 1443 10/01/23 1545  BP: 135/79 133/82  Pulse: 84 80  Resp: 18 18  Temp: 36.8 C 36.8 C  SpO2: 100% 100%    Last Pain:  Vitals:   10/01/23 1545  TempSrc: Oral  PainSc:    Pain Goal:                   Jessiah Steinhart

## 2023-10-01 NOTE — Progress Notes (Addendum)
Patient ID: Pamela Klein, female   DOB: [birth year], 33 y.o.   MRN: [id number]  Labor Progress Note Pamela Klein is a 33 y.o. G2P1001 at [redacted]w[redacted]d presented for A1GDM IOL w marginal cord insertion, history of gHTN in a prior pregnancy.  S:  Called to room for labor check, pt has been changing positions with the help of her doula, Pamela Klein. FOB at bedside for support. Comfortable with epidural  O:  BP 127/88   Pulse 99   Temp 98 F (36.7 C) (Oral)   Resp 18   Ht 5\' 4"  (1.626 m)   Wt 221 lb 9.6 oz (100.5 kg)   LMP 12/29/2022 (Exact Date)   SpO2 100%   BMI 38.04 kg/m  EFM: baseline 130 bpm/ moderate variability/ 15x15 accels/ variable decels with position changes, recovers well Toco/IUPC: q41min SVE: Dilation: 6.5 Effacement (%): 90 Cervical Position: Posterior Station: 0 Presentation: Vertex Exam by:: Pamela Klein, CNM Pitocin: 26 mu/min  A/P: 33 y.o. G2P1001 [redacted]w[redacted]d  1. Labor: Cervix largely unchanged, but is very thin except for some puffiness to the anterior side. Continue position changes, IUPC replaced. 2. FWB: Cat 1, light meconium-stained fluid noted when changing IUPC. 3. Pain: Well-controlled with epidural 4. A1GDM: last glu=108  Anticipate SVD.  Pamela Klein, CNM 10:02 AM

## 2023-10-02 LAB — CBC
HCT: 30.9 % — ABNORMAL LOW (ref 36.0–46.0)
Hemoglobin: 10.3 g/dL — ABNORMAL LOW (ref 12.0–15.0)
MCH: 29.4 pg (ref 26.0–34.0)
MCHC: 33.3 g/dL (ref 30.0–36.0)
MCV: 88.3 fL (ref 80.0–100.0)
Platelets: 174 10*3/uL (ref 150–400)
RBC: 3.5 MIL/uL — ABNORMAL LOW (ref 3.87–5.11)
RDW: 14.9 % (ref 11.5–15.5)
WBC: 17.9 10*3/uL — ABNORMAL HIGH (ref 4.0–10.5)
nRBC: 0 % (ref 0.0–0.2)

## 2023-10-02 NOTE — Lactation Note (Signed)
This note was copied from a baby's chart.  NICU Lactation Consultation Note  Patient Name: Pamela Klein [id number] Date: 10/02/2023 Age:33 hours  Reason for consult: Follow-up assessment; NICU baby; Term; Maternal endocrine disorder Type of Endocrine Disorder?: Diabetes (A1GDM)  SUBJECTIVE Visited with family of 55 hours old FT NICU female; [name] is a P2 and reported she's been pumping but "nothing comes out". Explained that the purpose of pumping this early on is mainly for breast stimulation and not to get volume, she voiced understanding. She also voiced that her daughter (she's now 71 y.o) had a NICU stay for a few weeks. She's familiar with pumping, revised pumping schedule, pump settings, lactogenesis II and anticipatory guidelines. Baby "Pamela Klein" will be starting NG feeds today; still under observation as transfer to [facility] for abnormal newborn cardiac screening was postponed.   OBJECTIVE Infant data: Mother's Current Feeding Choice: -- (NPO)  O2 Device: Room Air FiO2 (%): 21 %  Maternal data: G2P2002 Vaginal, Spontaneous Has patient been taught Hand Expression?: Yes Hand Expression Comments: unable to express drops presently Significant Breast History:: ++ breast changes Current breast feeding challenges:: infant separation Does the patient have breastfeeding experience prior to this delivery?: Yes How long did the patient breastfeed?: 1 year Pumping frequency: 3-4 times/24 hours Pumped volume: 0 mL Flange Size: 21 Hands-free pumping top sizes: Large Wallace Cullens) Risk factor for low/delayed milk supply:: infant separation  Pump: Hands Free, Personal (MomCozy through insurance)  ASSESSMENT Infant: Feeding Status: NPO  Maternal: Milk volume: Normal  INTERVENTIONS/PLAN Interventions: Interventions: Breast feeding basics reviewed; DEBP; Education Tools: Pump; Flanges; Hands-free pumping top Pump Education: Setup, frequency, and cleaning; Milk  Storage  Plan: Encouraged pumping every 3 hours; ideally 8 pumping sessions/24 hours Breast massage and hand expression were also encouraged STS whenever possible  MOB's cousin present and supportive. All questions and concerns answered, family to contact [facility] services PRN.  Consult Status: NICU follow-up NICU Follow-up type: Maternal D/C visit   Mekaylah Klich Venetia Constable 10/02/2023, 6:11 PM

## 2023-10-02 NOTE — Plan of Care (Signed)
  Problem: Education: Goal: Knowledge of General Education information will improve Description: Including pain rating scale, medication(s)/side effects and non-pharmacologic comfort measures Outcome: Progressing   Problem: Health Behavior/Discharge Planning: Goal: Ability to manage health-related needs will improve Outcome: Progressing   Problem: Clinical Measurements: Goal: Ability to maintain clinical measurements within normal limits will improve Outcome: Progressing   Problem: Coping: Goal: Level of anxiety will decrease Outcome: Progressing   Problem: Activity: Goal: Ability to tolerate increased activity will improve Outcome: Progressing   Problem: Coping: Goal: Ability to identify and utilize available resources and services will improve Outcome: Progressing   Problem: Life Cycle: Goal: Chance of risk for complications during the postpartum period will decrease Outcome: Progressing

## 2023-10-02 NOTE — Progress Notes (Signed)
Patient not being discharged today as baby has not been transferred to [facility].

## 2023-10-02 NOTE — Plan of Care (Signed)

## 2023-10-02 NOTE — Progress Notes (Signed)
Post Partum Day 1 s/p SVD after IOL for GDM at [redacted]w[redacted]d  Subjective: No complaints, up ad lib, voiding, and tolerating PO.  Baby is in NICU due to complications, they are considering transfer of baby to [facility].  Objective: Physical exam        Vitals:    10/01/23 1545 10/01/23 1951 10/01/23 2300 10/02/23 0616  BP: 133/82 137/77 121/68 128/75  Pulse: 80 60 86 76  Resp: 18 18 18 18   Temp: 98.2 F (36.8 C) 97.9 F (36.6 C) 98.3 F (36.8 C) 97.7 F (36.5 C)  TempSrc: Oral Oral Oral Oral  SpO2: 100% 100% 100% 100%  Weight:          Height:            General: alert, cooperative, and no distress Lochia: appropriate Uterine Fundus: firm DVT Evaluation: No evidence of DVT seen on physical exam. Negative Homan's sign.No cords or calf tenderness. No significant calf/ankle edema.  Recent Labs    09/30/23 0032 10/02/23 0336  HGB 11.9* 10.3*  HCT 35.7* 30.9*    Assessment/Plan: Breastfeeding, will get lactation consult No other postpartum complications Continue routine postpartum care. Likely discharge today if baby is transferred to [facility], or tomorrow.   LOS: 2 days   Jaynie Collins, MD, FACOG Obstetrician & Gynecologist, [facility] for [employer], [facility]

## 2023-10-03 ENCOUNTER — Other Ambulatory Visit (HOSPITAL_COMMUNITY): Payer: Self-pay

## 2023-10-03 MED ORDER — NIFEDIPINE ER OSMOTIC RELEASE 30 MG PO TB24: 30.0000 mg | ORAL_TABLET | Freq: Every day | ORAL | Status: DC

## 2023-10-03 MED ORDER — FUROSEMIDE 20 MG PO TABS: 20.0000 mg | ORAL_TABLET | Freq: Every day | ORAL | 0 refills | Status: DC

## 2023-10-03 MED ORDER — FUROSEMIDE 20 MG PO TABS
20.0000 mg | ORAL_TABLET | Freq: Every day | ORAL | Status: DC
  Administered 2023-10-03: 20 mg via ORAL
  Filled 2023-10-03: qty 1

## 2023-10-03 MED ORDER — POTASSIUM CHLORIDE CRYS ER 20 MEQ PO TBCR: 20.0000 meq | EXTENDED_RELEASE_TABLET | Freq: Every day | ORAL | 0 refills | Status: DC

## 2023-10-03 MED ORDER — POTASSIUM CHLORIDE CRYS ER 20 MEQ PO TBCR
20.0000 meq | EXTENDED_RELEASE_TABLET | Freq: Every day | ORAL | Status: DC
  Administered 2023-10-03: 20 meq via ORAL
  Filled 2023-10-03: qty 1

## 2023-10-03 NOTE — Progress Notes (Signed)
Patient given discharge AVS and reviewed, Follow-up appointment, medications, PP care, breast care, Pre-eclampsia, and PP depression s/s reviewed with patient and she verbalizes understanding.Patient states that she has her Baby scripts app and has a blood pressure cuff at home that she has used prenatally. Patient discharged ambulatory to home.

## 2023-10-03 NOTE — Progress Notes (Signed)
Patient screened out for psychosocial assessment since none of the following apply:  Psychosocial stressors documented in mother or baby's chart  Gestation less than 32 weeks  Code at delivery   Infant with anomalies Please contact the Clinical Social Worker if specific needs arise, by MOB's request, or if MOB scores greater than 9/yes to question 10 on Edinburgh Postpartum Depression Screen.  Celso Sickle, LCSW Clinical Social Worker [facility] Cell#: [phone]

## 2023-10-03 NOTE — Lactation Note (Signed)
This note was copied from a baby's chart.  NICU Lactation Consultation Note  Patient Name: Pamela Klein [id number] Date: 10/03/2023 Age:33 hours  Reason for consult: Follow-up assessment; NICU baby; Term; Maternal endocrine disorder; Maternal discharge Type of Endocrine Disorder?: Diabetes (GDM- diet controlled)  SUBJECTIVE  LC in to visit with P2 Mom on day of her discharge from [facility].  Mom feeling good and states she is pumping consistently, but no milk to collect yet.  Encouraged her to continue her consistent pumping and holding baby STS and breast massage and hand expression.  Mom aware of lactation support available to her while baby is in the NICU.  OBJECTIVE Infant data: Mother's Current Feeding Choice: -- (NPO)  O2 Device: Room Air FiO2 (%): 21 %  Infant feeding assessment IDFTS - Readiness: 2 (hunger cues present but echo needed to be performed)   Maternal data: G2P2002 Vaginal, Spontaneous Pumping frequency: 8 times per 24 hrs Pumped volume: 0 mL Flange Size: 21 Hands-free pumping top sizes: Large Wallace Cullens)  WIC Program: No WIC Referral Sent?: No Pump: Hands Free, Personal (MomCozy)  ASSESSMENT Infant:  Feeding Status: Scheduled 8-11-2-5 Feeding method: Tube/Gavage (Bolus) Nipple Type: Nfant Slow Flow (purple)  Maternal: Milk volume: Normal  INTERVENTIONS/PLAN Interventions: Interventions: Breast feeding basics reviewed; Skin to skin; Breast massage; Hand express; DEBP; Education Discharge Education: Engorgement and breast care Tools: Pump; Flanges; Bottle; Hands-free pumping top Pump Education: Setup, frequency, and cleaning; Milk Storage  Plan: Consult Status: NICU follow-up NICU Follow-up type: Verify onset of copious milk; Verify absence of engorgement   Judee Clara 10/03/2023, 10:33 AM

## 2023-10-03 NOTE — Plan of Care (Signed)
  Problem: Education: Goal: Knowledge of General Education information will improve Description: Including pain rating scale, medication(s)/side effects and non-pharmacologic comfort measures Outcome: Adequate for Discharge   Problem: Health Behavior/Discharge Planning: Goal: Ability to manage health-related needs will improve Outcome: Adequate for Discharge   Problem: Clinical Measurements: Goal: Ability to maintain clinical measurements within normal limits will improve Outcome: Adequate for Discharge Goal: Will remain free from infection Outcome: Adequate for Discharge Goal: Diagnostic test results will improve Outcome: Adequate for Discharge Goal: Respiratory complications will improve Outcome: Adequate for Discharge Goal: Cardiovascular complication will be avoided Outcome: Adequate for Discharge   Problem: Activity: Goal: Risk for activity intolerance will decrease Outcome: Adequate for Discharge   Problem: Nutrition: Goal: Adequate nutrition will be maintained Outcome: Adequate for Discharge   Problem: Coping: Goal: Level of anxiety will decrease Outcome: Adequate for Discharge   Problem: Elimination: Goal: Will not experience complications related to bowel motility Outcome: Adequate for Discharge Goal: Will not experience complications related to urinary retention Outcome: Adequate for Discharge   Problem: Pain Managment: Goal: General experience of comfort will improve and/or be controlled Outcome: Adequate for Discharge   Problem: Safety: Goal: Ability to remain free from injury will improve Outcome: Adequate for Discharge   Problem: Skin Integrity: Goal: Risk for impaired skin integrity will decrease Outcome: Adequate for Discharge   Problem: Education: Goal: Knowledge of condition will improve Outcome: Adequate for Discharge Goal: Individualized Educational Video(s) Outcome: Adequate for Discharge Goal: Individualized Newborn Educational  Video(s) Outcome: Adequate for Discharge   Problem: Activity: Goal: Will verbalize the importance of balancing activity with adequate rest periods Outcome: Adequate for Discharge Goal: Ability to tolerate increased activity will improve Outcome: Adequate for Discharge   Problem: Coping: Goal: Ability to identify and utilize available resources and services will improve Outcome: Adequate for Discharge   Problem: Life Cycle: Goal: Chance of risk for complications during the postpartum period will decrease Outcome: Adequate for Discharge   Problem: Role Relationship: Goal: Ability to demonstrate positive interaction with newborn will improve Outcome: Adequate for Discharge   Problem: Skin Integrity: Goal: Demonstration of wound healing without infection will improve Outcome: Adequate for Discharge

## 2023-10-04 ENCOUNTER — Telehealth: Payer: Self-pay

## 2023-10-04 ENCOUNTER — Other Ambulatory Visit: Payer: Self-pay

## 2023-10-04 DIAGNOSIS — F53 Postpartum depression: Secondary | ICD-10-CM

## 2023-10-04 NOTE — Telephone Encounter (Signed)
-----   Message from Lamar Anyanwu sent at 10/02/2023  6:42 AM EST ----- Regarding: Needs mood check - can be phone call later this week Patient had delivery complicated by her baby having respiratory issues, baby had to be transferred to [facility] for specialized care.  Please call to check in on her sometime this week.  I doubt Dr/Stinson has room in his schedule, but if he does, she can be scheduled for a virtual visit with him.  However, a phone call should also suffice.  Thank you and have a great week!   Jaynie Collins, MD

## 2023-10-04 NOTE — Telephone Encounter (Signed)
Spoke with patient.  She is doing well.  Baby remains in NICU at [facility], never transferred to [facility].  Improving daily.  Danna Hefty

## 2023-10-05 ENCOUNTER — Inpatient Hospital Stay (HOSPITAL_COMMUNITY)
Admission: AD | Admit: 2023-10-05 | Discharge: 2023-10-06 | DRG: 776 | Disposition: A | Discharge status: Home / Self Care | Payer: Managed Care, Other (non HMO) | Attending: Internal Medicine | Admitting: Internal Medicine

## 2023-10-05 ENCOUNTER — Other Ambulatory Visit: Payer: Self-pay

## 2023-10-05 ENCOUNTER — Encounter: Payer: 59 | Admitting: Family Medicine

## 2023-10-05 ENCOUNTER — Inpatient Hospital Stay (HOSPITAL_COMMUNITY): Payer: Managed Care, Other (non HMO)

## 2023-10-05 ENCOUNTER — Encounter (HOSPITAL_COMMUNITY): Payer: Self-pay | Admitting: Obstetrics & Gynecology

## 2023-10-05 DIAGNOSIS — O9953 Diseases of the respiratory system complicating the puerperium: Principal | ICD-10-CM | POA: Diagnosis present

## 2023-10-05 DIAGNOSIS — O1495 Unspecified pre-eclampsia, complicating the puerperium: Secondary | ICD-10-CM

## 2023-10-05 DIAGNOSIS — Z87891 Personal history of nicotine dependence: Secondary | ICD-10-CM | POA: Diagnosis not present

## 2023-10-05 DIAGNOSIS — Z1152 Encounter for screening for COVID-19: Secondary | ICD-10-CM

## 2023-10-05 DIAGNOSIS — Z8759 Personal history of other complications of pregnancy, childbirth and the puerperium: Secondary | ICD-10-CM

## 2023-10-05 DIAGNOSIS — I5021 Acute systolic (congestive) heart failure: Secondary | ICD-10-CM | POA: Diagnosis not present

## 2023-10-05 DIAGNOSIS — M7989 Other specified soft tissue disorders: Secondary | ICD-10-CM | POA: Diagnosis not present

## 2023-10-05 DIAGNOSIS — R0602 Shortness of breath: Secondary | ICD-10-CM | POA: Diagnosis present

## 2023-10-05 DIAGNOSIS — J189 Pneumonia, unspecified organism: Principal | ICD-10-CM

## 2023-10-05 DIAGNOSIS — Z79899 Other long term (current) drug therapy: Secondary | ICD-10-CM | POA: Diagnosis not present

## 2023-10-05 DIAGNOSIS — O9952 Diseases of the respiratory system complicating childbirth: Secondary | ICD-10-CM | POA: Diagnosis present

## 2023-10-05 DIAGNOSIS — J180 Bronchopneumonia, unspecified organism: Secondary | ICD-10-CM | POA: Diagnosis present

## 2023-10-05 DIAGNOSIS — J9601 Acute respiratory failure with hypoxia: Secondary | ICD-10-CM | POA: Diagnosis present

## 2023-10-05 DIAGNOSIS — Z7982 Long term (current) use of aspirin: Secondary | ICD-10-CM

## 2023-10-05 DIAGNOSIS — O165 Unspecified maternal hypertension, complicating the puerperium: Secondary | ICD-10-CM

## 2023-10-05 LAB — ECHOCARDIOGRAM COMPLETE
AR max vel: 2.35 cm2
AV Peak grad: 3.2 mm[Hg]
Ao pk vel: 0.9 m/s
Area-P 1/2: 4.49 cm2
S' Lateral: 2.5 cm

## 2023-10-05 LAB — RESPIRATORY PANEL BY PCR

## 2023-10-05 LAB — COMPREHENSIVE METABOLIC PANEL
ALT: 26 U/L (ref 0–44)
AST: 19 U/L (ref 15–41)
Albumin: 2.4 g/dL — ABNORMAL LOW (ref 3.5–5.0)
Alkaline Phosphatase: 98 U/L (ref 38–126)
Anion gap: 10 (ref 5–15)
BUN: 10 mg/dL (ref 6–20)
CO2: 26 mmol/L (ref 22–32)
Calcium: 8.6 mg/dL — ABNORMAL LOW (ref 8.9–10.3)
Chloride: 104 mmol/L (ref 98–111)
Creatinine, Ser: 0.55 mg/dL (ref 0.44–1.00)
GFR, Estimated: >60 mL/min (ref >60)
Glucose, Bld: 95 mg/dL (ref 70–99)
Potassium: 4 mmol/L (ref 3.5–5.1)
Sodium: 140 mmol/L (ref 135–145)
Total Bilirubin: 0.3 mg/dL (ref 0.0–1.2)
Total Protein: 5.5 g/dL — ABNORMAL LOW (ref 6.5–8.1)

## 2023-10-05 LAB — CBC WITH DIFFERENTIAL/PLATELET
Abs Immature Granulocytes: 0.11 10*3/uL — ABNORMAL HIGH (ref 0.00–0.07)
Basophils Absolute: 0 10*3/uL (ref 0.0–0.1)
Basophils Relative: 0 %
Eosinophils Absolute: 0.1 10*3/uL (ref 0.0–0.5)
Eosinophils Relative: 1 %
HCT: 34.6 % — ABNORMAL LOW (ref 36.0–46.0)
Hemoglobin: 11.4 g/dL — ABNORMAL LOW (ref 12.0–15.0)
Immature Granulocytes: 1 %
Lymphocytes Relative: 17 %
Lymphs Abs: 1.6 10*3/uL (ref 0.7–4.0)
MCH: 28.6 pg (ref 26.0–34.0)
MCHC: 32.9 g/dL (ref 30.0–36.0)
MCV: 86.9 fL (ref 80.0–100.0)
Monocytes Absolute: 0.7 10*3/uL (ref 0.1–1.0)
Monocytes Relative: 7 %
Neutro Abs: 6.8 10*3/uL (ref 1.7–7.7)
Neutrophils Relative %: 74 %
Platelets: 248 10*3/uL (ref 150–400)
RBC: 3.98 MIL/uL (ref 3.87–5.11)
RDW: 14.4 % (ref 11.5–15.5)
WBC: 9.3 10*3/uL (ref 4.0–10.5)
nRBC: 0 % (ref 0.0–0.2)

## 2023-10-05 LAB — PROTEIN / CREATININE RATIO, URINE
Creatinine, Urine: 26 mg/dL
Total Protein, Urine: <6 mg/dL

## 2023-10-05 LAB — SARS CORONAVIRUS 2 BY RT PCR: SARS Coronavirus 2 by RT PCR: NEGATIVE

## 2023-10-05 LAB — C-REACTIVE PROTEIN: CRP: 5.9 mg/dL — ABNORMAL HIGH (ref <1.0)

## 2023-10-05 LAB — PHOSPHORUS: Phosphorus: 5.4 mg/dL — ABNORMAL HIGH (ref 2.5–4.6)

## 2023-10-05 LAB — D-DIMER, QUANTITATIVE: D-Dimer, Quant: 3.67 ug{FEU}/mL — ABNORMAL HIGH (ref 0.00–0.50)

## 2023-10-05 LAB — BRAIN NATRIURETIC PEPTIDE: B Natriuretic Peptide: 168.5 pg/mL — ABNORMAL HIGH (ref 0.0–100.0)

## 2023-10-05 LAB — TROPONIN I (HIGH SENSITIVITY)
Troponin I (High Sensitivity): 60 ng/L — ABNORMAL HIGH (ref <18)
Troponin I (High Sensitivity): 44 ng/L — ABNORMAL HIGH (ref <18)

## 2023-10-05 LAB — MAGNESIUM: Magnesium: 4.1 mg/dL — ABNORMAL HIGH (ref 1.7–2.4)

## 2023-10-05 LAB — SURGICAL PATHOLOGY

## 2023-10-05 MED ORDER — NIFEDIPINE ER OSMOTIC RELEASE 30 MG PO TB24
30.0000 mg | ORAL_TABLET | Freq: Every day | ORAL | Status: DC
  Administered 2023-10-05 – 2023-10-06 (×2): 30 mg via ORAL
  Filled 2023-10-05 (×2): qty 1

## 2023-10-05 MED ORDER — SODIUM CHLORIDE 0.9 % IV SOLN: INTRAVENOUS | Status: DC

## 2023-10-05 MED ORDER — MAGNESIUM SULFATE BOLUS VIA INFUSION
4.0000 g | Freq: Once | INTRAVENOUS | Status: AC
  Administered 2023-10-05: 4 g via INTRAVENOUS
  Filled 2023-10-05: qty 1000

## 2023-10-05 MED ORDER — SODIUM CHLORIDE 0.9 % IV SOLN
500.0000 mg | INTRAVENOUS | Status: DC
  Filled 2023-10-05: qty 5

## 2023-10-05 MED ORDER — IPRATROPIUM-ALBUTEROL 0.5-2.5 (3) MG/3ML IN SOLN
3.0000 mL | Freq: Once | RESPIRATORY_TRACT | Status: AC
  Administered 2023-10-05: 3 mL via RESPIRATORY_TRACT
  Filled 2023-10-05: qty 3

## 2023-10-05 MED ORDER — LABETALOL HCL 5 MG/ML IV SOLN: 40.0000 mg | INTRAVENOUS | Status: DC | PRN

## 2023-10-05 MED ORDER — ENOXAPARIN SODIUM 60 MG/0.6ML IJ SOSY
50.0000 mg | PREFILLED_SYRINGE | INTRAMUSCULAR | Status: DC
  Administered 2023-10-05 – 2023-10-06 (×2): 50 mg via SUBCUTANEOUS
  Filled 2023-10-05 (×3): qty 0.6

## 2023-10-05 MED ORDER — BENZOCAINE-MENTHOL 20-0.5 % EX AERO
1.0000 | INHALATION_SPRAY | Freq: Four times a day (QID) | CUTANEOUS | Status: DC | PRN
  Administered 2023-10-05: 1 via TOPICAL
  Filled 2023-10-05: qty 56

## 2023-10-05 MED ORDER — LABETALOL HCL 5 MG/ML IV SOLN: 80.0000 mg | INTRAVENOUS | Status: DC | PRN

## 2023-10-05 MED ORDER — MAGNESIUM SULFATE 40 GM/1000ML IV SOLN
2.0000 g/h | INTRAVENOUS | Status: DC
  Filled 2023-10-05: qty 1000

## 2023-10-05 MED ORDER — LABETALOL HCL 5 MG/ML IV SOLN
20.0000 mg | INTRAVENOUS | Status: DC | PRN
  Administered 2023-10-05: 20 mg via INTRAVENOUS
  Filled 2023-10-05: qty 4

## 2023-10-05 MED ORDER — SODIUM CHLORIDE 0.9% FLUSH
3.0000 mL | Freq: Two times a day (BID) | INTRAVENOUS | Status: DC
  Administered 2023-10-06: 3 mL via INTRAVENOUS

## 2023-10-05 MED ORDER — OXYCODONE HCL 5 MG PO TABS
5.0000 mg | ORAL_TABLET | ORAL | Status: DC | PRN
  Administered 2023-10-05: 5 mg via ORAL
  Filled 2023-10-05: qty 1

## 2023-10-05 MED ORDER — FUROSEMIDE 10 MG/ML IJ SOLN
40.0000 mg | Freq: Once | INTRAMUSCULAR | Status: AC
  Administered 2023-10-05: 40 mg via INTRAVENOUS
  Filled 2023-10-05: qty 4

## 2023-10-05 MED ORDER — LACTATED RINGERS IV SOLN
INTRAVENOUS | Status: DC
Start: 2023-10-05 — End: 2023-10-06

## 2023-10-05 MED ORDER — FUROSEMIDE 10 MG/ML IJ SOLN
40.0000 mg | Freq: Every day | INTRAMUSCULAR | Status: DC
  Administered 2023-10-06: 40 mg via INTRAVENOUS
  Filled 2023-10-05: qty 4

## 2023-10-05 MED ORDER — PRENATAL MULTIVITAMIN CH
1.0000 | ORAL_TABLET | Freq: Every day | ORAL | Status: DC
  Administered 2023-10-06: 1 via ORAL
  Filled 2023-10-05: qty 1

## 2023-10-05 MED ORDER — ACETAMINOPHEN 325 MG PO TABS
650.0000 mg | ORAL_TABLET | Freq: Four times a day (QID) | ORAL | Status: DC | PRN
  Administered 2023-10-05 – 2023-10-06 (×4): 650 mg via ORAL
  Filled 2023-10-05 (×4): qty 2

## 2023-10-05 MED ORDER — LABETALOL HCL 5 MG/ML IV SOLN: 20.0000 mg | INTRAVENOUS | Status: DC | PRN

## 2023-10-05 MED ORDER — GABAPENTIN 300 MG PO CAPS
300.0000 mg | ORAL_CAPSULE | Freq: Two times a day (BID) | ORAL | Status: DC
  Administered 2023-10-05 – 2023-10-06 (×2): 300 mg via ORAL
  Filled 2023-10-05 (×2): qty 1

## 2023-10-05 MED ORDER — HYDRALAZINE HCL 20 MG/ML IJ SOLN: 10.0000 mg | INTRAMUSCULAR | Status: DC | PRN

## 2023-10-05 MED ORDER — SODIUM CHLORIDE 0.9 % IV SOLN
500.0000 mg | INTRAVENOUS | Status: DC
  Administered 2023-10-06: 500 mg via INTRAVENOUS
  Filled 2023-10-05: qty 5

## 2023-10-05 MED ORDER — NIFEDIPINE 10 MG PO CAPS
10.0000 mg | ORAL_CAPSULE | Freq: Once | ORAL | Status: AC
  Administered 2023-10-05: 10 mg via ORAL
  Filled 2023-10-05: qty 1

## 2023-10-05 MED ORDER — COCONUT OIL OIL
1.0000 | TOPICAL_OIL | Status: DC | PRN
  Administered 2023-10-05: 1 via TOPICAL

## 2023-10-05 MED ORDER — POLYETHYLENE GLYCOL 3350 17 G PO PACK: 17.0000 g | PACK | Freq: Every day | ORAL | Status: DC | PRN

## 2023-10-05 MED ORDER — AZITHROMYCIN 500 MG IV SOLR
500.0000 mg | Freq: Once | INTRAVENOUS | Status: AC
  Administered 2023-10-05: 500 mg via INTRAVENOUS
  Filled 2023-10-05: qty 5

## 2023-10-05 MED ORDER — CYCLOBENZAPRINE HCL 5 MG PO TABS
10.0000 mg | ORAL_TABLET | Freq: Once | ORAL | Status: AC
  Administered 2023-10-05: 10 mg via ORAL
  Filled 2023-10-05: qty 2

## 2023-10-05 MED ORDER — HYDROXYZINE HCL 50 MG PO TABS: 25.0000 mg | ORAL_TABLET | Freq: Three times a day (TID) | ORAL | Status: DC | PRN

## 2023-10-05 MED ORDER — CEFTRIAXONE SODIUM 1 G IJ SOLR: 1.0000 g | Freq: Once | INTRAMUSCULAR | Status: DC

## 2023-10-05 MED ORDER — SODIUM CHLORIDE 0.9 % IV SOLN
1.0000 g | INTRAVENOUS | Status: DC
  Administered 2023-10-05: 1 g via INTRAVENOUS
  Filled 2023-10-05 (×2): qty 10

## 2023-10-05 MED ORDER — IBUPROFEN 800 MG PO TABS
800.0000 mg | ORAL_TABLET | Freq: Once | ORAL | Status: AC
  Administered 2023-10-05: 800 mg via ORAL
  Filled 2023-10-05: qty 1

## 2023-10-05 MED ORDER — SENNOSIDES-DOCUSATE SODIUM 8.6-50 MG PO TABS
2.0000 | ORAL_TABLET | Freq: Two times a day (BID) | ORAL | Status: DC
  Administered 2023-10-05 – 2023-10-06 (×2): 2 via ORAL
  Filled 2023-10-05 (×2): qty 2

## 2023-10-05 MED ORDER — ACETAMINOPHEN 650 MG RE SUPP
650.0000 mg | Freq: Four times a day (QID) | RECTAL | Status: DC | PRN
Start: 2023-10-05 — End: 2023-10-06

## 2023-10-05 NOTE — Consult Note (Signed)
OB/GYN Consult Note  Referring Provider: Nolberto Hanlon, MD  Pamela Klein is a 33 y.o. [phone] admitted for community acquired pneumonia with potential superimposed postpartum preeclampsia. OB  consulted for management of preeclampsia.   Spoke with patient.  She was discharged on 10/03/23 after vaginal delivery.  Pt had a few elevated blood pressures before delivery.  She was discharged home on lasix, but no blood pressure medication.  Pt noted coughing and shortness of breath at around 1 AM.  Coughing was intensive enough that patient called EMS.  During her evaluation pt was found to be hypertensive in the severe range.  There was concern for postpartum preeclampsia so she is being treated for this condition as well as possible pneumonia.      Past Medical History:  Diagnosis Date   Cannabis abuse 05/01/2013   Chlamydia    Chronic back pain    Community acquired pneumonia 05/01/2013   H/O varicella    Sepsis (HCC) 05/01/2013   Vitamin D deficiency 11/28/2017    Past Surgical History:  Procedure Laterality Date   NO PAST SURGERIES      OB History  Gravida Para Term Preterm AB Living  2 2 2  0 0 2  SAB IAB Ectopic Multiple Live Births  0 0 0 0 2    # Outcome Date GA Lbr Len/2nd Weight Sex Type Anes PTL Lv  2 Term 10/01/23 [redacted]w[redacted]d 21:36 / 00:06 3470 g M Vag-Spont EPI  LIV  1 Term 02/27/12 [redacted]w[redacted]d   F Vag-Spont Local  LIV    Social History   Socioeconomic History   Marital status: Single    Spouse name: Not on file   Number of children: Not on file   Years of education: Not on file   Highest education level: Not on file  Occupational History   Not on file  Tobacco Use   Smoking status: Former    Current packs/day: 0.00    Types: Cigarettes    Start date: 04/05/2008    Quit date: 04/06/2011    Years since quitting: 12.5   Smokeless tobacco: Never   Tobacco comments:    05/01/2013 "smoked [place]"  Vaping Use   Vaping status: Never Used  Substance and Sexual  Activity   Alcohol use: Not Currently    Comment: 05/01/2013 "none recently" -occasional   Drug use: Not Currently   Sexual activity: Yes    Partners: Male    Comment: not since July  Other Topics Concern   Not on file  Social History Narrative   Not on file   Social Drivers of Health   Financial Resource Strain: Not on file  Food Insecurity: No Food Insecurity (09/30/2023)   Hunger Vital Sign    Worried About Running Out of Food in the Last Year: Never true    Ran Out of Food in the Last Year: Never true  Transportation Needs: No Transportation Needs (09/30/2023)   PRAPARE - Administrator, Civil Service (Medical): No    Lack of Transportation (Non-Medical): No  Physical Activity: Not on file  Stress: No Stress Concern Present (08/05/2023)   Received from [facility] of Occupational [facility] - Occupational Stress Questionnaire    Feeling of Stress : Not at all  Social Connections: Moderately Isolated (09/30/2023)   Social Connection and Isolation Panel [NHANES]    Frequency of Communication with Friends and Family: More than three times a week    Frequency of  Social Gatherings with Friends and Family: Three times a week    Attends Religious Services: 1 to 4 times per year    Active Member of Clubs or Organizations: No    Attends Banker Meetings: Never    Marital Status: Never married    Family History  Problem Relation Age of Onset   Hypertension Father    Diabetes Paternal Grandmother     Medications Prior to Admission  Medication Sig Dispense Refill Last Dose/Taking   aspirin EC 81 MG tablet Take 81 mg by mouth daily. Swallow whole.   10/04/2023 at 11:30 AM   furosemide (LASIX) 20 MG tablet Take 1 tablet (20 mg total) by mouth daily for 5 days. 5 tablet 0 10/04/2023 at 11:30 AM   gabapentin (NEURONTIN) 300 MG capsule Take 1 capsule (300 mg total) by mouth 2 (two) times daily. 60 capsule 0 10/04/2023 at  7:00 PM   hydrOXYzine  (ATARAX) 25 MG tablet Take 1-2 tablets (25-50 mg total) by mouth 3 (three) times daily as needed for anxiety. 30 tablet 2 10/04/2023 at  7:00 PM   ibuprofen (ADVIL) 800 MG tablet Take 1 tablet (800 mg total) by mouth 3 (three) times daily as needed for moderate pain (pain score 4-6), mild pain (pain score 1-3) or cramping. 60 tablet 0 10/04/2023 at  7:00 PM   ondansetron (ZOFRAN) 4 MG tablet Take 1 tablet (4 mg total) by mouth every 8 (eight) hours as needed for nausea or vomiting. 20 tablet 0 10/01/2023   oxyCODONE (OXY IR/ROXICODONE) 5 MG immediate release tablet Take 1 tablet (5 mg total) by mouth every 4 (four) hours as needed for severe pain (pain score 7-10) or breakthrough pain (pain scale 4-7). 15 tablet 0 10/04/2023 at  7:00 PM   potassium chloride SA (KLOR-CON M) 20 MEQ tablet Take 1 tablet (20 mEq total) by mouth daily for 5 days. 5 tablet 0 10/04/2023 at 11:30 AM   Prenatal Vit-Fe Fumarate-FA (MULTIVITAMIN-PRENATAL) 27-0.8 MG TABS tablet Take 1 tablet by mouth daily at 12 noon.   10/04/2023 at 11:30 AM   senna-docusate (SENOKOT-S) 8.6-50 MG tablet Take 2 tablets by mouth at bedtime as needed for mild constipation or moderate constipation. 30 tablet 3 10/04/2023 at 11:30 AM    No Known Allergies  Review of Systems: Negative except for what is mentioned in HPI.     Physical Exam: BP (!) 150/84 (BP Location: Left Arm)   Pulse 87   Temp 98 F (36.7 C) (Oral)   Resp 20   SpO2 98%  CONSTITUTIONAL: Well-developed, well-nourished female in no acute distress. Fatigued and ill appearing. HENT:  Normocephalic, atraumatic, External right and left ear normal. Oropharynx is clear and moist EYES: Conjunctivae and EOM are normal.  NECK: Normal range of motion, supple, no masses SKIN: Skin is warm and dry. No rash noted. Not diaphoretic. No erythema. No pallor. NEUROLGIC: Alert and oriented to person, place, and time. Normal reflexes, muscle tone coordination. No cranial nerve deficit  noted. PSYCHIATRIC: Normal mood and affect. Normal behavior. Normal judgment and thought content. CARDIOVASCULAR: Normal heart rate noted, regular rhythm RESPIRATORY: diffuse crackles and diminished breath sounds bilaterally. ABDOMEN: Soft, nontender, nondistended,  PELVIC: deferred MUSCULOSKELETAL: Normal range of motion. No edema and no tenderness. 2+ distal pulses.    Pertinent Labs/Studies:   Results for orders placed or performed during the hospital encounter of 10/05/23 (from the past 72 hours)  Respiratory (~20 pathogens) panel by PCR     Status:  None   Collection Time: 10/05/23  5:39 AM   Specimen: Nasopharyngeal Swab; Respiratory  Result Value Ref Range   Adenovirus NOT DETECTED NOT DETECTED   Coronavirus 229E NOT DETECTED NOT DETECTED    Comment: (NOTE) The Coronavirus on the Respiratory Panel, DOES NOT test for the novel  Coronavirus (2019 nCoV)    Coronavirus HKU1 NOT DETECTED NOT DETECTED   Coronavirus NL63 NOT DETECTED NOT DETECTED   Coronavirus OC43 NOT DETECTED NOT DETECTED   Metapneumovirus NOT DETECTED NOT DETECTED   Rhinovirus / Enterovirus NOT DETECTED NOT DETECTED   Influenza A NOT DETECTED NOT DETECTED   Influenza B NOT DETECTED NOT DETECTED   Parainfluenza Virus 1 NOT DETECTED NOT DETECTED   Parainfluenza Virus 2 NOT DETECTED NOT DETECTED   Parainfluenza Virus 3 NOT DETECTED NOT DETECTED   Parainfluenza Virus 4 NOT DETECTED NOT DETECTED   Respiratory Syncytial Virus NOT DETECTED NOT DETECTED   Bordetella pertussis NOT DETECTED NOT DETECTED   Bordetella Parapertussis NOT DETECTED NOT DETECTED   Chlamydophila pneumoniae NOT DETECTED NOT DETECTED   Mycoplasma pneumoniae NOT DETECTED NOT DETECTED    Comment: Performed at [facility], [address], [place], [place] [address]  CBC with Differential/Platelet     Status: Abnormal   Collection Time: 10/05/23  5:41 AM  Result Value Ref Range   WBC 9.3 4.0 - 10.5 K/uL   RBC 3.98 3.87 - 5.11 MIL/uL    Hemoglobin 11.4 (L) 12.0 - 15.0 g/dL   HCT 60.4 (L) 54.0 - 98.1 %   MCV 86.9 80.0 - 100.0 fL   MCH 28.6 26.0 - 34.0 pg   MCHC 32.9 30.0 - 36.0 g/dL   RDW 19.1 47.8 - 29.5 %   Platelets 248 150 - 400 K/uL   nRBC 0.0 0.0 - 0.2 %   Neutrophils Relative % 74 %   Neutro Abs 6.8 1.7 - 7.7 K/uL   Lymphocytes Relative 17 %   Lymphs Abs 1.6 0.7 - 4.0 K/uL   Monocytes Relative 7 %   Monocytes Absolute 0.7 0.1 - 1.0 K/uL   Eosinophils Relative 1 %   Eosinophils Absolute 0.1 0.0 - 0.5 K/uL   Basophils Relative 0 %   Basophils Absolute 0.0 0.0 - 0.1 K/uL   Immature Granulocytes 1 %   Abs Immature Granulocytes 0.11 (H) 0.00 - 0.07 K/uL    Comment: Performed at [facility], [address], [place], [place] [address]  Comprehensive metabolic panel     Status: Abnormal   Collection Time: 10/05/23  5:41 AM  Result Value Ref Range   Sodium 140 135 - 145 mmol/L   Potassium 4.0 3.5 - 5.1 mmol/L   Chloride 104 98 - 111 mmol/L   CO2 26 22 - 32 mmol/L   Glucose, Bld 95 70 - 99 mg/dL    Comment: Glucose reference range applies only to samples taken after fasting for at least 8 hours.   BUN 10 6 - 20 mg/dL   Creatinine, Ser 8.65 0.44 - 1.00 mg/dL   Calcium 8.6 (L) 8.9 - 10.3 mg/dL   Total Protein 5.5 (L) 6.5 - 8.1 g/dL   Albumin 2.4 (L) 3.5 - 5.0 g/dL   AST 19 15 - 41 U/L   ALT 26 0 - 44 U/L   Alkaline Phosphatase 98 38 - 126 U/L   Total Bilirubin 0.3 0.0 - 1.2 mg/dL   GFR, Estimated >78 >46 mL/min    Comment: (NOTE) Calculated using the  CKD-EPI Creatinine Equation (2021)    Anion gap 10 5 - 15    Comment: Performed at [facility], [address], [place], [place] [address]  Brain natriuretic peptide     Status: Abnormal   Collection Time: 10/05/23  5:41 AM  Result Value Ref Range   B Natriuretic Peptide 168.5 (H) 0.0 - 100.0 pg/mL    Comment: Performed at [facility], [address], [place], [place] [address]  Phosphorus     Status: Abnormal   Collection Time: 10/05/23   5:41 AM  Result Value Ref Range   Phosphorus 5.4 (H) 2.5 - 4.6 mg/dL    Comment: Performed at [facility], [address] [place], [place] [address]  D-dimer, quantitative     Status: Abnormal   Collection Time: 10/05/23  5:41 AM  Result Value Ref Range   D-Dimer, Quant 3.67 (H) 0.00 - 0.50 ug/mL-FEU    Comment: (NOTE) At the manufacturer cut-off value of 0.5 g/mL FEU, this assay has a negative predictive value of 95-100%.This assay is intended for use in conjunction with a clinical pretest probability (PTP) assessment model to exclude pulmonary embolism (PE) and deep venous thrombosis (DVT) in outpatients suspected of PE or DVT. Results should be correlated with clinical presentation. Performed at [facility], [address], [place], [place] [address]   Troponin I (High Sensitivity)     Status: Abnormal   Collection Time: 10/05/23  5:41 AM  Result Value Ref Range   Troponin I (High Sensitivity) 60 (H) <18 ng/L    Comment: (NOTE) Elevated high sensitivity troponin I (hsTnI) values and significant  changes across serial measurements may suggest ACS but many other  chronic and acute conditions are known to elevate hsTnI results.  Refer to the "Links" section for chest pain algorithms and additional  guidance. Performed at [facility], [address] [place], [place] [address]   C-reactive protein     Status: Abnormal   Collection Time: 10/05/23  5:41 AM  Result Value Ref Range   CRP 5.9 (H) <1.0 mg/dL    Comment: Performed at [facility], [address] [place], [place] [address]  Troponin I (High Sensitivity)     Status: Abnormal   Collection Time: 10/05/23  7:48 AM  Result Value Ref Range   Troponin I (High Sensitivity) 44 (H) <18 ng/L    Comment: (NOTE) Elevated high sensitivity troponin I (hsTnI) values and significant  changes across serial measurements may suggest ACS but many other  chronic and acute conditions are known to elevate  hsTnI results.  Refer to the "Links" section for chest pain algorithms and additional  guidance. Performed at [facility], [address] [place], [place] [address]        Assessment and Plan :Pamela Klein is a 33 y.o. [phone] admitted for pneumonia with superimposed postpartum preeclampsia  There is an outside chance the pneumonia may be a representation of pulmonary edema from the preeclampsia.   Agree with hospitalist to continue antibiotic coverage for pneumonia while treating postpartum preeclampsia with anti hypertensives, lasix and magnesium sulfate. Recommend magnesium sulfate for 24 hours. Will recheck CMP in the AM Pneumonia management per hospitalist.   Thank you for this consult, we will follow along.   For OB/GYN questions, please call the Center for [facility] Monday - Friday, 8 am - 5 pm: [phone] All other times: [phone]    Mariel Aloe M.D. Attending Obstetrician & Gynecologist, [facility] for [employer], [facility]

## 2023-10-05 NOTE — H&P (Addendum)
History and Physical    Patient: Pamela Klein HYQ:[id number] DOB: 1990-12-16 DOA: 10/05/2023 DOS: the patient was seen and examined on 10/05/2023 PCP: Osborn Coho, MD  Patient coming from: Home  Chief Complaint:  Chief Complaint  Patient presents with   Shortness of Breath   Chest Pain   Cough   HPI: Pamela Klein is a 33 y.o. female with medical history significant of vaginal delivery on October 01, 2023.  Patient is G2 P1-0-0-1. Child in NICU due to noted lack of spontaneous resp effort. S.p code . Patient had 1st degree laceration cervics x/p repair.  Patient was discharged home on October 03, 2023.  Patient states that she was doing well till she went to bed last evening on October 04, 2023.  Patient reports waking up in the middle of the night around 1 AM with coughing, no expectoration.  Feeling shortness of breath and tightness of chest.  Patient denies any history of asthma or COPD.  Patient symptom of shortness of breath was not relieved therefore patient came to the ER.  Patient denies any fever.  Patient reports she has had bilateral lower extremity swelling since she had her delivery done with epidural anesthesia.  Although the lower extremity swelling apparently has been improving since then.  Patient was initially evaluated in the maternal T ER.  Patient was found to be hypoxic, chest x-ray shows bilateral infiltrates.  Patient was transiently maintained on nonrebreather mask.  Now since has been down titrated to 3 L/min of supplementary oxygen. medical evaluation is sought.  Patient denies any palpitations syncope or similar episodes in the past.  Review of Systems: As mentioned in the history of present illness. All other systems reviewed and are negative. Past Medical History:  Diagnosis Date   Cannabis abuse 05/01/2013   Chlamydia    Chronic back pain    Community acquired pneumonia 05/01/2013   H/O varicella    Sepsis (HCC) 05/01/2013   Vitamin D  deficiency 11/28/2017   Past Surgical History:  Procedure Laterality Date   NO PAST SURGERIES     Social History:  reports that she quit smoking about 12 years ago. Her smoking use included cigarettes. She started smoking about 15 years ago. She has never used smokeless tobacco. She reports that she does not currently use alcohol. She reports that she does not currently use drugs.  No Known Allergies  Family History  Problem Relation Age of Onset   Hypertension Father    Diabetes Paternal Grandmother     Prior to Admission medications   Medication Sig Start Date End Date Taking? Authorizing Provider  aspirin EC 81 MG tablet Take 81 mg by mouth daily. Swallow whole.   Yes [provider]  furosemide (LASIX) 20 MG tablet Take 1 tablet (20 mg total) by mouth daily for 5 days. 10/03/23 10/08/23 Yes Anyanwu, Jethro Bastos, MD  gabapentin (NEURONTIN) 300 MG capsule Take 1 capsule (300 mg total) by mouth 2 (two) times daily. 10/01/23  Yes Anyanwu, Jethro Bastos, MD  hydrOXYzine (ATARAX) 25 MG tablet Take 1-2 tablets (25-50 mg total) by mouth 3 (three) times daily as needed for anxiety. 10/01/23  Yes Anyanwu, Jethro Bastos, MD  ibuprofen (ADVIL) 800 MG tablet Take 1 tablet (800 mg total) by mouth 3 (three) times daily as needed for moderate pain (pain score 4-6), mild pain (pain score 1-3) or cramping. 10/01/23  Yes Anyanwu, Ugonna A, MD  ondansetron (ZOFRAN) 4 MG tablet Take 1 tablet (4 mg total) by  mouth every 8 (eight) hours as needed for nausea or vomiting. 09/06/23  Yes Federico Flake, MD  oxyCODONE (OXY IR/ROXICODONE) 5 MG immediate release tablet Take 1 tablet (5 mg total) by mouth every 4 (four) hours as needed for severe pain (pain score 7-10) or breakthrough pain (pain scale 4-7). 10/01/23  Yes Anyanwu, Jethro Bastos, MD  potassium chloride SA (KLOR-CON M) 20 MEQ tablet Take 1 tablet (20 mEq total) by mouth daily for 5 days. 10/03/23 10/08/23 Yes Anyanwu, Jethro Bastos, MD  Prenatal Vit-Fe Fumarate-FA  (MULTIVITAMIN-PRENATAL) 27-0.8 MG TABS tablet Take 1 tablet by mouth daily at 12 noon.   Yes [provider]  senna-docusate (SENOKOT-S) 8.6-50 MG tablet Take 2 tablets by mouth at bedtime as needed for mild constipation or moderate constipation. 10/02/23  Yes Tereso Newcomer, MD    Physical Exam: Vitals:   10/05/23 0930 10/05/23 0935 10/05/23 0940 10/05/23 0945  BP: 123/66   123/70  Pulse: 76   80  Resp:      Temp:      SpO2: 92% 92% 92% 93%   General: Obese appearing lady appears to be no immediate distress.  However during exam and worse patient was noted to be tachypneic.  Patient is wearing nasal gel on the fingers.  Which makes it difficult to assess her pulse oximetry accurately. Respiratory exam: There are diffuse fine crackles heard in both lung fields Cardiovascular exam S1-S2 normal soft Abdomen all quadrants are soft nontender Extremities 2+ edema up to bilateral knees. Patient is alert awake oriented x 3 no focal motor deficit Data Reviewed:  Labs on Admission:  Results for orders placed or performed during the hospital encounter of 10/05/23 (from the past 24 hours)  Respiratory (~20 pathogens) panel by PCR     Status: None   Collection Time: 10/05/23  5:39 AM   Specimen: Nasopharyngeal Swab; Respiratory  Result Value Ref Range   Adenovirus NOT DETECTED NOT DETECTED   Coronavirus 229E NOT DETECTED NOT DETECTED   Coronavirus HKU1 NOT DETECTED NOT DETECTED   Coronavirus NL63 NOT DETECTED NOT DETECTED   Coronavirus OC43 NOT DETECTED NOT DETECTED   Metapneumovirus NOT DETECTED NOT DETECTED   Rhinovirus / Enterovirus NOT DETECTED NOT DETECTED   Influenza A NOT DETECTED NOT DETECTED   Influenza B NOT DETECTED NOT DETECTED   Parainfluenza Virus 1 NOT DETECTED NOT DETECTED   Parainfluenza Virus 2 NOT DETECTED NOT DETECTED   Parainfluenza Virus 3 NOT DETECTED NOT DETECTED   Parainfluenza Virus 4 NOT DETECTED NOT DETECTED   Respiratory Syncytial Virus NOT DETECTED  NOT DETECTED   Bordetella pertussis NOT DETECTED NOT DETECTED   Bordetella Parapertussis NOT DETECTED NOT DETECTED   Chlamydophila pneumoniae NOT DETECTED NOT DETECTED   Mycoplasma pneumoniae NOT DETECTED NOT DETECTED  CBC with Differential/Platelet     Status: Abnormal   Collection Time: 10/05/23  5:41 AM  Result Value Ref Range   WBC 9.3 4.0 - 10.5 K/uL   RBC 3.98 3.87 - 5.11 MIL/uL   Hemoglobin 11.4 (L) 12.0 - 15.0 g/dL   HCT 16.1 (L) 09.6 - 04.5 %   MCV 86.9 80.0 - 100.0 fL   MCH 28.6 26.0 - 34.0 pg   MCHC 32.9 30.0 - 36.0 g/dL   RDW 40.9 81.1 - 91.4 %   Platelets 248 150 - 400 K/uL   nRBC 0.0 0.0 - 0.2 %   Neutrophils Relative % 74 %   Neutro Abs 6.8 1.7 - 7.7 K/uL  Lymphocytes Relative 17 %   Lymphs Abs 1.6 0.7 - 4.0 K/uL   Monocytes Relative 7 %   Monocytes Absolute 0.7 0.1 - 1.0 K/uL   Eosinophils Relative 1 %   Eosinophils Absolute 0.1 0.0 - 0.5 K/uL   Basophils Relative 0 %   Basophils Absolute 0.0 0.0 - 0.1 K/uL   Immature Granulocytes 1 %   Abs Immature Granulocytes 0.11 (H) 0.00 - 0.07 K/uL  Comprehensive metabolic panel     Status: Abnormal   Collection Time: 10/05/23  5:41 AM  Result Value Ref Range   Sodium 140 135 - 145 mmol/L   Potassium 4.0 3.5 - 5.1 mmol/L   Chloride 104 98 - 111 mmol/L   CO2 26 22 - 32 mmol/L   Glucose, Bld 95 70 - 99 mg/dL   BUN 10 6 - 20 mg/dL   Creatinine, Ser 1.61 0.44 - 1.00 mg/dL   Calcium 8.6 (L) 8.9 - 10.3 mg/dL   Total Protein 5.5 (L) 6.5 - 8.1 g/dL   Albumin 2.4 (L) 3.5 - 5.0 g/dL   AST 19 15 - 41 U/L   ALT 26 0 - 44 U/L   Alkaline Phosphatase 98 38 - 126 U/L   Total Bilirubin 0.3 0.0 - 1.2 mg/dL   GFR, Estimated >09 >60 mL/min   Anion gap 10 5 - 15  Brain natriuretic peptide     Status: Abnormal   Collection Time: 10/05/23  5:41 AM  Result Value Ref Range   B Natriuretic Peptide 168.5 (H) 0.0 - 100.0 pg/mL  Phosphorus     Status: Abnormal   Collection Time: 10/05/23  5:41 AM  Result Value Ref Range   Phosphorus  5.4 (H) 2.5 - 4.6 mg/dL  D-dimer, quantitative     Status: Abnormal   Collection Time: 10/05/23  5:41 AM  Result Value Ref Range   D-Dimer, Quant 3.67 (H) 0.00 - 0.50 ug/mL-FEU  Troponin I (High Sensitivity)     Status: Abnormal   Collection Time: 10/05/23  5:41 AM  Result Value Ref Range   Troponin I (High Sensitivity) 60 (H) <18 ng/L  C-reactive protein     Status: Abnormal   Collection Time: 10/05/23  5:41 AM  Result Value Ref Range   CRP 5.9 (H) <1.0 mg/dL  Troponin I (High Sensitivity)     Status: Abnormal   Collection Time: 10/05/23  7:48 AM  Result Value Ref Range   Troponin I (High Sensitivity) 44 (H) <18 ng/L   Basic Metabolic Panel: Recent Labs  Lab 10/05/23 0541  NA 140  K 4.0  CL 104  CO2 26  GLUCOSE 95  BUN 10  CREATININE 0.55  CALCIUM 8.6*  PHOS 5.4*   Liver Function Tests: Recent Labs  Lab 10/05/23 0541  AST 19  ALT 26  ALKPHOS 98  BILITOT 0.3  PROT 5.5*  ALBUMIN 2.4*   No results for input(s): "LIPASE", "AMYLASE" in the last 168 hours. No results for input(s): "AMMONIA" in the last 168 hours. CBC: Recent Labs  Lab 09/30/23 0032 10/02/23 0336 10/05/23 0541  WBC 8.4 17.9* 9.3  NEUTROABS  --   --  6.8  HGB 11.9* 10.3* 11.4*  HCT 35.7* 30.9* 34.6*  MCV 86.4 88.3 86.9  PLT 188 174 248   Cardiac Enzymes: Recent Labs  Lab 10/05/23 0541 10/05/23 0748  TROPONINIHS 60* 44*    BNP (last 3 results) No results for input(s): "PROBNP" in the last 8760 hours. CBG: Recent Labs  Lab  09/30/23 1351 09/30/23 1807 09/30/23 2226 10/01/23 0519 10/01/23 1004  GLUCAP 110* 96 150* 108* 104*    Radiological Exams on Admission:  DG CHEST PORT 1 VIEW Result Date: 10/05/2023 CLINICAL DATA:  34 year old female with history of chest pain. EXAM: PORTABLE CHEST 1 VIEW COMPARISON:  Chest x-ray 11/15/2015. FINDINGS: Widespread areas of interstitial prominence, diffuse peribronchial cuffing and patchy multifocal airspace disease scattered throughout the  lungs bilaterally. No pleural effusions. No pneumothorax. Pulmonary vasculature does not appear engorged. Heart size is upper limits of normal. Upper mediastinal contours are within normal limits. IMPRESSION: 1. The appearance of the chest is concerning for multilobar bilateral bronchopneumonia. Electronically Signed   By: Trudie Reed M.D.   On: 10/05/2023 05:56    EKG: pending.  No intake/output data recorded. No intake/output data recorded.      Assessment and Plan: CAP (community acquired pneumonia) Sudden onset of shortness of breath associated with chest x-ray finding of bilateral patchy lung infiltrates.  Blood cultures ordered.  Pending COVID-19 testing, respiratory viral panel is negative.  We will start treatment with ceftriaxone and azithromycin. No fever or WBC. Associated with acute hypoxemic respiratory failure, although pulse oximetry is a little bit hard to infer because of patient wearing nasal gel.  Will request respiratory therapy evaluation to consider ear probe.  Uptitrate oxygen to patient comfort.  Differential includes fluid overload (patient on 20 mg lasix at home) given marked edema of lower extremity as well as finding of elevated BNP.  I will empirically give 1 dose of Lasix.  Patient troponin elevation is likely due to hypoxemia, however given the overall clinical picture we will pursue an echo as well.  Will also get lower extremity Dopplers to rule out DVT.  Postpartum care following vaginal delivery I discussed case with Dr. Wyn Forster - no active OBGYN issues per her . Loko forward to there note. C.w. home rx per them.  History of gestational hypertension PRN labetalol ordered per OBGYN. Discussed with fellow Dr. Leanora Cover. Concideration of pre-ecclempsia per them. They may order magnesium sulfate ppx.   EKG pending Urine protein creatine ratio pending.  Please review medication reconciliation after pharmacy input Discharge medication list from  January 27 is reviewed.  Indication for gabapentin not apparent.   Given fluid overload will not restart ibuprofen.  May continue hydroxyzine 25 mg as needed for anxiety.  Patinet advised that some medications may be secreted in breast milk and not appropriate for new born. To discuss with NICU staff / OBGYN for breast feeding.   Advance Care Planning:   Code Status: Prior full code.  Consults: no follow up intended.   Family Communication: per patient.  Severity of Illness: The appropriate patient status for this patient is INPATIENT. Inpatient status is judged to be reasonable and necessary in order to provide the required intensity of service to ensure the patient's safety. The patient's presenting symptoms, physical exam findings, and initial radiographic and laboratory data in the context of their chronic comorbidities is felt to place them at high risk for further clinical deterioration. Furthermore, it is not anticipated that the patient will be medically stable for discharge from the hospital within 2 midnights of admission.   * I certify that at the point of admission it is my clinical judgment that the patient will require inpatient hospital care spanning beyond 2 midnights from the point of admission due to high intensity of service, high risk for further deterioration and high frequency of surveillance required.*  Author:  Nolberto Hanlon, MD 10/05/2023 10:03 AM  For on call review [email or web address].

## 2023-10-05 NOTE — MAU Provider Note (Signed)
Chief Complaint:  Shortness of Breath, Chest Pain, and Cough   HPI   None     Pamela Klein is a 33 y.o. [id number] postpartum patient s/p NVSB  who presents to maternity admissions reporting SOB, cough, chest pain. This started at 0030 today.   Pregnancy Course: A1GDM with IOL   Past Medical History:  Diagnosis Date   Cannabis abuse 05/01/2013   Chlamydia    Chronic back pain    Community acquired pneumonia 05/01/2013   H/O varicella    Sepsis (HCC) 05/01/2013   Vitamin D deficiency 11/28/2017   OB History  Gravida Para Term Preterm AB Living  2 2 2  0 0 2  SAB IAB Ectopic Multiple Live Births  0 0 0 0 2    # Outcome Date GA Lbr Len/2nd Weight Sex Type Anes PTL Lv  2 Term 10/01/23 [redacted]w[redacted]d 21:36 / 00:06 3470 g M Vag-Spont EPI  LIV  1 Term 02/27/12 [redacted]w[redacted]d   F Vag-Spont Local  LIV   Past Surgical History:  Procedure Laterality Date   NO PAST SURGERIES     Family History  Problem Relation Age of Onset   Hypertension Father    Diabetes Paternal Grandmother    Social History   Tobacco Use   Smoking status: Former    Current packs/day: 0.00    Types: Cigarettes    Start date: 04/05/2008    Quit date: 04/06/2011    Years since quitting: 12.5   Smokeless tobacco: Never   Tobacco comments:    05/01/2013 "smoked Black [place] Milds"  Vaping Use   Vaping status: Never Used  Substance Use Topics   Alcohol use: Not Currently    Comment: 05/01/2013 "none recently" -occasional   Drug use: Not Currently   No Known Allergies Medications Prior to Admission  Medication Sig Dispense Refill Last Dose/Taking   aspirin EC 81 MG tablet Take 81 mg by mouth daily. Swallow whole.   10/04/2023 at 11:30 AM   furosemide (LASIX) 20 MG tablet Take 1 tablet (20 mg total) by mouth daily for 5 days. 5 tablet 0 10/04/2023 at 11:30 AM   gabapentin (NEURONTIN) 300 MG capsule Take 1 capsule (300 mg total) by mouth 2 (two) times daily. 60 capsule 0 10/04/2023 at  7:00 PM   hydrOXYzine (ATARAX) 25 MG tablet  Take 1-2 tablets (25-50 mg total) by mouth 3 (three) times daily as needed for anxiety. 30 tablet 2 10/04/2023 at  7:00 PM   ibuprofen (ADVIL) 800 MG tablet Take 1 tablet (800 mg total) by mouth 3 (three) times daily as needed for moderate pain (pain score 4-6), mild pain (pain score 1-3) or cramping. 60 tablet 0 10/04/2023 at  7:00 PM   ondansetron (ZOFRAN) 4 MG tablet Take 1 tablet (4 mg total) by mouth every 8 (eight) hours as needed for nausea or vomiting. 20 tablet 0 10/01/2023   oxyCODONE (OXY IR/ROXICODONE) 5 MG immediate release tablet Take 1 tablet (5 mg total) by mouth every 4 (four) hours as needed for severe pain (pain score 7-10) or breakthrough pain (pain scale 4-7). 15 tablet 0 10/04/2023 at  7:00 PM   potassium chloride SA (KLOR-CON M) 20 MEQ tablet Take 1 tablet (20 mEq total) by mouth daily for 5 days. 5 tablet 0 10/04/2023 at 11:30 AM   Prenatal Vit-Fe Fumarate-FA (MULTIVITAMIN-PRENATAL) 27-0.8 MG TABS tablet Take 1 tablet by mouth daily at 12 noon.   10/04/2023 at 11:30 AM   senna-docusate (SENOKOT-S) 8.6-50 MG tablet  Take 2 tablets by mouth at bedtime as needed for mild constipation or moderate constipation. 30 tablet 3 10/04/2023 at 11:30 AM    I have reviewed patient's Past Medical Hx, Surgical Hx, Family Hx, Social Hx, medications and allergies.   ROS  Pertinent items noted in HPI and remainder of comprehensive ROS otherwise negative.   PHYSICAL EXAM  Patient Vitals for the past 24 hrs:  BP Temp Pulse Resp SpO2  10/05/23 0755 -- -- -- -- 100 %  10/05/23 0750 -- -- -- -- 100 %  10/05/23 0745 (!) 148/89 -- 99 -- 99 %  10/05/23 0740 -- -- -- -- 100 %  10/05/23 0739 -- -- -- -- 100 %  10/05/23 0735 -- -- -- -- 100 %  10/05/23 0730 138/76 -- 89 -- 100 %  10/05/23 0725 -- -- -- -- 100 %  10/05/23 0720 -- -- -- -- 100 %  10/05/23 0716 (!) 156/74 -- (!) 102 -- 100 %  10/05/23 0712 -- -- -- -- (!) 87 %  10/05/23 0700 -- -- -- -- (!) 87 %  10/05/23 0655 -- -- -- -- (!) 88 %   10/05/23 0650 -- -- -- -- 90 %  10/05/23 0645 (!) 152/88 -- 90 -- 94 %  10/05/23 0640 -- -- -- -- 95 %  10/05/23 0635 (!) 155/89 -- 91 -- 93 %  10/05/23 0630 (!) 169/102 -- 98 -- 96 %  10/05/23 0625 -- -- -- -- 92 %  10/05/23 0624 -- -- -- -- 93 %  10/05/23 0620 -- -- -- -- 93 %  10/05/23 0615 (!) 157/88 -- 92 -- 93 %  10/05/23 0610 -- -- -- -- 99 %  10/05/23 0605 -- -- -- -- 96 %  10/05/23 0600 (!) 149/86 -- 81 -- --  10/05/23 0559 -- -- -- -- 96 %  10/05/23 0555 -- -- -- -- 94 %  10/05/23 0550 -- -- -- -- 95 %  10/05/23 0549 -- -- -- -- 98 %  10/05/23 0545 (!) 151/95 -- 87 -- --  10/05/23 0544 -- -- -- -- 91 %  10/05/23 0540 -- -- -- -- 94 %  10/05/23 0537 -- -- -- -- 95 %  10/05/23 0530 -- -- -- -- 94 %  10/05/23 0529 (!) 155/87 98.9 F (37.2 C) 86 19 90 %    Physical Exam Constitutional:      General: She is in acute distress.     Appearance: She is ill-appearing.  HENT:     Head: Normocephalic and atraumatic.     Nose: Congestion and rhinorrhea present.  Cardiovascular:     Rate and Rhythm: Normal rate.  Pulmonary:     Breath sounds: Rhonchi present.  Skin:    General: Skin is warm.  Neurological:     Mental Status: She is oriented to person, place, and time.  Psychiatric:        Mood and Affect: Mood normal.        Behavior: Behavior normal.        Thought Content: Thought content normal.        Judgment: Judgment normal.        Labs: Results for orders placed or performed during the hospital encounter of 10/05/23 (from the past 24 hours)  Respiratory (~20 pathogens) panel by PCR     Status: None   Collection Time: 10/05/23  5:39 AM   Specimen: Nasopharyngeal Swab; Respiratory  Result Value Ref Range  Adenovirus NOT DETECTED NOT DETECTED   Coronavirus 229E NOT DETECTED NOT DETECTED   Coronavirus HKU1 NOT DETECTED NOT DETECTED   Coronavirus NL63 NOT DETECTED NOT DETECTED   Coronavirus OC43 NOT DETECTED NOT DETECTED   Metapneumovirus NOT DETECTED NOT  DETECTED   Rhinovirus / Enterovirus NOT DETECTED NOT DETECTED   Influenza A NOT DETECTED NOT DETECTED   Influenza B NOT DETECTED NOT DETECTED   Parainfluenza Virus 1 NOT DETECTED NOT DETECTED   Parainfluenza Virus 2 NOT DETECTED NOT DETECTED   Parainfluenza Virus 3 NOT DETECTED NOT DETECTED   Parainfluenza Virus 4 NOT DETECTED NOT DETECTED   Respiratory Syncytial Virus NOT DETECTED NOT DETECTED   Bordetella pertussis NOT DETECTED NOT DETECTED   Bordetella Parapertussis NOT DETECTED NOT DETECTED   Chlamydophila pneumoniae NOT DETECTED NOT DETECTED   Mycoplasma pneumoniae NOT DETECTED NOT DETECTED  CBC with Differential/Platelet     Status: Abnormal   Collection Time: 10/05/23  5:41 AM  Result Value Ref Range   WBC 9.3 4.0 - 10.5 K/uL   RBC 3.98 3.87 - 5.11 MIL/uL   Hemoglobin 11.4 (L) 12.0 - 15.0 g/dL   HCT 40.9 (L) 81.1 - 91.4 %   MCV 86.9 80.0 - 100.0 fL   MCH 28.6 26.0 - 34.0 pg   MCHC 32.9 30.0 - 36.0 g/dL   RDW 78.2 95.6 - 21.3 %   Platelets 248 150 - 400 K/uL   nRBC 0.0 0.0 - 0.2 %   Neutrophils Relative % 74 %   Neutro Abs 6.8 1.7 - 7.7 K/uL   Lymphocytes Relative 17 %   Lymphs Abs 1.6 0.7 - 4.0 K/uL   Monocytes Relative 7 %   Monocytes Absolute 0.7 0.1 - 1.0 K/uL   Eosinophils Relative 1 %   Eosinophils Absolute 0.1 0.0 - 0.5 K/uL   Basophils Relative 0 %   Basophils Absolute 0.0 0.0 - 0.1 K/uL   Immature Granulocytes 1 %   Abs Immature Granulocytes 0.11 (H) 0.00 - 0.07 K/uL  Comprehensive metabolic panel     Status: Abnormal   Collection Time: 10/05/23  5:41 AM  Result Value Ref Range   Sodium 140 135 - 145 mmol/L   Potassium 4.0 3.5 - 5.1 mmol/L   Chloride 104 98 - 111 mmol/L   CO2 26 22 - 32 mmol/L   Glucose, Bld 95 70 - 99 mg/dL   BUN 10 6 - 20 mg/dL   Creatinine, Ser 0.86 0.44 - 1.00 mg/dL   Calcium 8.6 (L) 8.9 - 10.3 mg/dL   Total Protein 5.5 (L) 6.5 - 8.1 g/dL   Albumin 2.4 (L) 3.5 - 5.0 g/dL   AST 19 15 - 41 U/L   ALT 26 0 - 44 U/L   Alkaline  Phosphatase 98 38 - 126 U/L   Total Bilirubin 0.3 0.0 - 1.2 mg/dL   GFR, Estimated >57 >84 mL/min   Anion gap 10 5 - 15  Brain natriuretic peptide     Status: Abnormal   Collection Time: 10/05/23  5:41 AM  Result Value Ref Range   B Natriuretic Peptide 168.5 (H) 0.0 - 100.0 pg/mL  Phosphorus     Status: Abnormal   Collection Time: 10/05/23  5:41 AM  Result Value Ref Range   Phosphorus 5.4 (H) 2.5 - 4.6 mg/dL  D-dimer, quantitative     Status: Abnormal   Collection Time: 10/05/23  5:41 AM  Result Value Ref Range   D-Dimer, Quant 3.67 (H) 0.00 - 0.50 ug/mL-FEU  Troponin I (High Sensitivity)     Status: Abnormal   Collection Time: 10/05/23  5:41 AM  Result Value Ref Range   Troponin I (High Sensitivity) 60 (H) <18 ng/L  C-reactive protein     Status: Abnormal   Collection Time: 10/05/23  5:41 AM  Result Value Ref Range   CRP 5.9 (H) <1.0 mg/dL    Imaging:  DG CHEST PORT 1 VIEW Result Date: 10/05/2023 CLINICAL DATA:  33 year old female with history of chest pain. EXAM: PORTABLE CHEST 1 VIEW COMPARISON:  Chest x-ray 11/15/2015. FINDINGS: Widespread areas of interstitial prominence, diffuse peribronchial cuffing and patchy multifocal airspace disease scattered throughout the lungs bilaterally. No pleural effusions. No pneumothorax. Pulmonary vasculature does not appear engorged. Heart size is upper limits of normal. Upper mediastinal contours are within normal limits. IMPRESSION: 1. The appearance of the chest is concerning for multilobar bilateral bronchopneumonia. Electronically Signed   By: Trudie Reed M.D.   On: 10/05/2023 05:56    MDM & MAU COURSE  MDM: Reviewed records Serial vital signs Imaging/labs to rule out PE Consult Dr. Despina Hidden Call to hospitalist at [phone]  MAU Course: Orders Placed This Encounter  Procedures   Respiratory (~20 pathogens) panel by PCR   SARS Coronavirus 2 by RT PCR (hospital order, performed in [facility] hospital lab) *cepheid single result test*  Anterior Nasal Swab   Culture, blood (Routine X 2) w Reflex to ID Panel   DG CHEST PORT 1 VIEW   CBC with Differential/Platelet   Comprehensive metabolic panel   Brain natriuretic peptide   Phosphorus   D-dimer, quantitative   C-reactive protein   Apply Hypertensive Disorders of Pregnancy Care Plan   Measure blood pressure   Airborne and Contact precautions   Oxygen therapy Mode or (Route): Nasal cannula; Keep O2 saturation between: 95-100   Oxygen therapy Mode or (Route): Non-rebreather and nasal cannula; FiO2: 21%   EKG 12-Lead   Insert peripheral IV   Meds ordered this encounter  Medications   NIFEdipine (PROCARDIA) capsule 10 mg   cyclobenzaprine (FLEXERIL) tablet 10 mg   ibuprofen (ADVIL) tablet 800 mg   AND Linked Order Group    labetalol (NORMODYNE) injection 20 mg    labetalol (NORMODYNE) injection 40 mg    labetalol (NORMODYNE) injection 80 mg    hydrALAZINE (APRESOLINE) injection 10 mg   ipratropium-albuterol (DUONEB) 0.5-2.5 (3) MG/3ML nebulizer solution 3 mL   cefTRIAXone (ROCEPHIN) 1 g in sodium chloride 0.9 % 100 mL IVPB    Antibiotic Indication::   CAP   azithromycin (ZITHROMAX) 500 mg in sodium chloride 0.9 % 250 mL IVPB    Antibiotic Indication::   CAP   Call to hospitalist service at [phone]. Informed that they will start returning pages at 0700, but it may be closer to 0800 when we receive a call back.    ASSESSMENT   1. Pneumonia of both lungs due to infectious organism, unspecified part of lung   2. Hypertension, postpartum condition or complication    Patient imaging showed multilobar bronchopneumonia. Patient with difficulty maintaining O2 sat without supportive O2 via nasal cannula.   PLAN  Admit to med-surg.  While awaiting admission to med-surg unit  as deemed appropriate by med-surg provider, breathing treatment ordered.  Handoff of care to Dr. Maryjean Ka @ [phone], who will assume care and admit this patient.    [name], MSN,  CNM 10/05/2023 7:58 AM  Certified Nurse Midwife, [facility]

## 2023-10-05 NOTE — MAU Note (Signed)
Pt arrived via EMS - says she del vag on Sat . Was D/C on Monday. Baby is in NICU Pt says she woke at 0030- with cough- with mucus  and chest pain and SOB .

## 2023-10-05 NOTE — Assessment & Plan Note (Signed)
I discussed case with Dr. Wyn Forster - no active OBGYN issues per her . Loko forward to there note. C.w. home rx per them.

## 2023-10-05 NOTE — Lactation Note (Signed)
This note was copied from a baby's chart.  NICU Lactation Consultation Note  Patient Name: Boy Daylah Sayavong [id number] Date: 10/05/2023 Age:33 days  Reason for consult: Follow-up assessment; NICU baby; Term; Maternal endocrine disorder; Other (Comment) (Re-admission to MAU/OBSC) Type of Endocrine Disorder?: Diabetes (A1GDM)  SUBJECTIVE Visited with family of 22 hours old FT NICU female; Ms. Chinn is a P2 and reported she hasn't been able to pump consistently since she got discharged due to not feeling well, she got re-admitted to [facility] this morning to O/R pneumonia (see maternal assessment). Set her up with a DEBP and reviewed pumping schedule and pump settings; explained the importance of consistent pumping for the onset of lactogenesis II and the prevention of engorgement. Fit her with another pumping band in size "L" for hands on pumping, she was appreciative. She voiced she'll start pumping again once she has the chance, she was getting ready to get her ultrasound.   OBJECTIVE Infant data: Mother's Current Feeding Choice: Breast Milk and Formula  O2 Device: Room Air  Infant feeding assessment IDFTS - Readiness: 1 IDFTS - Quality: 1   Maternal data: G2P2002 Vaginal, Spontaneous Pumping frequency: 5 times/24 hours Pumped volume: 10 mL (10-15 ml) Flange Size: 24 (Resized to # 24 flanges on 10/05/2023) Hands-free pumping top sizes: Large Wallace Cullens)  WIC Program: No WIC Referral Sent?: No Pump: Personal, Hands Free (Mom Cozy (through insurance))  ASSESSMENT Infant: Feeding Status: Ad lib Feeding method: Bottle Nipple Type: Dr. Irving Burton Preemie  Maternal: Milk volume: Low Breast are still soft but feel heavier, no S/S of engorgement at this time  INTERVENTIONS/PLAN Interventions: Interventions: Breast feeding basics reviewed; Coconut oil; DEBP; Education Discharge Education: Engorgement and breast care Tools: Pump; Flanges; Hands-free pumping top; Coconut oil Pump Education:  Setup, frequency, and cleaning; Milk Storage  Plan: Encouraged pumping every 3 hours; ideally 8 pumping sessions/24 hours Breast massage and hand expression were also encouraged STS whenever possible   No other support person at this time. All questions and concerns answered, family to contact [facility] services PRN.  Consult Status: NICU follow-up NICU Follow-up type: Baby's discharge   Kamariya Blevens Venetia Constable 10/05/2023, 11:48 AM

## 2023-10-05 NOTE — Assessment & Plan Note (Addendum)
Sudden onset of shortness of breath associated with chest x-ray finding of bilateral patchy lung infiltrates.  Blood cultures ordered.  Pending COVID-19 testing, respiratory viral panel is negative.  We will start treatment with ceftriaxone and azithromycin. No fever or WBC. Associated with acute hypoxemic respiratory failure, although pulse oximetry is a little bit hard to infer because of patient wearing nasal gel.  Will request respiratory therapy evaluation to consider ear probe.  Uptitrate oxygen to patient comfort.  Differential includes fluid overload (patient on 20 mg lasix at home) given marked edema of lower extremity as well as finding of elevated BNP.  I will empirically give 1 dose of Lasix.  Patient troponin elevation is likely due to hypoxemia, however given the overall clinical picture we will pursue an echo as well.  Will also get lower extremity Dopplers to rule out DVT.

## 2023-10-05 NOTE — Assessment & Plan Note (Addendum)
PRN labetalol ordered per OBGYN. Discussed with fellow Dr. Leanora Cover. Concideration of pre-ecclempsia per them. They may order magnesium sulfate ppx.  10-06-2023 defer to [facility] for treatment.

## 2023-10-05 NOTE — MAU Note (Signed)
Incentive spirometer education given, pt verbalized understanding and will try inhaling and exhaling more adequally.

## 2023-10-06 ENCOUNTER — Inpatient Hospital Stay (HOSPITAL_COMMUNITY): Payer: Managed Care, Other (non HMO)

## 2023-10-06 ENCOUNTER — Other Ambulatory Visit (HOSPITAL_COMMUNITY): Payer: Self-pay

## 2023-10-06 DIAGNOSIS — Z8759 Personal history of other complications of pregnancy, childbirth and the puerperium: Secondary | ICD-10-CM | POA: Diagnosis not present

## 2023-10-06 DIAGNOSIS — J9601 Acute respiratory failure with hypoxia: Secondary | ICD-10-CM | POA: Diagnosis not present

## 2023-10-06 DIAGNOSIS — J189 Pneumonia, unspecified organism: Secondary | ICD-10-CM | POA: Diagnosis not present

## 2023-10-06 LAB — CBC
HCT: 35.7 % — ABNORMAL LOW (ref 36.0–46.0)
Hemoglobin: 11.8 g/dL — ABNORMAL LOW (ref 12.0–15.0)
MCH: 28.2 pg (ref 26.0–34.0)
MCHC: 33.1 g/dL (ref 30.0–36.0)
MCV: 85.2 fL (ref 80.0–100.0)
Platelets: 270 10*3/uL (ref 150–400)
RBC: 4.19 MIL/uL (ref 3.87–5.11)
RDW: 14.4 % (ref 11.5–15.5)
WBC: 8.6 10*3/uL (ref 4.0–10.5)
nRBC: 0 % (ref 0.0–0.2)

## 2023-10-06 LAB — PHOSPHORUS: Phosphorus: 6.1 mg/dL — ABNORMAL HIGH (ref 2.5–4.6)

## 2023-10-06 LAB — COMPREHENSIVE METABOLIC PANEL
ALT: 24 U/L (ref 0–44)
AST: 15 U/L (ref 15–41)
Albumin: 2.5 g/dL — ABNORMAL LOW (ref 3.5–5.0)
Alkaline Phosphatase: 96 U/L (ref 38–126)
Anion gap: 8 (ref 5–15)
BUN: 5 mg/dL — ABNORMAL LOW (ref 6–20)
CO2: 26 mmol/L (ref 22–32)
Calcium: 7.2 mg/dL — ABNORMAL LOW (ref 8.9–10.3)
Chloride: 103 mmol/L (ref 98–111)
Creatinine, Ser: 0.48 mg/dL (ref 0.44–1.00)
GFR, Estimated: >60 mL/min (ref >60)
Glucose, Bld: 107 mg/dL — ABNORMAL HIGH (ref 70–99)
Potassium: 3.6 mmol/L (ref 3.5–5.1)
Sodium: 137 mmol/L (ref 135–145)
Total Bilirubin: 0.5 mg/dL (ref 0.0–1.2)
Total Protein: 5.6 g/dL — ABNORMAL LOW (ref 6.5–8.1)

## 2023-10-06 LAB — APTT: aPTT: 25 s (ref 24–36)

## 2023-10-06 LAB — MAGNESIUM: Magnesium: 4.4 mg/dL — ABNORMAL HIGH (ref 1.7–2.4)

## 2023-10-06 LAB — PROTIME-INR
INR: 1.1 (ref 0.8–1.2)
Prothrombin Time: 14.1 s (ref 11.4–15.2)

## 2023-10-06 LAB — STREP PNEUMONIAE URINARY ANTIGEN: Strep Pneumo Urinary Antigen: NEGATIVE

## 2023-10-06 LAB — BRAIN NATRIURETIC PEPTIDE: B Natriuretic Peptide: 53.9 pg/mL (ref 0.0–100.0)

## 2023-10-06 LAB — PROCALCITONIN: Procalcitonin: <0.1 ng/mL

## 2023-10-06 MED ORDER — AZITHROMYCIN 500 MG PO TABS
500.0000 mg | ORAL_TABLET | Freq: Every day | ORAL | 0 refills | Status: AC
  Filled 2023-10-06: qty 4, 4d supply, fill #0

## 2023-10-06 MED ORDER — IOHEXOL 350 MG/ML SOLN
75.0000 mL | Freq: Once | INTRAVENOUS | Status: AC | PRN
  Administered 2023-10-06: 75 mL via INTRAVENOUS

## 2023-10-06 MED ORDER — LEVALBUTEROL HCL 0.63 MG/3ML IN NEBU
0.6300 mg | INHALATION_SOLUTION | Freq: Four times a day (QID) | RESPIRATORY_TRACT | Status: DC | PRN
  Administered 2023-10-06: 0.63 mg via RESPIRATORY_TRACT
  Filled 2023-10-06 (×3): qty 3

## 2023-10-06 MED ORDER — AZITHROMYCIN 250 MG PO TABS: 500.0000 mg | ORAL_TABLET | Freq: Every day | ORAL | Status: DC

## 2023-10-06 MED ORDER — CEFADROXIL 500 MG PO CAPS
500.0000 mg | ORAL_CAPSULE | Freq: Two times a day (BID) | ORAL | 0 refills | Status: AC
  Filled 2023-10-06: qty 14, 7d supply, fill #0

## 2023-10-06 MED ORDER — ALBUTEROL SULFATE HFA 108 (90 BASE) MCG/ACT IN AERS
2.0000 | INHALATION_SPRAY | Freq: Four times a day (QID) | RESPIRATORY_TRACT | 0 refills | Status: DC | PRN
  Filled 2023-10-06: qty 18, 25d supply, fill #0

## 2023-10-06 MED ORDER — NIFEDIPINE ER 30 MG PO TB24
30.0000 mg | ORAL_TABLET | Freq: Every day | ORAL | 0 refills | Status: DC
  Filled 2023-10-06: qty 30, 30d supply, fill #0

## 2023-10-06 MED ORDER — SODIUM CHLORIDE 0.9 % IV SOLN
2.0000 g | INTRAVENOUS | Status: DC
  Administered 2023-10-06: 2 g via INTRAVENOUS
  Filled 2023-10-06: qty 20

## 2023-10-06 NOTE — Plan of Care (Signed)
  Problem: Education: Goal: Knowledge of disease or condition will improve Outcome: Adequate for Discharge Goal: Knowledge of the prescribed therapeutic regimen will improve Outcome: Adequate for Discharge   Problem: Fluid Volume: Goal: Peripheral tissue perfusion will improve Outcome: Adequate for Discharge   Problem: Clinical Measurements: Goal: Complications related to disease process, condition or treatment will be avoided or minimized Outcome: Adequate for Discharge   Problem: Education: Goal: Knowledge of General Education information will improve Description: Including pain rating scale, medication(s)/side effects and non-pharmacologic comfort measures Outcome: Adequate for Discharge   Problem: Health Behavior/Discharge Planning: Goal: Ability to manage health-related needs will improve Outcome: Adequate for Discharge   Problem: Clinical Measurements: Goal: Ability to maintain clinical measurements within normal limits will improve Outcome: Adequate for Discharge Goal: Will remain free from infection Outcome: Adequate for Discharge Goal: Diagnostic test results will improve Outcome: Adequate for Discharge Goal: Respiratory complications will improve Outcome: Adequate for Discharge Goal: Cardiovascular complication will be avoided Outcome: Adequate for Discharge   Problem: Activity: Goal: Risk for activity intolerance will decrease Outcome: Adequate for Discharge   Problem: Nutrition: Goal: Adequate nutrition will be maintained Outcome: Adequate for Discharge   Problem: Coping: Goal: Level of anxiety will decrease Outcome: Adequate for Discharge   Problem: Elimination: Goal: Will not experience complications related to bowel motility Outcome: Adequate for Discharge Goal: Will not experience complications related to urinary retention Outcome: Adequate for Discharge   Problem: Pain Managment: Goal: General experience of comfort will improve and/or be  controlled Outcome: Adequate for Discharge   Problem: Safety: Goal: Ability to remain free from injury will improve Outcome: Adequate for Discharge   Problem: Skin Integrity: Goal: Risk for impaired skin integrity will decrease Outcome: Adequate for Discharge

## 2023-10-06 NOTE — Lactation Note (Signed)
This note was copied from a Pamela's chart.  NICU Lactation Consultation Note  Patient Name: Pamela Klein [id number] Date: 10/06/2023 Age:33 days  Reason for consult: Follow-up assessment; NICU Pamela; Term; Maternal endocrine disorder; Other (Comment) (Re-admission to OB[facility]) Type of Endocrine Disorder?: Diabetes (A1GDM)  SUBJECTIVE Visited with family of 76 days old FT NICU female; Pamela Klein reported she's been pumping and dumping the last 24 hours due to having an IV contrast CT scan. She just started collecting her milk this afternoon for Pamela "[name]"; her milk came in. He's been mostly on Similac 20 calorie formula due to maternal status. Provided two additional pumping bands in size M and L per her request. Pamela Klein is going home today with his grandparents, Pamela Klein will still stay at the hospital as in-patient for another day or two. She politely declined a referral to [facility] OP as she has already scheduled all those services through her doula. Grandparents present. All questions and concerns answered, family to contact [facility] services PRN.  OBJECTIVE Infant data: Mother's Current Feeding Choice: Breast Milk and Formula  O2 Device: Room Air  Infant feeding assessment IDFTS - Readiness: 1 IDFTS - Quality: 2   Maternal data: G2P2002 Vaginal, Spontaneous Pumping frequency: 5 times/24 hours Pumped volume: 90 mL (90-120 ml) Flange Size: 24 (Resized to # 24 flanges on 10/05/2023) Hands-free pumping top sizes: Large Wallace Cullens)  WIC Program: No WIC Referral Sent?: No Pump: Personal, Hands Free (Mom Cozy (through insurance))  ASSESSMENT Infant: Feeding Status: Ad lib Feeding method: Bottle Nipple Type: Nfant Standard Flow (white)  Maternal: Milk volume: Normal  INTERVENTIONS/PLAN Interventions: Interventions: Breast feeding basics reviewed; Coconut oil; DEBP; Education Discharge Education: Outpatient recommendation Tools: Pump; Flanges; Hands-free pumping top; Coconut oil Pump  Education: Setup, frequency, and cleaning; Milk Storage  Plan: Consult Status: Complete NICU Follow-up type: Pamela's discharge   Pamela Klein Pamela Klein 10/06/2023, 3:37 PM

## 2023-10-06 NOTE — Subjective & Objective (Signed)
Pt seen and examined. Pt states her baby is being released by NICU today. Weaned to RA. Walking in hallways without any difficulty. CTPA negative for PE.

## 2023-10-06 NOTE — Assessment & Plan Note (Signed)
10-06-2023 initial RA sats of 87%. Placed on supplemental O2. Did receive 1 dose of IV lasix. Weaned to RA. CTPA negative for PE.  Resolved.

## 2023-10-06 NOTE — Hospital Course (Addendum)
HPI: Pamela Klein is a 33 y.o. female with medical history significant of vaginal delivery on October 01, 2023.  Patient is G2 P1-0-0-1. Child in NICU due to noted lack of spontaneous resp effort. S.p code . Patient had 1st degree laceration cervics x/p repair.   Patient was discharged home on October 03, 2023.  Patient states that she was doing well till she went to bed last evening on October 04, 2023.  Patient reports waking up in the middle of the night around 1 AM with coughing, no expectoration.  Feeling shortness of breath and tightness of chest.  Patient denies any history of asthma or COPD.  Patient symptom of shortness of breath was not relieved therefore patient came to the ER.  Patient denies any fever.  Patient reports she has had bilateral lower extremity swelling since she had her delivery done with epidural anesthesia.  Although the lower extremity swelling apparently has been improving since then.   Patient was initially evaluated in the maternal T ER.  Patient was found to be hypoxic, chest x-ray shows bilateral infiltrates.  Patient was transiently maintained on nonrebreather mask.  Now since has been down titrated to 3 L/min of supplementary oxygen. medical evaluation is sought.   Patient denies any palpitations syncope or similar episodes in the past.  Significant Events: Admitted 10/05/2023 pneumonia and acute respiratory failure with hypoxia   Significant Labs: WBC 9.3, Hgb 11.4, plt 248 Na 140, K 4.0, BUN 10, Scr 0.55 D-dimer 3.67  Significant Imaging Studies: CXR Multilobar bilateral bronchopneumonia redemonstrated, likely slightly worsened, particularly in the right upper lobe. CTPA Evaluation for pulmonary embolism is markedly limited due to suboptimal contrast enhancement. There is no embolism in the main pulmonary arteries however, consider repeat examination if clinically indicated as lobar and segmental pulmonary arteries are not evaluated on this exam. 2.  Multilobar pneumonia asymmetrically involving the right upper lobe, as discussed above. There are also bilateral small pleural effusions.  Antibiotic Therapy: Anti-infectives (From admission, onward)    Start     Dose/Rate Route Frequency Ordered Stop   10/07/23 1000  azithromycin (ZITHROMAX) tablet 500 mg        500 mg Oral Daily 10/06/23 1109     10/06/23 1000  azithromycin (ZITHROMAX) 500 mg in sodium chloride 0.9 % 250 mL IVPB  Status:  Discontinued        500 mg 250 mL/hr over 60 Minutes Intravenous Every 24 hours 10/05/23 1009 10/06/23 1109   10/06/23 0800  cefTRIAXone (ROCEPHIN) 2 g in sodium chloride 0.9 % 100 mL IVPB        2 g 200 mL/hr over 30 Minutes Intravenous Every 24 hours 10/06/23 0501     10/05/23 1015  cefTRIAXone (ROCEPHIN) 1 g in sodium chloride 0.9 % 100 mL IVPB  Status:  Discontinued        1 g 200 mL/hr over 30 Minutes Intravenous  Once 10/05/23 1008 10/05/23 1018   10/05/23 1015  azithromycin (ZITHROMAX) 500 mg in sodium chloride 0.9 % 250 mL IVPB        500 mg 250 mL/hr over 60 Minutes Intravenous  Once 10/05/23 1009 10/05/23 1226   10/05/23 0900  azithromycin (ZITHROMAX) 500 mg in sodium chloride 0.9 % 250 mL IVPB  Status:  Discontinued        500 mg 250 mL/hr over 60 Minutes Intravenous Every 24 hours 10/05/23 0750 10/05/23 1009   10/05/23 0800  cefTRIAXone (ROCEPHIN) 1 g in sodium chloride 0.9 % 100  mL IVPB  Status:  Discontinued        1 g 200 mL/hr over 30 Minutes Intravenous Every 24 hours 10/05/23 0750 10/06/23 0501       Procedures:   Consultants: OB

## 2023-10-06 NOTE — Progress Notes (Addendum)
Acute hypoxic respiratory failure secondary to community-acquired pneumonia RN reported that patient's O2 sat 93% on 4 L oxygen however once the oxygen turned off patient O2 sat dropped to 84 to 88%.  Per chart review patient was admitted for community-acquired pneumonia initially patient was on nonrebreather high flow oxygen 10 L which has been gradually weaned down.  Given patient oxygen saturation is dropping and patient's RN reported that patient has diminished bilateral lower lung field sounds obtaining stat chest x-ray.  Initial chest x-ray showed multifocal bilateral bronchopneumonia. -Plan to continue nasal cannula oxygen to keep O2 sat above 92% - Continue Xopenex every 4 hour as needed for wheezing or shortness of breath. -Continue broad-spectrum antibiotic coverage with ceftriaxone (increased ceftriaxone dose 1 g to 2 g) and continue azithromycin 500 mg daily. - Respiratory 20 panel unremarkable.  Pending Pro-Cal, BNP, CBC and CMP. -Given the nature of sudden onset of shortness of breath which is mostly explained by the pneumonia found on chest x-ray however given patient has significantly elevated D-dimer 3.67 planning to obtain CTA chest to rule out PE.  Bilateral lower extremities ultrasound ruled out any DVT. -I have spoke with obstetrician Dr. Vergie Living who stated that we can get an CTA chest but recommended that she cannot breast-feed for next 24 hours and instructed to pump the breast milk out for next 24 hours..  -Repeat chest x-ray showing multilobar bilateral bronchopneumonia slightly worsened particularly in the right upper lobe.  Plan to continue IV antibiotic ceftriaxone azithromycin as mentioned above.  Continue supportive care. Pending CTA chest.   Tereasa Coop, MD [facility] 10/06/2023, 5:03 AM

## 2023-10-06 NOTE — Discharge Summary (Signed)
[facility] Phys[facility] Discharge Summary   Patient name: Pamela Klein  Admit date:     10/05/2023  Discharge date: 10/06/2023  Attending Physician: Nolberto Hanlon [[id number]]  Discharge Physician: Carollee Herter   PCP: Osborn Coho, MD  Admitted From: Home  Disposition:  Home  Recommendations for Outpatient Follow-up:  Follow up with PCP in 1-2 weeks Follow up with OB as scheduled Please follow up on the following pending results: blood cultures  Home Health:No Equipment/Devices: None  Discharge Condition:Stable CODE STATUS:FULL Diet recommendation: Regular Fluid Restriction: None  Hospital Summary: HPI: Pamela Klein is a 33 y.o. female with medical history significant of vaginal delivery on October 01, 2023.  Patient is G2 P1-0-0-1. Child in NICU due to noted lack of spontaneous resp effort. S.p code . Patient had 1st degree laceration cervics x/p repair.   Patient was discharged home on October 03, 2023.  Patient states that she was doing well till she went to bed last evening on October 04, 2023.  Patient reports waking up in the middle of the night around 1 AM with coughing, no expectoration.  Feeling shortness of breath and tightness of chest.  Patient denies any history of asthma or COPD.  Patient symptom of shortness of breath was not relieved therefore patient came to the ER.  Patient denies any fever.  Patient reports she has had bilateral lower extremity swelling since she had her delivery done with epidural anesthesia.  Although the lower extremity swelling apparently has been improving since then.   Patient was initially evaluated in the maternal T ER.  Patient was found to be hypoxic, chest x-ray shows bilateral infiltrates.  Patient was transiently maintained on nonrebreather mask.  Now since has been down titrated to 3 L/min of supplementary oxygen. medical evaluation is sought.   Patient denies any palpitations syncope or similar episodes in the  past.  Significant Events: Admitted 10/05/2023 pneumonia and acute respiratory failure with hypoxia   Significant Labs: WBC 9.3, Hgb 11.4, plt 248 Na 140, K 4.0, BUN 10, Scr 0.55 D-dimer 3.67  Significant Imaging Studies: CXR Multilobar bilateral bronchopneumonia redemonstrated, likely slightly worsened, particularly in the right upper lobe. CTPA Evaluation for pulmonary embolism is markedly limited due to suboptimal contrast enhancement. There is no embolism in the main pulmonary arteries however, consider repeat examination if clinically indicated as lobar and segmental pulmonary arteries are not evaluated on this exam. 2. Multilobar pneumonia asymmetrically involving the right upper lobe, as discussed above. There are also bilateral small pleural effusions.  Antibiotic Therapy: Anti-infectives (From admission, onward)    Start     Dose/Rate Route Frequency Ordered Stop   10/07/23 1000  azithromycin (ZITHROMAX) tablet 500 mg        500 mg Oral Daily 10/06/23 1109     10/06/23 1000  azithromycin (ZITHROMAX) 500 mg in sodium chloride 0.9 % 250 mL IVPB  Status:  Discontinued        500 mg 250 mL/hr over 60 Minutes Intravenous Every 24 hours 10/05/23 1009 10/06/23 1109   10/06/23 0800  cefTRIAXone (ROCEPHIN) 2 g in sodium chloride 0.9 % 100 mL IVPB        2 g 200 mL/hr over 30 Minutes Intravenous Every 24 hours 10/06/23 0501     10/05/23 1015  cefTRIAXone (ROCEPHIN) 1 g in sodium chloride 0.9 % 100 mL IVPB  Status:  Discontinued        1 g 200 mL/hr over 30 Minutes Intravenous  Once 10/05/23 1008 10/05/23 1018  10/05/23 1015  azithromycin (ZITHROMAX) 500 mg in sodium chloride 0.9 % 250 mL IVPB        500 mg 250 mL/hr over 60 Minutes Intravenous  Once 10/05/23 1009 10/05/23 1226   10/05/23 0900  azithromycin (ZITHROMAX) 500 mg in sodium chloride 0.9 % 250 mL IVPB  Status:  Discontinued        500 mg 250 mL/hr over 60 Minutes Intravenous Every 24 hours 10/05/23 0750 10/05/23 1009    10/05/23 0800  cefTRIAXone (ROCEPHIN) 1 g in sodium chloride 0.9 % 100 mL IVPB  Status:  Discontinued        1 g 200 mL/hr over 30 Minutes Intravenous Every 24 hours 10/05/23 0750 10/06/23 0501       Procedures:   Consultants: [facility] Course by Problem: * CAP (community acquired pneumonia) On admission, pt with Sudden onset of shortness of breath associated with chest x-ray finding of bilateral patchy lung infiltrates.  Blood cultures ordered.  Pending COVID-19 testing, respiratory viral panel is negative.  We will start treatment with ceftriaxone and azithromycin. No fever or WBC. Associated with acute hypoxemic respiratory failure, although pulse oximetry is a little bit hard to infer because of patient wearing nasal gel.  Will request respiratory therapy evaluation to consider ear probe.  Uptitrate oxygen to patient comfort. Differential includes fluid overload (patient on 20 mg lasix at home) given marked edema of lower extremity as well as finding of elevated BNP.  I will empirically give 1 dose of Lasix.  Patient troponin elevation is likely due to hypoxemia, however given the overall clinical picture we will pursue an echo as well.  Will also get lower extremity Dopplers to rule out DVT.  10-06-2023 CTPA negative for PE. Viral causes ruled out. Pt has been weaned to RA. Walking in hallways and feeling fine. Will DC to home with 7 day course of duricef and zithromax. Prn albuterol inhaler.  Acute respiratory failure with hypoxia (HCC) 10-06-2023 initial RA sats of 87%. Placed on supplemental O2. Did receive 1 dose of IV lasix. Weaned to RA. CTPA negative for PE.  Resolved.  Postpartum care following vaginal delivery I discussed case with Dr. Wyn Forster - no active OBGYN issues per her . Loko forward to there note. C.w. home rx per them.  History of gestational hypertension PRN labetalol ordered per OBGYN. Discussed with fellow Dr. Leanora Cover. Concideration of pre-ecclempsia  per them. They may order magnesium sulfate ppx.  10-06-2023 defer to [facility] for treatment.    Discharge Diagnoses:  Principal Problem:   CAP (community acquired pneumonia) Active Problems:   Acute respiratory failure with hypoxia (HCC)   History of gestational hypertension   Postpartum care following vaginal delivery   Discharge Instructions  Discharge Instructions     Call MD for:  difficulty breathing, headache or visual disturbances   Complete by: As directed    Call MD for:  extreme fatigue   Complete by: As directed    Call MD for:  hives   Complete by: As directed    Call MD for:  persistant dizziness or light-headedness   Complete by: As directed    Call MD for:  persistant nausea and vomiting   Complete by: As directed    Call MD for:  redness, tenderness, or signs of infection (pain, swelling, redness, odor or green/yellow discharge around incision site)   Complete by: As directed    Call MD for:  severe uncontrolled pain   Complete by:  As directed    Call MD for:  temperature >100.4   Complete by: As directed    Diet - low sodium heart healthy   Complete by: As directed    Discharge instructions   Complete by: As directed    1. Follow up with your primary care provider in 1-2 weeks 2. Follow up with your OB/GYN in 1 week   Increase activity slowly   Complete by: As directed       Allergies as of 10/06/2023   No Known Allergies      Medication List     TAKE these medications    albuterol 108 (90 Base) MCG/ACT inhaler Commonly known as: VENTOLIN HFA Inhale 2 puffs into the lungs every 6 (six) hours as needed for wheezing or shortness of breath.   aspirin EC 81 MG tablet Take 81 mg by mouth daily. Swallow whole.   azithromycin 500 MG tablet Commonly known as: ZITHROMAX Take 1 tablet (500 mg total) by mouth daily for 4 days. Start taking on: October 07, 2023   cefadroxil 500 MG capsule Commonly known as: DURICEF Take 1 capsule (500 mg total) by  mouth 2 (two) times daily for 7 days.   furosemide 20 MG tablet Commonly known as: LASIX Take 1 tablet (20 mg total) by mouth daily for 5 days.   gabapentin 300 MG capsule Commonly known as: NEURONTIN Take 1 capsule (300 mg total) by mouth 2 (two) times daily.   hydrOXYzine 25 MG tablet Commonly known as: ATARAX Take 1-2 tablets (25-50 mg total) by mouth 3 (three) times daily as needed for anxiety.   ibuprofen 800 MG tablet Commonly known as: ADVIL Take 1 tablet (800 mg total) by mouth 3 (three) times daily as needed for moderate pain (pain score 4-6), mild pain (pain score 1-3) or cramping.   multivitamin-prenatal 27-0.8 MG Tabs tablet Take 1 tablet by mouth daily at 12 noon.   NIFEdipine 30 MG 24 hr tablet Commonly known as: ADALAT CC Take 1 tablet (30 mg total) by mouth daily. Start taking on: October 07, 2023   ondansetron 4 MG tablet Commonly known as: Zofran Take 1 tablet (4 mg total) by mouth every 8 (eight) hours as needed for nausea or vomiting.   oxyCODONE 5 MG immediate release tablet Commonly known as: Oxy IR/ROXICODONE Take 1 tablet (5 mg total) by mouth every 4 (four) hours as needed for severe pain (pain score 7-10) or breakthrough pain (pain scale 4-7).   potassium chloride SA 20 MEQ tablet Commonly known as: KLOR-CON M Take 1 tablet (20 mEq total) by mouth daily for 5 days.   senna-docusate 8.6-50 MG tablet Commonly known as: Senokot-S Take 2 tablets by mouth at bedtime as needed for mild constipation or moderate constipation.        No Known Allergies  Discharge Exam: Vitals:   10/06/23 1000 10/06/23 1223  BP:  132/85  Pulse:  (!) 103  Resp:  19  Temp:  98.2 F (36.8 C)  SpO2: 99% 99%    Physical Exam Vitals and nursing note reviewed.  Constitutional:      General: She is not in acute distress.    Appearance: She is not toxic-appearing or diaphoretic.  HENT:     Head: Normocephalic and atraumatic.     Nose: Nose normal.  Eyes:      General: No scleral icterus. Cardiovascular:     Rate and Rhythm: Normal rate and regular rhythm.  Pulmonary:  Effort: Pulmonary effort is normal. No respiratory distress.     Breath sounds: Normal breath sounds.     Comments: Scattered basilar crackles Skin:    General: Skin is warm and dry.     Capillary Refill: Capillary refill takes less than 2 seconds.  Neurological:     General: No focal deficit present.     Mental Status: She is alert and oriented to person, place, and time.     Gait: Gait normal.     The results of significant diagnostics from this hospitalization (including imaging, microbiology, ancillary and laboratory) are listed below for reference.    Microbiology: Recent Results (from the past 240 hours)  Respiratory (~20 pathogens) panel by PCR     Status: None   Collection Time: 10/05/23  5:39 AM   Specimen: Nasopharyngeal Swab; Respiratory  Result Value Ref Range Status   Adenovirus NOT DETECTED NOT DETECTED Final   Coronavirus 229E NOT DETECTED NOT DETECTED Final    Comment: (NOTE) The Coronavirus on the Respiratory Panel, DOES NOT test for the novel  Coronavirus (2019 nCoV)    Coronavirus HKU1 NOT DETECTED NOT DETECTED Final   Coronavirus NL63 NOT DETECTED NOT DETECTED Final   Coronavirus OC43 NOT DETECTED NOT DETECTED Final   Metapneumovirus NOT DETECTED NOT DETECTED Final   Rhinovirus / Enterovirus NOT DETECTED NOT DETECTED Final   Influenza A NOT DETECTED NOT DETECTED Final   Influenza B NOT DETECTED NOT DETECTED Final   Parainfluenza Virus 1 NOT DETECTED NOT DETECTED Final   Parainfluenza Virus 2 NOT DETECTED NOT DETECTED Final   Parainfluenza Virus 3 NOT DETECTED NOT DETECTED Final   Parainfluenza Virus 4 NOT DETECTED NOT DETECTED Final   Respiratory Syncytial Virus NOT DETECTED NOT DETECTED Final   Bordetella pertussis NOT DETECTED NOT DETECTED Final   Bordetella Parapertussis NOT DETECTED NOT DETECTED Final   Chlamydophila pneumoniae NOT  DETECTED NOT DETECTED Final   Mycoplasma pneumoniae NOT DETECTED NOT DETECTED Final    Comment: Performed at [facility], [address] [place], [place] [address]  Culture, blood (Routine X 2) w Reflex to ID Panel     Status: None (Preliminary result)   Collection Time: 10/05/23  9:07 AM   Specimen: BLOOD  Result Value Ref Range Status   Specimen Description BLOOD LEFT ANTECUBITAL  Final   Special Requests   Final    BOTTLES DRAWN AEROBIC AND ANAEROBIC Blood Culture results may not be optimal due to an inadequate volume of blood received in culture bottles   Culture   Final    NO GROWTH < 24 HOURS Performed at [facility], [address], [place], [place] [address]    Report Status PENDING  Incomplete  Culture, blood (Routine X 2) w Reflex to ID Panel     Status: None (Preliminary result)   Collection Time: 10/05/23  9:07 AM   Specimen: BLOOD LEFT HAND  Result Value Ref Range Status   Specimen Description BLOOD LEFT HAND  Final   Special Requests   Final    BOTTLES DRAWN AEROBIC AND ANAEROBIC Blood Culture results may not be optimal due to an inadequate volume of blood received in culture bottles   Culture   Final    NO GROWTH < 24 HOURS Performed at [facility], [address] [place], [place] [address]    Report Status PENDING  Incomplete  SARS Coronavirus 2 by RT PCR (hospital order, performed in [facility] hospital lab) *  cepheid single result test* Anterior Nasal Swab     Status: None   Collection Time: 10/05/23  2:08 PM   Specimen: Anterior Nasal Swab  Result Value Ref Range Status   SARS Coronavirus 2 by RT PCR NEGATIVE NEGATIVE Final    Comment: Performed at [facility], [address], [place], [place] [address]     Labs: BNP (last 3 results) Recent Labs    10/05/23 0541 10/06/23 0518  BNP 168.5* 53.9   Basic Metabolic Panel: Recent Labs  Lab 10/05/23 0541 10/05/23 1828 10/06/23 0518  NA 140  --  137  K 4.0  --  3.6  CL 104   --  103  CO2 26  --  26  GLUCOSE 95  --  107*  BUN 10  --  5*  CREATININE 0.55  --  0.48  CALCIUM 8.6*  --  7.2*  MG  --  4.1* 4.4*  PHOS 5.4*  --  6.1*   Liver Function Tests: Recent Labs  Lab 10/05/23 0541 10/06/23 0518  AST 19 15  ALT 26 24  ALKPHOS 98 96  BILITOT 0.3 0.5  PROT 5.5* 5.6*  ALBUMIN 2.4* 2.5*   CBC: Recent Labs  Lab 09/30/23 0032 10/02/23 0336 10/05/23 0541 10/06/23 0518  WBC 8.4 17.9* 9.3 8.6  NEUTROABS  --   --  6.8  --   HGB 11.9* 10.3* 11.4* 11.8*  HCT 35.7* 30.9* 34.6* 35.7*  MCV 86.4 88.3 86.9 85.2  PLT 188 174 248 270   BNP: Recent Labs  Lab 10/05/23 0541 10/06/23 0518  BNP 168.5* 53.9   CBG: Recent Labs  Lab 09/30/23 1351 09/30/23 1807 09/30/23 2226 10/01/23 0519 10/01/23 1004  GLUCAP 110* 96 150* 108* 104*   D-Dimer Recent Labs    10/05/23 0541  DDIMER 3.67*   Urinalysis    Component Value Date/Time   COLORURINE YELLOW 05/07/2023 1857   APPEARANCEUR CLEAR 05/07/2023 1857   LABSPEC 1.027 05/07/2023 1857   PHURINE 6.0 05/07/2023 1857   GLUCOSEU NEGATIVE 05/07/2023 1857   HGBUR NEGATIVE 05/07/2023 1857   BILIRUBINUR NEGATIVE 05/07/2023 1857   KETONESUR NEGATIVE 05/07/2023 1857   PROTEINUR NEGATIVE 05/07/2023 1857   UROBILINOGEN 0.2 09/09/2014 0913   NITRITE NEGATIVE 05/07/2023 1857   LEUKOCYTESUR NEGATIVE 05/07/2023 1857   Sepsis Labs Recent Labs  Lab 09/30/23 0032 10/02/23 0336 10/05/23 0541 10/06/23 0518  WBC 8.4 17.9* 9.3 8.6   Microbiology Recent Results (from the past 240 hours)  Respiratory (~20 pathogens) panel by PCR     Status: None   Collection Time: 10/05/23  5:39 AM   Specimen: Nasopharyngeal Swab; Respiratory  Result Value Ref Range Status   Adenovirus NOT DETECTED NOT DETECTED Final   Coronavirus 229E NOT DETECTED NOT DETECTED Final    Comment: (NOTE) The Coronavirus on the Respiratory Panel, DOES NOT test for the novel  Coronavirus (2019 nCoV)    Coronavirus HKU1 NOT DETECTED NOT  DETECTED Final   Coronavirus NL63 NOT DETECTED NOT DETECTED Final   Coronavirus OC43 NOT DETECTED NOT DETECTED Final   Metapneumovirus NOT DETECTED NOT DETECTED Final   Rhinovirus / Enterovirus NOT DETECTED NOT DETECTED Final   Influenza A NOT DETECTED NOT DETECTED Final   Influenza B NOT DETECTED NOT DETECTED Final   Parainfluenza Virus 1 NOT DETECTED NOT DETECTED Final   Parainfluenza Virus 2 NOT DETECTED NOT DETECTED Final   Parainfluenza Virus 3 NOT DETECTED NOT DETECTED Final   Parainfluenza Virus 4 NOT  DETECTED NOT DETECTED Final   Respiratory Syncytial Virus NOT DETECTED NOT DETECTED Final   Bordetella pertussis NOT DETECTED NOT DETECTED Final   Bordetella Parapertussis NOT DETECTED NOT DETECTED Final   Chlamydophila pneumoniae NOT DETECTED NOT DETECTED Final   Mycoplasma pneumoniae NOT DETECTED NOT DETECTED Final    Comment: Performed at [facility], [address] [place], [place] [address]  Culture, blood (Routine X 2) w Reflex to ID Panel     Status: None (Preliminary result)   Collection Time: 10/05/23  9:07 AM   Specimen: BLOOD  Result Value Ref Range Status   Specimen Description BLOOD LEFT ANTECUBITAL  Final   Special Requests   Final    BOTTLES DRAWN AEROBIC AND ANAEROBIC Blood Culture results may not be optimal due to an inadequate volume of blood received in culture bottles   Culture   Final    NO GROWTH < 24 HOURS Performed at [facility], [address], [place], [place] [address]    Report Status PENDING  Incomplete  Culture, blood (Routine X 2) w Reflex to ID Panel     Status: None (Preliminary result)   Collection Time: 10/05/23  9:07 AM   Specimen: BLOOD LEFT HAND  Result Value Ref Range Status   Specimen Description BLOOD LEFT HAND  Final   Special Requests   Final    BOTTLES DRAWN AEROBIC AND ANAEROBIC Blood Culture results may not be optimal due to an inadequate volume of blood received in culture bottles   Culture   Final    NO GROWTH <  24 HOURS Performed at [facility], [address], [place], [place] [address]    Report Status PENDING  Incomplete  SARS Coronavirus 2 by RT PCR (hospital order, performed in [facility] hospital lab) *cepheid single result test* Anterior Nasal Swab     Status: None   Collection Time: 10/05/23  2:08 PM   Specimen: Anterior Nasal Swab  Result Value Ref Range Status   SARS Coronavirus 2 by RT PCR NEGATIVE NEGATIVE Final    Comment: Performed at [facility], [address] [place], [place] [address]    Procedures/Studies: CT Angio Chest Pulmonary Embolism (PE) W or WO Contrast Result Date: 10/06/2023 CLINICAL DATA:  Pulmonary embolism (PE) suspected, low to intermediate prob, positive D-dimer Elevated D-dimer. Need to rule out PE. Shortness of breath. EXAM: CT ANGIOGRAPHY CHEST WITH CONTRAST TECHNIQUE: Multidetector CT imaging of the chest was performed using the standard protocol during bolus administration of intravenous contrast. Multiplanar CT image reconstructions and MIPs were obtained to evaluate the vascular anatomy. RADIATION DOSE REDUCTION: This exam was performed according to the departmental dose-optimization program which includes automated exposure control, adjustment of the mA and/or kV according to patient size and/or use of iterative reconstruction technique. CONTRAST:  75mL OMNIPAQUE IOHEXOL 350 MG/ML SOLN COMPARISON:  CT scan chest from 05/01/2013. FINDINGS: Cardiovascular: Evaluation of pulmonary embolism beyond the main pulmonary arteries is limited due to suboptimal contrast-enhancement. There is no embolism in the main pulmonary arteries. However, consider repeat examination if clinically indicated as lobar and segmental pulmonary arteries are not evaluated on this exam. Normal cardiac size. No pericardial effusion. No aortic aneurysm. Mediastinum/Nodes: Visualized thyroid gland appears grossly unremarkable. No solid / cystic mediastinal masses. The esophagus is  nondistended precluding optimal assessment. No axillary, mediastinal or hilar lymphadenopathy by size criteria. Lungs/Pleura: The central tracheo-bronchial tree is patent. There are diffuse solid and ground-glass opacities with  associated interlobular septal thickening (known as crazy paving pattern) throughout bilateral lungs with asymmetric moderate involvement of the right upper lobe, mild involvement of the left upper lobe anterior segment and right lower lobe and patchy involvement of remaining lobes. No pneumothorax. There are bilateral small pleural effusions with associated subsegmental atelectatic changes in the bilateral lower lobes. Findings favor multilobar pneumonia. No suspicious lung nodule. Upper Abdomen: Visualized upper abdominal viscera within normal limits. Musculoskeletal: The visualized soft tissues of the chest wall are grossly unremarkable. No suspicious osseous lesions. Review of the MIP images confirms the above findings. IMPRESSION: 1. Evaluation for pulmonary embolism is markedly limited due to suboptimal contrast enhancement. There is no embolism in the main pulmonary arteries however, consider repeat examination if clinically indicated as lobar and segmental pulmonary arteries are not evaluated on this exam. 2. Multilobar pneumonia asymmetrically involving the right upper lobe, as discussed above. There are also bilateral small pleural effusions. 3. Multiple other nonacute observations, as described above. Electronically Signed   By: Jules Schick M.D.   On: 10/06/2023 08:56   DG CHEST PORT 1 VIEW Result Date: 10/06/2023 CLINICAL DATA:  33 year old female with history of shortness of breath. EXAM: PORTABLE CHEST 1 VIEW COMPARISON:  Chest x-ray 10/05/2023. FINDINGS: Lung volumes are normal. Diffuse interstitial prominence, widespread peribronchial cuffing, and patchy asymmetrically distributed ill-defined airspace disease scattered throughout the lungs bilaterally, most severe on the  right, particularly in the right upper lobe. Overall, aeration appears similar to slightly worsened compared to the prior examination. No definite pleural effusions. No pneumothorax. Pulmonary vasculature does not appear engorged. Heart size appears borderline to mildly enlarged, likely accentuated by portable AP technique. IMPRESSION: 1. Multilobar bilateral bronchopneumonia redemonstrated, likely slightly worsened, particularly in the right upper lobe. Electronically Signed   By: Trudie Reed M.D.   On: 10/06/2023 06:15   VAS [place] LOWER EXTREMITY VENOUS (DVT) Result Date: 10/05/2023  Lower Venous DVT Study Patient Name:  PATTRICIA WEIHER  Date of Exam:   10/05/2023 Medical Rec #: [id number]          Accession #:    [id number] Date of Birth: 16-Jun-1991          Patient Gender: F Patient Age:   77 years Exam Location:  [facility] Procedure:      VAS [place] LOWER EXTREMITY VENOUS (DVT) Referring Phys: [facility] GOEL --------------------------------------------------------------------------------  Indications: Swelling, Edema, SOB, and recent postpartum.  Comparison Study: No prior exam. Performing Technologist: Fernande Bras  Examination Guidelines: A complete evaluation includes B-mode imaging, spectral Doppler, color Doppler, and power Doppler as needed of all accessible portions of each vessel. Bilateral testing is considered an integral part of a complete examination. Limited examinations for reoccurring indications may be performed as noted. The reflux portion of the exam is performed with the patient in reverse Trendelenburg.  +---------+---------------+---------+-----------+----------+--------------+ RIGHT    CompressibilityPhasicitySpontaneityPropertiesThrombus Aging +---------+---------------+---------+-----------+----------+--------------+ CFV      Full           Yes      Yes                                 +---------+---------------+---------+-----------+----------+--------------+  SFJ      Full           Yes      Yes                                 +---------+---------------+---------+-----------+----------+--------------+  FV Prox  Full                                                        +---------+---------------+---------+-----------+----------+--------------+ FV Mid   Full                                                        +---------+---------------+---------+-----------+----------+--------------+ FV DistalFull                                                        +---------+---------------+---------+-----------+----------+--------------+ PFV      Full                                                        +---------+---------------+---------+-----------+----------+--------------+ POP      Full           Yes      Yes                                 +---------+---------------+---------+-----------+----------+--------------+ PTV      Full                                                        +---------+---------------+---------+-----------+----------+--------------+ PERO     Full                                                        +---------+---------------+---------+-----------+----------+--------------+ Rouleaux flow noted in bilateral popliteal veins.  +---------+---------------+---------+-----------+----------+--------------+ LEFT     CompressibilityPhasicitySpontaneityPropertiesThrombus Aging +---------+---------------+---------+-----------+----------+--------------+ CFV      Full           Yes      Yes                                 +---------+---------------+---------+-----------+----------+--------------+ SFJ      Full           Yes      Yes                                 +---------+---------------+---------+-----------+----------+--------------+ FV Prox  Full                                                         +---------+---------------+---------+-----------+----------+--------------+  FV Mid   Full                                                        +---------+---------------+---------+-----------+----------+--------------+ FV DistalFull                                                        +---------+---------------+---------+-----------+----------+--------------+ PFV      Full                                                        +---------+---------------+---------+-----------+----------+--------------+ POP      Full           Yes      Yes                                 +---------+---------------+---------+-----------+----------+--------------+ PTV      Full                                                        +---------+---------------+---------+-----------+----------+--------------+ PERO     Full                                                        +---------+---------------+---------+-----------+----------+--------------+ Rouleaux flow noted in bilateral popliteal veins.    Summary: BILATERAL: - No evidence of deep vein thrombosis seen in the lower extremities, bilaterally. -No evidence of popliteal cyst, bilaterally.   *See table(s) above for measurements and observations. Electronically signed by Heath Lark on 10/05/2023 at 4:15:32 PM.    Final    ECHOCARDIOGRAM COMPLETE Result Date: 10/05/2023    ECHOCARDIOGRAM REPORT   Patient Name:   SHEY YOTT Date of Exam: 10/05/2023 Medical Rec #:  [id number]         Height:       64.0 in Accession #:    [id number]        Weight:       221.6 lb Date of Birth:  11-05-1990         BSA:          2.044 m Patient Age:    32 years          BP:           150/84 mmHg Patient Gender: F                 HR:           80 bpm. Exam Location:  Inpatient Procedure: 2D Echo, Cardiac Doppler and Color Doppler Indications:    CHF-Acute Systolic I50.21  History:  Patient has no prior history of Echocardiogram examinations.   Sonographer:    Darlys Gales Referring Phys: [id number] [facility] GOEL IMPRESSIONS  1. Left ventricular ejection fraction, by estimation, is 55 to 60%. The left ventricle has normal function. Left ventricular endocardial border not optimally defined to evaluate regional wall motion. Left ventricular diastolic parameters are indeterminate.  2. Right ventricular systolic function is normal. The right ventricular size is normal. Tricuspid regurgitation signal is inadequate for assessing PA pressure.  3. The mitral valve is normal in structure. No evidence of mitral valve regurgitation. No evidence of mitral stenosis.  4. The aortic valve is tricuspid. Aortic valve regurgitation is not visualized. No aortic stenosis is present.  5. IVC not visualized.  6. Technically difficult study with poor acoustic windows. FINDINGS  Left Ventricle: Left ventricular ejection fraction, by estimation, is 55 to 60%. The left ventricle has normal function. Left ventricular endocardial border not optimally defined to evaluate regional wall motion. The left ventricular internal cavity size was normal in size. There is no left ventricular hypertrophy. Left ventricular diastolic parameters are indeterminate. Right Ventricle: The right ventricular size is normal. No increase in right ventricular wall thickness. Right ventricular systolic function is normal. Tricuspid regurgitation signal is inadequate for assessing PA pressure. Left Atrium: Left atrial size was normal in size. Right Atrium: Right atrial size was normal in size. Pericardium: There is no evidence of pericardial effusion. Mitral Valve: The mitral valve is normal in structure. No evidence of mitral valve regurgitation. No evidence of mitral valve stenosis. Tricuspid Valve: The tricuspid valve is normal in structure. Tricuspid valve regurgitation is trivial. Aortic Valve: The aortic valve is tricuspid. Aortic valve regurgitation is not visualized. No aortic stenosis is present. Aortic  valve peak gradient measures 3.2 mmHg. Pulmonic Valve: The pulmonic valve was normal in structure. Pulmonic valve regurgitation is not visualized. Aorta: The aortic root is normal in size and structure. Venous: The inferior vena cava was not well visualized. IAS/Shunts: No atrial level shunt detected by color flow Doppler.  LEFT VENTRICLE PLAX 2D LVIDd:         4.30 cm LVIDs:         2.50 cm LV PW:         1.00 cm LV IVS:        1.00 cm LVOT diam:     1.80 cm LV SV:         40 LV SV Index:   20 LVOT Area:     2.54 cm  AORTIC VALVE AV Area (Vmax): 2.35 cm AV Vmax:        89.80 cm/s AV Peak Grad:   3.2 mmHg LVOT Vmax:      83.00 cm/s LVOT Vmean:     56.600 cm/s LVOT VTI:       0.159 m MITRAL VALVE MV Area (PHT): 4.49 cm    SHUNTS MV Decel Time: 169 msec    Systemic VTI:  0.16 m MV E velocity: 95.90 cm/s  Systemic Diam: 1.80 cm MV A velocity: 60.30 cm/s MV E/A ratio:  1.59 Dalton McleanMD Electronically signed by Wilfred Lacy Signature Date/Time: 10/05/2023/12:42:26 PM    Final    DG CHEST PORT 1 VIEW Result Date: 10/05/2023 CLINICAL DATA:  33 year old female with history of chest pain. EXAM: PORTABLE CHEST 1 VIEW COMPARISON:  Chest x-ray 11/15/2015. FINDINGS: Widespread areas of interstitial prominence, diffuse peribronchial cuffing and patchy multifocal airspace disease scattered throughout the lungs bilaterally. No pleural effusions. No pneumothorax. Pulmonary vasculature  does not appear engorged. Heart size is upper limits of normal. Upper mediastinal contours are within normal limits. IMPRESSION: 1. The appearance of the chest is concerning for multilobar bilateral bronchopneumonia. Electronically Signed   By: Trudie Reed M.D.   On: 10/05/2023 05:56    Time coordinating discharge: 45 mins  SIGNED:  Carollee Herter, DO [facility] 10/06/23, 1:50 PM

## 2023-10-06 NOTE — Progress Notes (Signed)
PROGRESS NOTE    Pamela Klein  WUJ:[id number] DOB: 03-29-1991 DOA: 10/05/2023 PCP: Osborn Coho, MD  Subjective: Pt seen and examined. Pt states her baby is being released by NICU today. Weaned to RA. Walking in hallways without any difficulty. CTPA negative for PE.   Hospital Course: HPI: Pamela Klein is a 33 y.o. female with medical history significant of vaginal delivery on October 01, 2023.  Patient is G2 P1-0-0-1. Child in NICU due to noted lack of spontaneous resp effort. S.p code . Patient had 1st degree laceration cervics x/p repair.   Patient was discharged home on October 03, 2023.  Patient states that she was doing well till she went to bed last evening on October 04, 2023.  Patient reports waking up in the middle of the night around 1 AM with coughing, no expectoration.  Feeling shortness of breath and tightness of chest.  Patient denies any history of asthma or COPD.  Patient symptom of shortness of breath was not relieved therefore patient came to the ER.  Patient denies any fever.  Patient reports she has had bilateral lower extremity swelling since she had her delivery done with epidural anesthesia.  Although the lower extremity swelling apparently has been improving since then.   Patient was initially evaluated in the maternal T ER.  Patient was found to be hypoxic, chest x-ray shows bilateral infiltrates.  Patient was transiently maintained on nonrebreather mask.  Now since has been down titrated to 3 L/min of supplementary oxygen. medical evaluation is sought.   Patient denies any palpitations syncope or similar episodes in the past.  Significant Events: Admitted 10/05/2023 pneumonia and acute respiratory failure with hypoxia   Significant Labs: WBC 9.3, Hgb 11.4, plt 248 Na 140, K 4.0, BUN 10, Scr 0.55 D-dimer 3.67  Significant Imaging Studies: CXR Multilobar bilateral bronchopneumonia redemonstrated, likely slightly worsened, particularly in the right  upper lobe. CTPA Evaluation for pulmonary embolism is markedly limited due to suboptimal contrast enhancement. There is no embolism in the main pulmonary arteries however, consider repeat examination if clinically indicated as lobar and segmental pulmonary arteries are not evaluated on this exam. 2. Multilobar pneumonia asymmetrically involving the right upper lobe, as discussed above. There are also bilateral small pleural effusions.  Antibiotic Therapy: Anti-infectives (From admission, onward)    Start     Dose/Rate Route Frequency Ordered Stop   10/07/23 1000  azithromycin (ZITHROMAX) tablet 500 mg        500 mg Oral Daily 10/06/23 1109     10/06/23 1000  azithromycin (ZITHROMAX) 500 mg in sodium chloride 0.9 % 250 mL IVPB  Status:  Discontinued        500 mg 250 mL/hr over 60 Minutes Intravenous Every 24 hours 10/05/23 1009 10/06/23 1109   10/06/23 0800  cefTRIAXone (ROCEPHIN) 2 g in sodium chloride 0.9 % 100 mL IVPB        2 g 200 mL/hr over 30 Minutes Intravenous Every 24 hours 10/06/23 0501     10/05/23 1015  cefTRIAXone (ROCEPHIN) 1 g in sodium chloride 0.9 % 100 mL IVPB  Status:  Discontinued        1 g 200 mL/hr over 30 Minutes Intravenous  Once 10/05/23 1008 10/05/23 1018   10/05/23 1015  azithromycin (ZITHROMAX) 500 mg in sodium chloride 0.9 % 250 mL IVPB        500 mg 250 mL/hr over 60 Minutes Intravenous  Once 10/05/23 1009 10/05/23 1226   10/05/23 0900  azithromycin (ZITHROMAX) 500  mg in sodium chloride 0.9 % 250 mL IVPB  Status:  Discontinued        500 mg 250 mL/hr over 60 Minutes Intravenous Every 24 hours 10/05/23 0750 10/05/23 1009   10/05/23 0800  cefTRIAXone (ROCEPHIN) 1 g in sodium chloride 0.9 % 100 mL IVPB  Status:  Discontinued        1 g 200 mL/hr over 30 Minutes Intravenous Every 24 hours 10/05/23 0750 10/06/23 0501       Procedures:   Consultants: OB    Assessment and Plan: * CAP (community acquired pneumonia) On admission, pt with Sudden onset of  shortness of breath associated with chest x-ray finding of bilateral patchy lung infiltrates.  Blood cultures ordered.  Pending COVID-19 testing, respiratory viral panel is negative.  We will start treatment with ceftriaxone and azithromycin. No fever or WBC. Associated with acute hypoxemic respiratory failure, although pulse oximetry is a little bit hard to infer because of patient wearing nasal gel.  Will request respiratory therapy evaluation to consider ear probe.  Uptitrate oxygen to patient comfort. Differential includes fluid overload (patient on 20 mg lasix at home) given marked edema of lower extremity as well as finding of elevated BNP.  I will empirically give 1 dose of Lasix.  Patient troponin elevation is likely due to hypoxemia, however given the overall clinical picture we will pursue an echo as well.  Will also get lower extremity Dopplers to rule out DVT.  10-06-2023 CTPA negative for PE. Viral causes ruled out. Pt has been weaned to RA. Walking in hallways and feeling fine. Will DC to home with 7 day course of duricef and zithromax. Prn albuterol inhaler.  Acute respiratory failure with hypoxia (HCC) 10-06-2023 initial RA sats of 87%. Placed on supplemental O2. Did receive 1 dose of IV lasix. Weaned to RA. CTPA negative for PE.  Resolved.  Postpartum care following vaginal delivery I discussed case with Dr. Wyn Forster - no active OBGYN issues per her . Loko forward to there note. C.w. home rx per them.  History of gestational hypertension PRN labetalol ordered per OBGYN. Discussed with fellow Dr. Leanora Cover. Concideration of pre-ecclempsia per them. They may order magnesium sulfate ppx.  10-06-2023 defer to [facility] for treatment.    DVT prophylaxis: SCDs Start: 10/05/23 1024     Code Status: Full Code Family Communication: no family at bedside. Pt is decisional Disposition Plan: return home Reason for continuing need for hospitalization: stable for DC.  Objective: Vitals:    10/06/23 0900 10/06/23 0921 10/06/23 1000 10/06/23 1223  BP:    132/85  Pulse:    (!) 103  Resp:    19  Temp:    98.2 F (36.8 C)  TempSrc:    Oral  SpO2: 97% 96% 99% 99%  Weight:        Intake/Output Summary (Last 24 hours) at 10/06/2023 1339 Last data filed at 10/06/2023 1200 Gross per 24 hour  Intake 4280.85 ml  Output 9800 ml  Net -5519.15 ml   Filed Weights   10/06/23 0410 10/06/23 0749  Weight: 93.6 kg 93.6 kg    Examination:  Physical Exam Vitals and nursing note reviewed.  Constitutional:      General: She is not in acute distress.    Appearance: She is not toxic-appearing or diaphoretic.  HENT:     Head: Normocephalic and atraumatic.     Nose: Nose normal.  Eyes:     General: No scleral icterus. Cardiovascular:  Rate and Rhythm: Normal rate and regular rhythm.  Pulmonary:     Effort: Pulmonary effort is normal. No respiratory distress.     Breath sounds: Normal breath sounds.     Comments: Scattered basilar crackles Skin:    General: Skin is warm and dry.     Capillary Refill: Capillary refill takes less than 2 seconds.  Neurological:     General: No focal deficit present.     Mental Status: She is alert and oriented to person, place, and time.     Gait: Gait normal.     Data Reviewed: I have personally reviewed following labs and imaging studies  CBC: Recent Labs  Lab 09/30/23 0032 10/02/23 0336 10/05/23 0541 10/06/23 0518  WBC 8.4 17.9* 9.3 8.6  NEUTROABS  --   --  6.8  --   HGB 11.9* 10.3* 11.4* 11.8*  HCT 35.7* 30.9* 34.6* 35.7*  MCV 86.4 88.3 86.9 85.2  PLT 188 174 248 270   Basic Metabolic Panel: Recent Labs  Lab 10/05/23 0541 10/05/23 1828 10/06/23 0518  NA 140  --  137  K 4.0  --  3.6  CL 104  --  103  CO2 26  --  26  GLUCOSE 95  --  107*  BUN 10  --  5*  CREATININE 0.55  --  0.48  CALCIUM 8.6*  --  7.2*  MG  --  4.1* 4.4*  PHOS 5.4*  --  6.1*   GFR: Estimated Creatinine Clearance: 112 mL/min (by C-G formula based  on SCr of 0.48 mg/dL). Liver Function Tests: Recent Labs  Lab 10/05/23 0541 10/06/23 0518  AST 19 15  ALT 26 24  ALKPHOS 98 96  BILITOT 0.3 0.5  PROT 5.5* 5.6*  ALBUMIN 2.4* 2.5*   Coagulation Profile: Recent Labs  Lab 10/06/23 0518  INR 1.1   BNP (last 3 results) Recent Labs    10/05/23 0541 10/06/23 0518  BNP 168.5* 53.9   CBG: Recent Labs  Lab 09/30/23 1351 09/30/23 1807 09/30/23 2226 10/01/23 0519 10/01/23 1004  GLUCAP 110* 96 150* 108* 104*   Sepsis Labs: Recent Labs  Lab 10/06/23 0518  PROCALCITON <0.10    Recent Results (from the past 240 hours)  Respiratory (~20 pathogens) panel by PCR     Status: None   Collection Time: 10/05/23  5:39 AM   Specimen: Nasopharyngeal Swab; Respiratory  Result Value Ref Range Status   Adenovirus NOT DETECTED NOT DETECTED Final   Coronavirus 229E NOT DETECTED NOT DETECTED Final    Comment: (NOTE) The Coronavirus on the Respiratory Panel, DOES NOT test for the novel  Coronavirus (2019 nCoV)    Coronavirus HKU1 NOT DETECTED NOT DETECTED Final   Coronavirus NL63 NOT DETECTED NOT DETECTED Final   Coronavirus OC43 NOT DETECTED NOT DETECTED Final   Metapneumovirus NOT DETECTED NOT DETECTED Final   Rhinovirus / Enterovirus NOT DETECTED NOT DETECTED Final   Influenza A NOT DETECTED NOT DETECTED Final   Influenza B NOT DETECTED NOT DETECTED Final   Parainfluenza Virus 1 NOT DETECTED NOT DETECTED Final   Parainfluenza Virus 2 NOT DETECTED NOT DETECTED Final   Parainfluenza Virus 3 NOT DETECTED NOT DETECTED Final   Parainfluenza Virus 4 NOT DETECTED NOT DETECTED Final   Respiratory Syncytial Virus NOT DETECTED NOT DETECTED Final   Bordetella pertussis NOT DETECTED NOT DETECTED Final   Bordetella Parapertussis NOT DETECTED NOT DETECTED Final   Chlamydophila pneumoniae NOT DETECTED NOT DETECTED Final   Mycoplasma pneumoniae  NOT DETECTED NOT DETECTED Final    Comment: Performed at [facility], [address],  [place], [place] [address]  Culture, blood (Routine X 2) w Reflex to ID Panel     Status: None (Preliminary result)   Collection Time: 10/05/23  9:07 AM   Specimen: BLOOD  Result Value Ref Range Status   Specimen Description BLOOD LEFT ANTECUBITAL  Final   Special Requests   Final    BOTTLES DRAWN AEROBIC AND ANAEROBIC Blood Culture results may not be optimal due to an inadequate volume of blood received in culture bottles   Culture   Final    NO GROWTH < 24 HOURS Performed at [facility], [address], [place], [place] [address]    Report Status PENDING  Incomplete  Culture, blood (Routine X 2) w Reflex to ID Panel     Status: None (Preliminary result)   Collection Time: 10/05/23  9:07 AM   Specimen: BLOOD LEFT HAND  Result Value Ref Range Status   Specimen Description BLOOD LEFT HAND  Final   Special Requests   Final    BOTTLES DRAWN AEROBIC AND ANAEROBIC Blood Culture results may not be optimal due to an inadequate volume of blood received in culture bottles   Culture   Final    NO GROWTH < 24 HOURS Performed at [facility], [address], [place], [place] [address]    Report Status PENDING  Incomplete  SARS Coronavirus 2 by RT PCR (hospital order, performed in [facility] hospital lab) *cepheid single result test* Anterior Nasal Swab     Status: None   Collection Time: 10/05/23  2:08 PM   Specimen: Anterior Nasal Swab  Result Value Ref Range Status   SARS Coronavirus 2 by RT PCR NEGATIVE NEGATIVE Final    Comment: Performed at [facility], [address], [place], [place] [address]     Radiology Studies: CT Angio Chest Pulmonary Embolism (PE) W or WO Contrast Result Date: 10/06/2023 CLINICAL DATA:  Pulmonary embolism (PE) suspected, low to intermediate prob, positive D-dimer Elevated D-dimer. Need to rule out PE. Shortness of breath. EXAM: CT ANGIOGRAPHY CHEST WITH CONTRAST TECHNIQUE: Multidetector CT imaging of the chest was performed using the standard  protocol during bolus administration of intravenous contrast. Multiplanar CT image reconstructions and MIPs were obtained to evaluate the vascular anatomy. RADIATION DOSE REDUCTION: This exam was performed according to the departmental dose-optimization program which includes automated exposure control, adjustment of the mA and/or kV according to patient size and/or use of iterative reconstruction technique. CONTRAST:  75mL OMNIPAQUE IOHEXOL 350 MG/ML SOLN COMPARISON:  CT scan chest from 05/01/2013. FINDINGS: Cardiovascular: Evaluation of pulmonary embolism beyond the main pulmonary arteries is limited due to suboptimal contrast-enhancement. There is no embolism in the main pulmonary arteries. However, consider repeat examination if clinically indicated as lobar and segmental pulmonary arteries are not evaluated on this exam. Normal cardiac size. No pericardial effusion. No aortic aneurysm. Mediastinum/Nodes: Visualized thyroid gland appears grossly unremarkable. No solid / cystic mediastinal masses. The esophagus is nondistended precluding optimal assessment. No axillary, mediastinal or hilar lymphadenopathy by size criteria. Lungs/Pleura: The central tracheo-bronchial tree is patent. There are diffuse solid and ground-glass opacities with associated interlobular septal thickening (known as crazy paving pattern) throughout bilateral lungs with asymmetric moderate involvement of the right upper lobe, mild involvement of the left upper lobe anterior segment and right lower lobe and patchy involvement of remaining lobes. No pneumothorax. There  are bilateral small pleural effusions with associated subsegmental atelectatic changes in the bilateral lower lobes. Findings favor multilobar pneumonia. No suspicious lung nodule. Upper Abdomen: Visualized upper abdominal viscera within normal limits. Musculoskeletal: The visualized soft tissues of the chest wall are grossly unremarkable. No suspicious osseous lesions. Review  of the MIP images confirms the above findings. IMPRESSION: 1. Evaluation for pulmonary embolism is markedly limited due to suboptimal contrast enhancement. There is no embolism in the main pulmonary arteries however, consider repeat examination if clinically indicated as lobar and segmental pulmonary arteries are not evaluated on this exam. 2. Multilobar pneumonia asymmetrically involving the right upper lobe, as discussed above. There are also bilateral small pleural effusions. 3. Multiple other nonacute observations, as described above. Electronically Signed   By: Jules Schick M.D.   On: 10/06/2023 08:56   DG CHEST PORT 1 VIEW Result Date: 10/06/2023 CLINICAL DATA:  33 year old female with history of shortness of breath. EXAM: PORTABLE CHEST 1 VIEW COMPARISON:  Chest x-ray 10/05/2023. FINDINGS: Lung volumes are normal. Diffuse interstitial prominence, widespread peribronchial cuffing, and patchy asymmetrically distributed ill-defined airspace disease scattered throughout the lungs bilaterally, most severe on the right, particularly in the right upper lobe. Overall, aeration appears similar to slightly worsened compared to the prior examination. No definite pleural effusions. No pneumothorax. Pulmonary vasculature does not appear engorged. Heart size appears borderline to mildly enlarged, likely accentuated by portable AP technique. IMPRESSION: 1. Multilobar bilateral bronchopneumonia redemonstrated, likely slightly worsened, particularly in the right upper lobe. Electronically Signed   By: Trudie Reed M.D.   On: 10/06/2023 06:15   VAS [place] LOWER EXTREMITY VENOUS (DVT) Result Date: 10/05/2023  Lower Venous DVT Study Patient Name:  JANAN BOGIE  Date of Exam:   10/05/2023 Medical Rec #: [id number]          Accession #:    [id number] Date of Birth: 11/21/1990          Patient Gender: F Patient Age:   52 years Exam Location:  [facility] Procedure:      VAS [place] LOWER EXTREMITY VENOUS (DVT)  Referring Phys: [facility] GOEL --------------------------------------------------------------------------------  Indications: Swelling, Edema, SOB, and recent postpartum.  Comparison Study: No prior exam. Performing Technologist: Fernande Bras  Examination Guidelines: A complete evaluation includes B-mode imaging, spectral Doppler, color Doppler, and power Doppler as needed of all accessible portions of each vessel. Bilateral testing is considered an integral part of a complete examination. Limited examinations for reoccurring indications may be performed as noted. The reflux portion of the exam is performed with the patient in reverse Trendelenburg.  +---------+---------------+---------+-----------+----------+--------------+ RIGHT    CompressibilityPhasicitySpontaneityPropertiesThrombus Aging +---------+---------------+---------+-----------+----------+--------------+ CFV      Full           Yes      Yes                                 +---------+---------------+---------+-----------+----------+--------------+ SFJ      Full           Yes      Yes                                 +---------+---------------+---------+-----------+----------+--------------+ FV Prox  Full                                                        +---------+---------------+---------+-----------+----------+--------------+  FV Mid   Full                                                        +---------+---------------+---------+-----------+----------+--------------+ FV DistalFull                                                        +---------+---------------+---------+-----------+----------+--------------+ PFV      Full                                                        +---------+---------------+---------+-----------+----------+--------------+ POP      Full           Yes      Yes                                  +---------+---------------+---------+-----------+----------+--------------+ PTV      Full                                                        +---------+---------------+---------+-----------+----------+--------------+ PERO     Full                                                        +---------+---------------+---------+-----------+----------+--------------+ Rouleaux flow noted in bilateral popliteal veins.  +---------+---------------+---------+-----------+----------+--------------+ LEFT     CompressibilityPhasicitySpontaneityPropertiesThrombus Aging +---------+---------------+---------+-----------+----------+--------------+ CFV      Full           Yes      Yes                                 +---------+---------------+---------+-----------+----------+--------------+ SFJ      Full           Yes      Yes                                 +---------+---------------+---------+-----------+----------+--------------+ FV Prox  Full                                                        +---------+---------------+---------+-----------+----------+--------------+ FV Mid   Full                                                        +---------+---------------+---------+-----------+----------+--------------+  FV DistalFull                                                        +---------+---------------+---------+-----------+----------+--------------+ PFV      Full                                                        +---------+---------------+---------+-----------+----------+--------------+ POP      Full           Yes      Yes                                 +---------+---------------+---------+-----------+----------+--------------+ PTV      Full                                                        +---------+---------------+---------+-----------+----------+--------------+ PERO     Full                                                         +---------+---------------+---------+-----------+----------+--------------+ Rouleaux flow noted in bilateral popliteal veins.    Summary: BILATERAL: - No evidence of deep vein thrombosis seen in the lower extremities, bilaterally. -No evidence of popliteal cyst, bilaterally.   *See table(s) above for measurements and observations. Electronically signed by Heath Lark on 10/05/2023 at 4:15:32 PM.    Final    ECHOCARDIOGRAM COMPLETE Result Date: 10/05/2023    ECHOCARDIOGRAM REPORT   Patient Name:   DAROTHY COURTRIGHT Date of Exam: 10/05/2023 Medical Rec #:  [id number]         Height:       64.0 in Accession #:    [id number]        Weight:       221.6 lb Date of Birth:  1990-09-21         BSA:          2.044 m Patient Age:    32 years          BP:           150/84 mmHg Patient Gender: F                 HR:           80 bpm. Exam Location:  Inpatient Procedure: 2D Echo, Cardiac Doppler and Color Doppler Indications:    CHF-Acute Systolic I50.21  History:        Patient has no prior history of Echocardiogram examinations.  Sonographer:    Darlys Gales Referring Phys: [id number] [facility] GOEL IMPRESSIONS  1. Left ventricular ejection fraction, by estimation, is 55 to 60%. The left ventricle has normal function. Left ventricular endocardial border not optimally defined to evaluate regional wall motion. Left ventricular diastolic parameters are indeterminate.  2.  Right ventricular systolic function is normal. The right ventricular size is normal. Tricuspid regurgitation signal is inadequate for assessing PA pressure.  3. The mitral valve is normal in structure. No evidence of mitral valve regurgitation. No evidence of mitral stenosis.  4. The aortic valve is tricuspid. Aortic valve regurgitation is not visualized. No aortic stenosis is present.  5. IVC not visualized.  6. Technically difficult study with poor acoustic windows. FINDINGS  Left Ventricle: Left ventricular ejection fraction, by estimation, is 55 to 60%. The left  ventricle has normal function. Left ventricular endocardial border not optimally defined to evaluate regional wall motion. The left ventricular internal cavity size was normal in size. There is no left ventricular hypertrophy. Left ventricular diastolic parameters are indeterminate. Right Ventricle: The right ventricular size is normal. No increase in right ventricular wall thickness. Right ventricular systolic function is normal. Tricuspid regurgitation signal is inadequate for assessing PA pressure. Left Atrium: Left atrial size was normal in size. Right Atrium: Right atrial size was normal in size. Pericardium: There is no evidence of pericardial effusion. Mitral Valve: The mitral valve is normal in structure. No evidence of mitral valve regurgitation. No evidence of mitral valve stenosis. Tricuspid Valve: The tricuspid valve is normal in structure. Tricuspid valve regurgitation is trivial. Aortic Valve: The aortic valve is tricuspid. Aortic valve regurgitation is not visualized. No aortic stenosis is present. Aortic valve peak gradient measures 3.2 mmHg. Pulmonic Valve: The pulmonic valve was normal in structure. Pulmonic valve regurgitation is not visualized. Aorta: The aortic root is normal in size and structure. Venous: The inferior vena cava was not well visualized. IAS/Shunts: No atrial level shunt detected by color flow Doppler.  LEFT VENTRICLE PLAX 2D LVIDd:         4.30 cm LVIDs:         2.50 cm LV PW:         1.00 cm LV IVS:        1.00 cm LVOT diam:     1.80 cm LV SV:         40 LV SV Index:   20 LVOT Area:     2.54 cm  AORTIC VALVE AV Area (Vmax): 2.35 cm AV Vmax:        89.80 cm/s AV Peak Grad:   3.2 mmHg LVOT Vmax:      83.00 cm/s LVOT Vmean:     56.600 cm/s LVOT VTI:       0.159 m MITRAL VALVE MV Area (PHT): 4.49 cm    SHUNTS MV Decel Time: 169 msec    Systemic VTI:  0.16 m MV E velocity: 95.90 cm/s  Systemic Diam: 1.80 cm MV A velocity: 60.30 cm/s MV E/A ratio:  1.59 Dalton McleanMD  Electronically signed by Wilfred Lacy Signature Date/Time: 10/05/2023/12:42:26 PM    Final    DG CHEST PORT 1 VIEW Result Date: 10/05/2023 CLINICAL DATA:  33 year old female with history of chest pain. EXAM: PORTABLE CHEST 1 VIEW COMPARISON:  Chest x-ray 11/15/2015. FINDINGS: Widespread areas of interstitial prominence, diffuse peribronchial cuffing and patchy multifocal airspace disease scattered throughout the lungs bilaterally. No pleural effusions. No pneumothorax. Pulmonary vasculature does not appear engorged. Heart size is upper limits of normal. Upper mediastinal contours are within normal limits. IMPRESSION: 1. The appearance of the chest is concerning for multilobar bilateral bronchopneumonia. Electronically Signed   By: Trudie Reed M.D.   On: 10/05/2023 05:56    Scheduled Meds:  [START ON 10/07/2023] azithromycin  500 mg Oral  Daily   enoxaparin (LOVENOX) injection  50 mg Subcutaneous Q24H   gabapentin  300 mg Oral BID   NIFEdipine  30 mg Oral Daily   prenatal multivitamin  1 tablet Oral Q1200   senna-docusate  2 tablet Oral BID   sodium chloride flush  3 mL Intravenous Q12H   Continuous Infusions:  cefTRIAXone (ROCEPHIN)  IV Stopped (10/06/23 2952)     LOS: 1 day   Time spent: 40 minutes  Carollee Herter, DO  [facility]  10/06/2023, 1:39 PM

## 2023-10-06 NOTE — Progress Notes (Signed)
Gynecology Progress Note  Admission Date: 10/05/2023 Current Date: 10/06/2023 12:32 PM  Pamela Klein is a 33 y.o. [id number] HD#2 admitted for CAP and superimposed postpartum preeclampsia   History complicated by: Patient Active Problem List   Diagnosis Date Noted   CAP (community acquired pneumonia) 10/05/2023   Community acquired pneumonia 10/05/2023   Postpartum care following vaginal delivery 10/02/2023   Gestational diabetes mellitus 09/30/2023   Marginal insertion of umbilical cord affecting management of mother 06/30/2023   Prediabetes 04/19/2023   History of gestational hypertension 04/07/2023   Supervision of high-risk pregnancy, third trimester 04/07/2023   Obesity in pregnancy, antepartum 04/07/2023    ROS and patient/family/surgical history, located on admission H&P note dated 10/05/2023, have been reviewed, and there are no changes except as noted below Yesterday/Overnight Events:  Pt completed 24 hour magnesium sulfate recovery and got CT scan to rule out PE  Subjective:  Pt seen this AM and looks much improved from previous.  Magnesium sulfate is off and pt is breathing well on room air.  She denies any headache, visual changes or RUQ pain.  Pt noted good diuresis from her lasix.  Objective:   Vitals:   10/06/23 0900 10/06/23 0921 10/06/23 1000 10/06/23 1223  BP:    132/85  Pulse:    (!) 103  Resp:    19  Temp:    98.2 F (36.8 C)  TempSrc:    Oral  SpO2: 97% 96% 99% 99%  Weight:        Temp:  [97.9 F (36.6 C)-98.2 F (36.8 C)] 98.2 F (36.8 C) (01/30 1223) Pulse Rate:  [86-105] 103 (01/30 1223) Resp:  [16-22] 19 (01/30 1223) BP: (113-149)/(66-98) 132/85 (01/30 1223) SpO2:  [82 %-100 %] 99 % (01/30 1223) FiO2 (%):  [36 %] 36 % (01/30 0540) Weight:  [93.6 kg] 93.6 kg (01/30 0749) I/O last 3 completed shifts: In: 2792.2 [P.O.:1620; I.V.:933.2; IV Piggyback:239] Out: 7050 [Urine:7050] Total I/O In: 1730 [P.O.:1380; IV Piggyback:350] Out: 3300  [Urine:3300]  Intake/Output Summary (Last 24 hours) at 10/06/2023 1232 Last data filed at 10/06/2023 1119 Gross per 24 hour  Intake 4163.24 ml  Output 9800 ml  Net -5636.76 ml     Current Vital Signs 24h Vital Sign Ranges  T 98.2 F (36.8 C) Temp  Avg: 98.1 F (36.7 C)  Min: 97.9 F (36.6 C)  Max: 98.2 F (36.8 C)  BP 132/85 BP  Min: 113/69  Max: 149/88  HR (!) 103 Pulse  Avg: 94.5  Min: 86  Max: 105  RR 19 Resp  Avg: 18.2  Min: 16  Max: 22  SaO2 99 % Room Air SpO2  Avg: 96.4 %  Min: 82 %  Max: 100 %       24 Hour I/O Current Shift I/O  Time Ins Outs 01/29 0701 - 01/30 0700 In: 2792.2 [P.O.:1620; I.V.:933.2] Out: 7050 [Urine:7050] 01/30 0701 - 01/30 1900 In: 1730 [P.O.:1380] Out: 3300 [Urine:3300]   Patient Vitals for the past 12 hrs:  BP Temp Temp src Pulse Resp SpO2 Weight  10/06/23 1223 132/85 98.2 F (36.8 C) Oral (!) 103 19 99 % --  10/06/23 1000 -- -- -- -- -- 99 % --  10/06/23 0921 -- -- -- -- -- 96 % --  10/06/23 0900 -- -- -- -- -- 97 % --  10/06/23 0803 -- -- -- -- -- 98 % --  10/06/23 0749 -- -- -- -- -- -- 93.6 kg  10/06/23 0741 (!) 144/82 98.1  F (36.7 C) Oral 88 20 97 % --  10/06/23 0635 (!) 149/88 98 F (36.7 C) Oral 91 18 97 % --  10/06/23 0540 -- -- -- -- -- 98 % --  10/06/23 0420 -- -- -- -- 20 96 % --  10/06/23 0410 139/78 -- -- 92 16 91 % 93.6 kg  10/06/23 0310 -- -- -- -- 18 -- --  10/06/23 0245 -- -- -- -- 18 94 % --  10/06/23 0131 -- -- -- -- 20 97 % --  10/06/23 0130 -- -- -- -- -- (!) 82 % --  10/06/23 0050 -- -- -- -- 16 97 % --     Patient Vitals for the past 24 hrs:  BP Temp Temp src Pulse Resp SpO2 Weight  10/06/23 1223 132/85 98.2 F (36.8 C) Oral (!) 103 19 99 % --  10/06/23 1000 -- -- -- -- -- 99 % --  10/06/23 0921 -- -- -- -- -- 96 % --  10/06/23 0900 -- -- -- -- -- 97 % --  10/06/23 0803 -- -- -- -- -- 98 % --  10/06/23 0749 -- -- -- -- -- -- 93.6 kg  10/06/23 0741 (!) 144/82 98.1 F (36.7 C) Oral 88 20 97 % --   10/06/23 0635 (!) 149/88 98 F (36.7 C) Oral 91 18 97 % --  10/06/23 0540 -- -- -- -- -- 98 % --  10/06/23 0420 -- -- -- -- 20 96 % --  10/06/23 0410 139/78 -- -- 92 16 91 % 93.6 kg  10/06/23 0310 -- -- -- -- 18 -- --  10/06/23 0245 -- -- -- -- 18 94 % --  10/06/23 0131 -- -- -- -- 20 97 % --  10/06/23 0130 -- -- -- -- -- (!) 82 % --  10/06/23 0050 -- -- -- -- 16 97 % --  10/05/23 2345 -- -- -- -- 18 98 % --  10/05/23 2340 -- -- -- -- -- 90 % --  10/05/23 2330 -- -- -- -- -- 94 % --  10/05/23 2329 -- -- -- -- -- 96 % --  10/05/23 2312 117/66 98.2 F (36.8 C) Oral 86 16 -- --  10/05/23 2240 -- -- -- -- 16 93 % --  10/05/23 2146 -- -- -- -- 16 97 % --  10/05/23 2046 -- -- -- -- 18 98 % --  10/05/23 1928 136/84 98.2 F (36.8 C) Oral 95 18 99 % --  10/05/23 1915 -- -- -- -- -- 97 % --  10/05/23 1811 (!) 140/86 -- -- (!) 103 18 100 % --  10/05/23 1706 139/80 -- -- (!) 105 18 100 % --  10/05/23 1604 (!) 147/84 98.1 F (36.7 C) Oral 88 20 100 % --  10/05/23 1501 113/69 -- -- 93 20 100 % --  10/05/23 1407 (!) 148/98 -- -- 92 (!) 22 98 % --  10/05/23 1315 (!) 143/77 -- -- 97 18 95 % --  10/05/23 1303 (!) 141/80 -- -- 96 18 96 % --  10/05/23 1247 (!) 145/83 97.9 F (36.6 C) Oral 94 18 98 % --    Physical exam: General appearance: alert, cooperative, appears stated age, and no distress Abdomen: soft, non-tender; bowel sounds normal; no masses,  no organomegaly GU: No gross VB Lungs:  CTA bilaterally, no obvious crackles or rhonchi Heart: regular rate and rhythm Extremities: no lower extremity edema Skin: WNL Psych: appropriate Neurologic: Grossly normal  Medications Current  Facility-Administered Medications  Medication Dose Route Frequency Provider Last Rate Last Admin   acetaminophen (TYLENOL) tablet 650 mg  650 mg Oral Q6H PRN Nolberto Hanlon, MD   650 mg at 10/06/23 1110   Or   acetaminophen (TYLENOL) suppository 650 mg  650 mg Rectal Q6H PRN Nolberto Hanlon, MD       Melene Muller ON  10/07/2023] azithromycin (ZITHROMAX) tablet 500 mg  500 mg Oral Daily Carollee Herter, DO       benzocaine-Menthol (DERMOPLAST) 20-0.5 % topical spray 1 Application  1 Application Topical QID PRN Warden Fillers, MD   1 Application at 10/05/23 1209   cefTRIAXone (ROCEPHIN) 2 g in sodium chloride 0.9 % 100 mL IVPB  2 g Intravenous Q24H Janalyn Shy, Subrina, MD   Stopped at 10/06/23 1610   coconut oil  1 Application Topical PRN Warden Fillers, MD   1 Application at 10/05/23 1209   enoxaparin (LOVENOX) injection 50 mg  50 mg Subcutaneous Q24H Nolberto Hanlon, MD   50 mg at 10/06/23 1112   gabapentin (NEURONTIN) capsule 300 mg  300 mg Oral BID Nolberto Hanlon, MD   300 mg at 10/06/23 9604   labetalol (NORMODYNE) injection 20 mg  20 mg Intravenous PRN Wyn Forster, MD       And   labetalol (NORMODYNE) injection 40 mg  40 mg Intravenous PRN Wyn Forster, MD       And   labetalol (NORMODYNE) injection 80 mg  80 mg Intravenous PRN Wyn Forster, MD       And   hydrALAZINE (APRESOLINE) injection 10 mg  10 mg Intravenous PRN Wyn Forster, MD       hydrOXYzine (ATARAX) tablet 25 mg  25 mg Oral TID PRN Nolberto Hanlon, MD       levalbuterol Pauline Aus) nebulizer solution 0.63 mg  0.63 mg Nebulization Q6H PRN Janalyn Shy, Subrina, MD   0.63 mg at 10/06/23 0538   NIFEdipine (PROCARDIA-XL/NIFEDICAL-XL) 24 hr tablet 30 mg  30 mg Oral Daily Wyn Forster, MD   30 mg at 10/06/23 5409   oxyCODONE (Oxy IR/ROXICODONE) immediate release tablet 5 mg  5 mg Oral Q4H PRN Nolberto Hanlon, MD   5 mg at 10/05/23 1953   polyethylene glycol (MIRALAX / GLYCOLAX) packet 17 g  17 g Oral Daily PRN Nolberto Hanlon, MD       prenatal multivitamin tablet 1 tablet  1 tablet Oral Q1200 [place]af Bing, MD   1 tablet at 10/06/23 1110   senna-docusate (Senokot-S) tablet 2 tablet  2 tablet Oral BID Nolberto Hanlon, MD   2 tablet at 10/06/23 0956   sodium chloride flush (NS) 0.9 % injection 3 mL  3 mL Intravenous Conchita Paris, MD   3 mL at 10/06/23 0945       Labs  Recent Labs  Lab 10/02/23 0336 10/05/23 0541 10/06/23 0518  WBC 17.9* 9.3 8.6  HGB 10.3* 11.4* 11.8*  HCT 30.9* 34.6* 35.7*  PLT 174 248 270    Recent Labs  Lab 10/05/23 0541 10/06/23 0518  NA 140 137  K 4.0 3.6  CL 104 103  CO2 26 26  BUN 10 5*  CREATININE 0.55 0.48  CALCIUM 8.6* 7.2*  PROT 5.5* 5.6*  BILITOT 0.3 0.5  ALKPHOS 98 96  ALT 26 24  AST 19 15  GLUCOSE 95 107*    Radiology CLINICAL DATA:  Pulmonary embolism (PE) suspected, low to intermediate prob, positive D-dimer Elevated D-dimer. Need to rule out PE. Shortness of breath.  EXAM: CT ANGIOGRAPHY CHEST WITH CONTRAST   TECHNIQUE: Multidetector CT imaging of the chest was performed using the standard protocol during bolus administration of intravenous contrast. Multiplanar CT image reconstructions and MIPs were obtained to evaluate the vascular anatomy.   RADIATION DOSE REDUCTION: This exam was performed according to the departmental dose-optimization program which includes automated exposure control, adjustment of the mA and/or kV according to patient size and/or use of iterative reconstruction technique.   CONTRAST:  75mL OMNIPAQUE IOHEXOL 350 MG/ML SOLN   COMPARISON:  CT scan chest from 05/01/2013.   FINDINGS: Cardiovascular: Evaluation of pulmonary embolism beyond the main pulmonary arteries is limited due to suboptimal contrast-enhancement. There is no embolism in the main pulmonary arteries. However, consider repeat examination if clinically indicated as lobar and segmental pulmonary arteries are not evaluated on this exam.   Normal cardiac size. No pericardial effusion. No aortic aneurysm.   Mediastinum/Nodes: Visualized thyroid gland appears grossly unremarkable. No solid / cystic mediastinal masses. The esophagus is nondistended precluding optimal assessment. No axillary, mediastinal or hilar lymphadenopathy by size criteria.   Lungs/Pleura: The central  tracheo-bronchial tree is patent. There are diffuse solid and ground-glass opacities with associated interlobular septal thickening (known as crazy paving pattern) throughout bilateral lungs with asymmetric moderate involvement of the right upper lobe, mild involvement of the left upper lobe anterior segment and right lower lobe and patchy involvement of remaining lobes. No pneumothorax. There are bilateral small pleural effusions with associated subsegmental atelectatic changes in the bilateral lower lobes. Findings favor multilobar pneumonia. No suspicious lung nodule.   Upper Abdomen: Visualized upper abdominal viscera within normal limits.   Musculoskeletal: The visualized soft tissues of the chest wall are grossly unremarkable. No suspicious osseous lesions.   Review of the MIP images confirms the above findings.   IMPRESSION: 1. Evaluation for pulmonary embolism is markedly limited due to suboptimal contrast enhancement. There is no embolism in the main pulmonary arteries however, consider repeat examination if clinically indicated as lobar and segmental pulmonary arteries are not evaluated on this exam. 2. Multilobar pneumonia asymmetrically involving the right upper lobe, as discussed above. There are also bilateral small pleural effusions. 3. Multiple other nonacute observations, as described above.    Assessment & Plan:  Community acquired pneumonia and superimposed postpartum preeclampsia  Pt is much improved from previous.  She is off magnesium sulfate and is currently on room air. BP mildly elevated but improved from previous.   Pt can be discharged from [facility] standpoint, would need to continue po procardia with another 3-4 days of po lasix.   Advise BP check with OB office in 1 week  Code Status: Full Code  Total time taking care of the patient was 10 minutes, with greater than 50% of the time spent in face to face interaction with the patient.  Mariel Aloe,  MD Attending Center for [facility])

## 2023-10-09 LAB — LEGIONELLA PNEUMOPHILA SEROGP 1 UR AG: L. pneumophila Serogp 1 Ur Ag: NEGATIVE

## 2023-10-10 LAB — CULTURE, BLOOD (ROUTINE X 2)
Culture: NO GROWTH
Culture: NO GROWTH

## 2023-10-13 ENCOUNTER — Telehealth: Payer: Self-pay | Admitting: Clinical

## 2023-10-13 ENCOUNTER — Ambulatory Visit: Payer: Managed Care, Other (non HMO)

## 2023-10-13 DIAGNOSIS — Z0130 Encounter for examination of blood pressure without abnormal findings: Secondary | ICD-10-CM

## 2023-10-13 NOTE — Progress Notes (Signed)
Patient here for BP check.  Within normal limits.  Patient continues to monitor at home.  Denies any headaches, blurred vision or dizziness. Danna Hefty

## 2023-10-13 NOTE — Telephone Encounter (Signed)
Attempt call regarding referral; Left HIPPA-compliant message to call back Mikle Sternberg from [facility] at  [phone] (Ashelyn Mccravy's office).    

## 2023-10-31 ENCOUNTER — Telehealth: Payer: Self-pay | Admitting: Clinical

## 2023-10-31 NOTE — Telephone Encounter (Signed)
 Attempt call regarding referral; Pt declines services at this time; will call Asher Muir at [phone] as needed in the future.

## 2023-11-07 ENCOUNTER — Ambulatory Visit: Payer: Managed Care, Other (non HMO) | Admitting: Family

## 2023-11-07 DIAGNOSIS — Z8759 Personal history of other complications of pregnancy, childbirth and the puerperium: Secondary | ICD-10-CM

## 2023-11-07 DIAGNOSIS — J189 Pneumonia, unspecified organism: Secondary | ICD-10-CM

## 2023-11-10 ENCOUNTER — Telehealth: Payer: 59 | Admitting: Family Medicine

## 2023-11-16 ENCOUNTER — Telehealth (INDEPENDENT_AMBULATORY_CARE_PROVIDER_SITE_OTHER): Admitting: Family Medicine

## 2023-11-16 DIAGNOSIS — O165 Unspecified maternal hypertension, complicating the puerperium: Secondary | ICD-10-CM | POA: Diagnosis not present

## 2023-11-16 DIAGNOSIS — O24430 Gestational diabetes mellitus in the puerperium, diet controlled: Secondary | ICD-10-CM

## 2023-11-16 DIAGNOSIS — O24410 Gestational diabetes mellitus in pregnancy, diet controlled: Secondary | ICD-10-CM

## 2023-11-16 MED ORDER — IBUPROFEN 800 MG PO TABS: 800.0000 mg | ORAL_TABLET | Freq: Three times a day (TID) | ORAL | 3 refills | Status: DC | PRN

## 2023-11-16 MED ORDER — NORETHINDRONE 0.35 MG PO TABS: 1.0000 | ORAL_TABLET | Freq: Every day | ORAL | 3 refills | Status: DC

## 2023-11-16 NOTE — Progress Notes (Signed)
 Provider location: Center for [facility] at [facility]   Patient location: Home  I connected withNAME@ on 11/16/23 at 10:15 AM EDT by Mychart Video Encounter and verified that I am speaking with the correct person using two identifiers.       I discussed the limitations, risks, security and privacy concerns of performing an evaluation and management service virtually and the availability of in person appointments. I also discussed with the patient that there may be a patient responsible charge related to this service. The patient expressed understanding and agreed to proceed.  Post Partum Visit Note Subjective:   Pamela Klein is a 33 y.o. G50P2002 female who presents for a postpartum visit. She is 6 weeks postpartum following a normal spontaneous vaginal delivery.  I have fully reviewed the prenatal and intrapartum course. The delivery was at 39 gestational weeks.  Anesthesia: epidural. Postpartum course has been complicated by pneumonia. Baby is doing well. Baby is feeding by breast. Bleeding no bleeding. Bowel function is normal. Bladder function is normal. Patient is not sexually active. Contraception method is oral progesterone-only contraceptive. Postpartum depression screening: negative.   The pregnancy intention screening data noted above was reviewed. Potential methods of contraception were discussed. The patient elected to proceed with No data recorded.    The following portions of the patient's history were reviewed and updated as appropriate: allergies, current medications, past family history, past medical history, past social history, past surgical history, and problem list.  Review of Systems Pertinent items are noted in HPI.  Objective:  LMP 11/08/2023 (Exact Date)   Breastfeeding Yes     General:  Alert, oriented and cooperative. Patient is in no acute distress.  Respiratory: Normal respiratory effort, no problems with respiration noted  Mental  Status: Normal mood and affect. Normal behavior. Normal judgment and thought content.  Rest of physical exam deferred due to type of encounter   Assessment:   1. Postpartum exam (Primary)  2. Hypertension, postpartum condition or complication BP at home normal  3. Diet controlled gestational diabetes mellitus (GDM) in third trimester Check A1c in 6 weeks   Plan:  Essential components of care per ACOG recommendations:  1.  Mood and well being: Patient with negative depression screening today. Reviewed local resources for support.  - Patient does not use tobacco.  - hx of drug use? No    2. Infant care and feeding:  -Patient currently breastmilk feeding? Yes If breastmilk feeding discussed return to work and pumping. If needed, patient was provided letter for work to allow for every 2-3 hr pumping breaks, and to be granted a private location to express breastmilk and refrigerated area to store breastmilk. Reviewed importance of draining breast regularly to support lactation. -Social determinants of health (SDOH) reviewed in EPIC. No concerns  3. Sexuality, contraception and birth spacing - Patient does not want a pregnancy in the next year.  - Reviewed forms of contraception in tiered fashion. Patient desired no method today.   - Discussed birth spacing of 18 months  4. Sleep and fatigue -Encouraged family/partner/community support of 4 hrs of uninterrupted sleep to help with mood and fatigue  5. Physical Recovery  - Discussed patients delivery - Patient had a 1st degree laceration, perineal healing reviewed. Patient expressed understanding - Patient has urinary incontinence? No  - Patient is safe to resume physical and sexual activity  6.  Health Maintenance - Last pap smear done 04/2023 and was normal with negative HPV.   7. Chronic  Disease - PCP follow up  I provided 15 minutes of face-to-face time during this encounter.    No follow-ups on file.  No future  appointments.  Levie Heritage, DO Center for [employer], [facility]

## 2023-11-29 ENCOUNTER — Telehealth: Payer: Self-pay | Admitting: Family Medicine

## 2023-11-29 NOTE — Telephone Encounter (Signed)
 Patient called wanting to know if she can get a letter stating that she is released to go back to work with no restrictions

## 2023-12-28 ENCOUNTER — Other Ambulatory Visit (HOSPITAL_COMMUNITY)
Admission: RE | Admit: 2023-12-28 | Discharge: 2023-12-28 | Disposition: A | Discharge status: Home / Self Care | Source: Ambulatory Visit | Attending: Family Medicine | Admitting: Family Medicine

## 2023-12-28 ENCOUNTER — Other Ambulatory Visit

## 2023-12-28 VITALS — BP 115/64 | HR 73 | Temp 97.6°F | Wt 203.0 lb

## 2023-12-28 DIAGNOSIS — N898 Other specified noninflammatory disorders of vagina: Secondary | ICD-10-CM | POA: Diagnosis present

## 2023-12-28 DIAGNOSIS — O24410 Gestational diabetes mellitus in pregnancy, diet controlled: Secondary | ICD-10-CM

## 2023-12-28 NOTE — Progress Notes (Unsigned)
 Patient presents for Hemoglobin A1C. Patient was sent to the lab to have lab drawn.    SUBJECTIVE:  33 y.o. female complains of white vaginal discharge for 2 week(s). Denies abnormal vaginal bleeding or significant pelvic pain or fever. No UTI symptoms. Denies history of known exposure to STD.  No LMP recorded.  OBJECTIVE:  She appears well, afebrile. Urine dipstick: not do.  ASSESSMENT:  Vaginal Discharge     PLAN:  GC, chlamydia, trichomonas, BVAG, CVAG probe sent to lab. Treatment: To be determined once lab results are received ROV prn if symptoms persist or worsen.   Jamilyn Pigeon l Hajra Port, CMA

## 2023-12-29 LAB — CERVICOVAGINAL ANCILLARY ONLY
Bacterial Vaginitis (gardnerella): POSITIVE — AB
Candida Glabrata: NEGATIVE
Candida Vaginitis: NEGATIVE
Chlamydia: NEGATIVE
Comment: NEGATIVE
Comment: NEGATIVE
Comment: NEGATIVE
Comment: NEGATIVE
Comment: NEGATIVE
Comment: NORMAL
Neisseria Gonorrhea: NEGATIVE
Trichomonas: NEGATIVE

## 2023-12-29 LAB — HEMOGLOBIN A1C
Est. average glucose Bld gHb Est-mCnc: 114 mg/dL
Hgb A1c MFr Bld: 5.6 % (ref 4.8–5.6)

## 2023-12-30 ENCOUNTER — Telehealth: Payer: Self-pay

## 2023-12-30 ENCOUNTER — Encounter: Payer: Self-pay | Admitting: Family Medicine

## 2023-12-30 DIAGNOSIS — F53 Postpartum depression: Secondary | ICD-10-CM

## 2023-12-30 MED ORDER — FLUCONAZOLE 150 MG PO TABS: 150.0000 mg | ORAL_TABLET | Freq: Once | ORAL | 0 refills | Status: AC

## 2023-12-30 MED ORDER — METRONIDAZOLE 0.75 % VA GEL: 1.0000 | Freq: Every day | VAGINAL | 1 refills | Status: DC

## 2023-12-30 NOTE — Telephone Encounter (Signed)
 Patient called in stating that she typically gets a yeast infection when she is treated for BV. She would like to have an rx sent in for yeast since she is being treated for BV.   Informed patient that I would route this request to Dr. Cathyann Cobia for his review.

## 2023-12-30 NOTE — Addendum Note (Signed)
 Addended by: Malka Sea on: 12/30/2023 01:20 PM   Modules accepted: Orders

## 2023-12-30 NOTE — Telephone Encounter (Signed)
 Diflucan sent to pharmacy.

## 2024-02-07 NOTE — BH Specialist Note (Unsigned)
 Integrated Behavioral Health via Telemedicine Visit  02/09/2024 Pamela Klein [id number]  Number of Integrated Behavioral Health Clinician visits: 1- Initial Visit  Session Start time: 1017   Session End time: 1052  Total time in minutes: 35    Referring Provider: Werner Hamlet, DO Patient/Family location: Home [facility] Provider location: [facility]  All persons participating in visit: [name] and [facility]   Types of Service: Individual psychotherapy and Video visit  I connected with Pamela Klein and/or Pamela Klein's n/a via  Telephone or Engineer, civil (consulting)  (Video is Caregility application) and verified that I am speaking with the correct person using two identifiers. Discussed confidentiality: Yes   I discussed the limitations of telemedicine and the availability of in person appointments.  Discussed there is a possibility of technology failure and discussed alternative modes of communication if that failure occurs.  I discussed that engaging in this telemedicine visit, they consent to the provision of behavioral healthcare and the services will be billed under their insurance.  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 Pt wants to start back again writing in her journal and exercising for  self-care; open to implementing additional self-coping strategy today.  Duration of problem: Postpartum; Severity of problem: moderately severe  Patient and/or Family's Strengths/Protective Factors: Social connections, Concrete supports in place (healthy food, safe environments, etc.), and Sense of purpose  Goals Addressed: Patient will:  Reduce symptoms of: anxiety, depression, and stress   Increase knowledge and/or ability of: healthy habits and self-management skills   Demonstrate ability to: Increase healthy adjustment to current life circumstances and Increase adequate support systems for patient/family  Progress towards Goals: Ongoing    Interventions: Interventions utilized:  Mining engineer, Psychoeducation and/or Health Education, Link to [employer], and Supportive Reflection Standardized Assessments completed: Not Needed    Patient and/or Family Response: Patient agrees with treatment plan.   Clinical Assessment/Diagnosis  No diagnosis found.    Assessment: Patient currently experiencing Adjustment disorder with mixed anxious and depressed mood.   Patient may benefit from psychoeducation and brief therapeutic interventions regarding coping with symptoms of depression and anxiety  Plan: Follow up with behavioral health clinician on : Three weeks Behavioral recommendations:  -Begin  prioritizing healthy self-care (regular meals, adequate rest; allowing practical help from supportive friends and family as needed) -Consider new mom support group as needed at either [email or web address].net or [email or web address]  -Begin Worry Time strategy, as discussed. Start by setting up start and end time reminders on phone today; continue daily for three weeks (consider adding exercise plans to "worry time" list in journal) Referral(s): Integrated Art gallery manager (In Clinic) and [employer]:  new parent support  I discussed the assessment and  treatment plan with the patient and/or parent/guardian. They were provided an opportunity to ask questions and all were answered. They agreed with the plan and demonstrated an understanding of the instructions.   They were advised to call back or seek an in-person  evaluation if the symptoms worsen or if the condition fails to improve as anticipated.  Pamela Rayborn C Artin Mceuen, LCSW

## 2024-02-08 ENCOUNTER — Ambulatory Visit (INDEPENDENT_AMBULATORY_CARE_PROVIDER_SITE_OTHER): Admitting: Clinical

## 2024-02-08 DIAGNOSIS — F4323 Adjustment disorder with mixed anxiety and depressed mood: Secondary | ICD-10-CM | POA: Diagnosis not present

## 2024-02-09 NOTE — Patient Instructions (Signed)
 Center for [facility] at [facility] [address] [place], [place] [address] [phone] (main office) [phone] [facility] office)  New Parent Support Groups [email or web address] [email or web address]

## 2024-02-11 ENCOUNTER — Other Ambulatory Visit: Payer: Self-pay | Admitting: Medical Genetics

## 2024-02-17 ENCOUNTER — Other Ambulatory Visit (HOSPITAL_COMMUNITY)
Admission: RE | Admit: 2024-02-17 | Discharge: 2024-02-17 | Disposition: A | Discharge status: Home / Self Care | Source: Ambulatory Visit | Attending: Family | Admitting: Family

## 2024-02-17 ENCOUNTER — Ambulatory Visit (INDEPENDENT_AMBULATORY_CARE_PROVIDER_SITE_OTHER): Admitting: Family

## 2024-02-17 ENCOUNTER — Encounter: Payer: Self-pay | Admitting: Family

## 2024-02-17 VITALS — BP 131/74 | HR 82 | Temp 98.6°F | Resp 16 | Ht 65.0 in | Wt 210.0 lb

## 2024-02-17 DIAGNOSIS — F419 Anxiety disorder, unspecified: Secondary | ICD-10-CM | POA: Diagnosis not present

## 2024-02-17 DIAGNOSIS — N946 Dysmenorrhea, unspecified: Secondary | ICD-10-CM | POA: Insufficient documentation

## 2024-02-17 DIAGNOSIS — N760 Acute vaginitis: Secondary | ICD-10-CM

## 2024-02-17 DIAGNOSIS — R059 Cough, unspecified: Secondary | ICD-10-CM | POA: Diagnosis not present

## 2024-02-17 DIAGNOSIS — Z0000 Encounter for general adult medical examination without abnormal findings: Secondary | ICD-10-CM | POA: Diagnosis not present

## 2024-02-17 DIAGNOSIS — R7303 Prediabetes: Secondary | ICD-10-CM

## 2024-02-17 DIAGNOSIS — D649 Anemia, unspecified: Secondary | ICD-10-CM | POA: Diagnosis not present

## 2024-02-17 MED ORDER — ALBUTEROL SULFATE HFA 108 (90 BASE) MCG/ACT IN AERS: 2.0000 | INHALATION_SPRAY | Freq: Four times a day (QID) | RESPIRATORY_TRACT | Status: AC | PRN

## 2024-02-17 NOTE — Assessment & Plan Note (Signed)
  Intermittent cough and wheezing during exercise. Inhaler available but unused. Pre-exercise inhaler use recommended. - Use inhaler as needed, especially before exercise. - Consider maintenance medication if inhaler use becomes frequent.

## 2024-02-17 NOTE — Assessment & Plan Note (Signed)
  General Health Maintenance Up to date on Pap smear. Dental and vision care updates in progress. Regular exercise and healthy lifestyle encouraged. - Schedule dental appointment. - Attend upcoming eye exam. - Encourage regular exercise and healthy lifestyle.

## 2024-02-17 NOTE — Assessment & Plan Note (Signed)
  Postpartum anxiety with presented initially with difficulty concentrating and feeling overwhelmed, related to separation from children and transitions. Therapy and support system in place. Symptoms improving now that she has the summer off. - Continue therapy sessions every other week. - Engage in regular exercise.

## 2024-02-17 NOTE — Assessment & Plan Note (Signed)
  Recurrent infections with current irritation and odor. Previous treatment addressed both conditions. Boric acid and probiotics recommended for pH balance. - Perform vaginal swab for yeast, bacterial vaginosis, gonorrhea, and chlamydia. - Recommend boric acid vaginal suppositories twice a week. - Consider adding a probiotic. - Advise wearing cotton underwear and using gentle, fragrance-free products.

## 2024-02-17 NOTE — Assessment & Plan Note (Signed)
 Had gestational diabetes. Strong family history of DM2.   Lab Results  Component Value Date   HGBA1C 5.6 12/28/2023   Last A1C WNL.  Discussed importance of healthy diet, continued exercise and weight management.

## 2024-02-17 NOTE — Patient Instructions (Signed)
 VISIT SUMMARY:  Today, we addressed your postpartum concerns, including anxiety, menstrual irregularities, recurrent infections, asthma, and occasional heartburn. We also discussed general health maintenance.  YOUR PLAN:  POSTPARTUM ANXIETY: You are experiencing anxiety after childbirth, which includes difficulty concentrating and feeling overwhelmed. -Continue therapy sessions every other week. -Engage in regular exercise.  IRREGULAR MENSTRUAL CYCLE WITH DYSMENORRHEA: You have irregular menstrual cycles and severe cramps since giving birth. -Consider starting oral contraceptive pills to help regulate your cycle and relieve cramps. -Discuss the benefits of oral contraceptives for menstrual regulation and cramp relief.  RECURRENT BACTERIAL VAGINOSIS AND YEAST INFECTIONS: You have recurrent infections with irritation and odor, especially after activity. -We will perform a vaginal swab to check for yeast, bacterial vaginosis, gonorrhea, and chlamydia. -Use boric acid suppositories twice a week. -Consider adding a probiotic to your routine. -Wear cotton underwear and use gentle, fragrance-free products.  EXERCISE-INDUCED ASTHMA: You experience coughing and wheezing during exercise. -Use your inhaler as needed, especially before exercise. -Consider maintenance medication if you need to use the inhaler frequently.  GASTROESOPHAGEAL REFLUX DISEASE (GERD): You have occasional heartburn that you manage with dietary changes. -Continue with your dietary modifications. -Consider taking Pepcid or methotrexate if symptoms persist.  GENERAL HEALTH MAINTENANCE: We discussed your overall health and preventive care. -Schedule a dental appointment. -Attend your upcoming eye exam. -Continue regular exercise and maintain a healthy lifestyle.

## 2024-02-17 NOTE — Assessment & Plan Note (Signed)
  Irregular cycles with severe dysmenorrhea post-pregnancy. Oral contraceptives considered for cycle regulation and cramp relief. - Consider starting oral contraceptive pills. (Already has on hand from GYN). - Discuss benefits of oral contraceptives for menstrual regulation and cramp relief.

## 2024-02-17 NOTE — Progress Notes (Signed)
 Subjective:     Patient ID: Pamela Klein, female    DOB: 02-Jun-1991, 33 y.o.   MRN: [id number]  Chief Complaint  Patient presents with   New Patient (Initial Visit)    No pcp in about 4 years     HPI  Discussed the use of AI scribe software for clinical note transcription with the patient, who gave verbal consent to proceed.  History of Present Illness  Pamela Klein is a 33 year old female who presents today to establish care.  She experiences irregular menstrual cycles and severe cramps postpartum, with cramps being significantly more intense than before pregnancy. She uses condoms for contraception due to past adverse reactions to hormonal birth control.  She has recurrent bacterial vaginosis and yeast infections, with increased irritation and a faint odor postpartum, especially after activity. She is concerned about her pH balance. No urinary issues other than occasional irritability and faint odor.  She has asthma with a cough and wheezing during physical activity. She has an inhaler for as-needed use but has not used it yet. Symptoms have increased since resuming regular exercise.  She experiences postpartum anxiety, leading to a break from work. She is in therapy and has a strong support system. Anxiety symptoms include trouble concentrating and feeling overwhelmed.  She has a history of vitamin D deficiency and gestational diabetes. She is aware of her increased risk for type 2 diabetes due to family history. Her last A1c was checked in April 2025.  She has occasional headaches relieved by ibuprofen  and occasional heartburn, avoiding trigger foods. No unusual muscle or joint pain, no swelling in legs, and no current hearing or vision concerns beyond needing glasses.     Health Maintenance Due  Topic Date Due   HPV VACCINES (1 - 3-dose series) Never done   Pneumococcal Vaccine 24-67 Years old (2 of 2 - PCV) 05/02/2014   COVID-19 Vaccine (3 - 2024-25  season) 05/08/2023    Past Medical History:  Diagnosis Date   Cannabis abuse 05/01/2013   Chlamydia    Chronic back pain    Community acquired pneumonia 05/01/2013   Gestational diabetes    Sepsis (HCC) 05/01/2013   Pt thinks related to pneumonia   Vitamin D deficiency 11/28/2017    Past Surgical History:  Procedure Laterality Date   NO PAST SURGERIES      Family History  Problem Relation Age of Onset   Hypertension Father    Diabetes Paternal Grandmother        diet control   Cataracts Paternal Grandfather     Social History   Socioeconomic History   Marital status: Single    Spouse name: Not on file   Number of children: Not on file   Years of education: Not on file   Highest education level: Master's degree (e.g., MA, MS, MEng, MEd, MSW, MBA)  Occupational History   Not on file  Tobacco Use   Smoking status: Former    Current packs/day: 0.00    Types: Cigarettes    Start date: 04/05/2008    Quit date: 04/06/2011    Years since quitting: 12.8   Smokeless tobacco: Never   Tobacco comments:    05/01/2013 smoked Black [place] Milds  Vaping Use   Vaping status: Never Used  Substance and Sexual Activity   Alcohol use: Yes    Alcohol/week: 2.0 standard drinks of alcohol    Types: 2 Glasses of wine per week    Comment: socially  Drug use: Not Currently   Sexual activity: Yes    Partners: Male    Birth control/protection: Condom    Comment: not since July  Other Topics Concern   Not on file  Social History Narrative   Mental Health Clinical Social Worker   Daughter 2013 Nevaeh   Son 2025 [name]   Single   Enjoys exercise, cooking, reading   Completed Masters degree   Social Drivers of Health   Financial Resource Strain: Low Risk  (02/16/2024)   Overall Financial Resource Strain (CARDIA)    Difficulty of Paying Living Expenses: Not very hard  Food Insecurity: No Food Insecurity (02/16/2024)   Hunger Vital Sign    Worried About Running Out of Food in the  Last Year: Never true    Ran Out of Food in the Last Year: Never true  Transportation Needs: No Transportation Needs (02/16/2024)   PRAPARE - Administrator, Civil Service (Medical): No    Lack of Transportation (Non-Medical): No  Physical Activity: Sufficiently Active (02/17/2024)   Exercise Vital Sign    Days of Exercise per Week: 7 days    Minutes of Exercise per Session: 40 min  Stress: Patient Declined (02/16/2024)   [employer] of Occupational Health - Occupational Stress Questionnaire    Feeling of Stress: Patient declined  Social Connections: Moderately Isolated (02/16/2024)   Social Connection and Isolation Panel    Frequency of Communication with Friends and Family: More than three times a week    Frequency of Social Gatherings with Friends and Family: More than three times a week    Attends Religious Services: 1 to 4 times per year    Active Member of [employer] or Organizations: No    Attends Engineer, structural: Not on file    Marital Status: Never married  Intimate Partner Violence: Not At Risk (10/05/2023)   Humiliation, Afraid, Rape, and Kick questionnaire    Fear of Current or Ex-Partner: No    Emotionally Abused: No    Physically Abused: No    Sexually Abused: No    Outpatient Medications Prior to Visit  Medication Sig Dispense Refill   ibuprofen  (ADVIL ) 800 MG tablet Take 1 tablet (800 mg total) by mouth 3 (three) times daily as needed for moderate pain (pain score 4-6), mild pain (pain score 1-3) or cramping. 60 tablet 3   albuterol  (VENTOLIN  HFA) 108 (90 Base) MCG/ACT inhaler Inhale 2 puffs into the lungs every 6 (six) hours as needed for wheezing or shortness of breath. (Patient not taking: Reported on 12/28/2023) 18 g 0   metroNIDAZOLE  (METROGEL ) 0.75 % vaginal gel Place 1 Applicatorful vaginally at bedtime. Apply one applicatorful to vagina at bedtime for 5 days 70 g 1   norethindrone  (MICRONOR ) 0.35 MG tablet Take 1 tablet (0.35 mg total) by  mouth daily. (Patient not taking: Reported on 12/28/2023) 90 tablet 3   Prenatal Vit-Fe Fumarate-FA (MULTIVITAMIN-PRENATAL) 27-0.8 MG TABS tablet Take 1 tablet by mouth daily at 12 noon.     No facility-administered medications prior to visit.    Not on File  Review of Systems  Constitutional:  Negative for weight loss.  HENT:  Negative for congestion and hearing loss.   Eyes:  Negative for blurred vision.  Respiratory:  Positive for cough (comes and goes, does have an inhaler).   Cardiovascular:  Negative for leg swelling.  Gastrointestinal:  Positive for heartburn.  Genitourinary:  Negative for dysuria and frequency.  Musculoskeletal:  Negative for  joint pain and myalgias.  Skin:  Negative for rash.  Neurological:  Negative for headaches.  Psychiatric/Behavioral:         See HPI       Objective:    Physical Exam   BP 131/74 (BP Location: Right Arm, Patient Position: Sitting, Cuff Size: Normal)   Pulse 82   Temp 98.6 F (37 C) (Oral)   Resp 16   Ht 5' 5 (1.651 m)   Wt 210 lb (95.3 kg)   SpO2 100%   BMI 34.95 kg/m  Wt Readings from Last 3 Encounters:  02/17/24 210 lb (95.3 kg)  12/28/23 203 lb (92.1 kg)  10/13/23 199 lb (90.3 kg)   Physical Exam  Constitutional: She is oriented to person, place, and time. She appears well-developed and well-nourished. No distress.  HENT:  Head: Normocephalic and atraumatic.  Right Ear: Tympanic membrane and ear canal normal.  Left Ear: Tympanic membrane and ear canal normal.  Mouth/Throat: Oropharynx is clear and moist.  Eyes: Pupils are equal, round, and reactive to light. No scleral icterus.  Neck: Normal range of motion. No thyromegaly present.  Cardiovascular: Normal rate and regular rhythm.   No murmur heard. Pulmonary/Chest: Effort normal and breath sounds normal. No respiratory distress. He has no wheezes. She has no rales. She exhibits no tenderness.  Abdominal: Soft. Bowel sounds are normal. She exhibits no distension  and no mass. There is no tenderness. There is no rebound and no guarding.  Musculoskeletal: She exhibits no edema.  Lymphadenopathy:    She has no cervical adenopathy.  Neurological: She is alert and oriented to person, place, and time. She has normal patellar reflexes. She exhibits normal muscle tone. Coordination normal.  Skin: Skin is warm and dry.  Psychiatric: She has a normal mood and affect. Her behavior is normal. Judgment and thought content normal.  External genitalia: Normal  BUS/Urethra/Skene's glands: Normal  Bladder: Normal  Vagina: Normal  Anus and perineum: Normal            Assessment & Plan:       Assessment & Plan:   Problem List Items Addressed This Visit       Unprioritized   Preventative health care - Primary    General Health Maintenance Up to date on Pap smear. Dental and vision care updates in progress. Regular exercise and healthy lifestyle encouraged. - Schedule dental appointment. - Attend upcoming eye exam. - Encourage regular exercise and healthy lifestyle.      Relevant Orders   Lipid panel   TSH   Prediabetes   Had gestational diabetes. Strong family history of DM2.   Lab Results  Component Value Date   HGBA1C 5.6 12/28/2023   Last A1C WNL.  Discussed importance of healthy diet, continued exercise and weight management.       Relevant Orders   Comp Met (CMET)   Dysmenorrhea    Irregular cycles with severe dysmenorrhea post-pregnancy. Oral contraceptives considered for cycle regulation and cramp relief. - Consider starting oral contraceptive pills. (Already has on hand from GYN). - Discuss benefits of oral contraceptives for menstrual regulation and cramp relief.      Cough    Intermittent cough and wheezing during exercise. Inhaler available but unused. Pre-exercise inhaler use recommended. - Use inhaler as needed, especially before exercise. - Consider maintenance medication if inhaler use becomes frequent.       Relevant Medications   albuterol  (VENTOLIN  HFA) 108 (90 Base) MCG/ACT inhaler   Anxiety  Postpartum anxiety with presented initially with difficulty concentrating and feeling overwhelmed, related to separation from children and transitions. Therapy and support system in place. Symptoms improving now that she has the summer off. - Continue therapy sessions every other week. - Engage in regular exercise.      Acute vaginitis    Recurrent infections with current irritation and odor. Previous treatment addressed both conditions. Boric acid and probiotics recommended for pH balance. - Perform vaginal swab for yeast, bacterial vaginosis, gonorrhea, and chlamydia. - Recommend boric acid vaginal suppositories twice a week. - Consider adding a probiotic. - Advise wearing cotton underwear and using gentle, fragrance-free products.      Relevant Orders   Cervicovaginal ancillary only( [place])   Other Visit Diagnoses       Anemia, unspecified type       Relevant Orders   CBC w/Diff       I have discontinued Mercede Kaminsky Aqua's multivitamin-prenatal, norethindrone , and metroNIDAZOLE . I am also having her maintain her ibuprofen  and albuterol .  Meds ordered this encounter  Medications   albuterol  (VENTOLIN  HFA) 108 (90 Base) MCG/ACT inhaler    Sig: Inhale 2 puffs into the lungs every 6 (six) hours as needed for wheezing or shortness of breath.    Supervising Provider:   Randie Bustle A [phone]

## 2024-02-20 ENCOUNTER — Ambulatory Visit: Payer: Self-pay

## 2024-02-20 ENCOUNTER — Other Ambulatory Visit: Payer: Self-pay

## 2024-02-20 DIAGNOSIS — E0590 Thyrotoxicosis, unspecified without thyrotoxic crisis or storm: Secondary | ICD-10-CM

## 2024-02-20 DIAGNOSIS — E039 Hypothyroidism, unspecified: Secondary | ICD-10-CM

## 2024-02-20 LAB — CERVICOVAGINAL ANCILLARY ONLY
Bacterial Vaginitis (gardnerella): NEGATIVE
Candida Glabrata: NEGATIVE
Candida Vaginitis: NEGATIVE
Chlamydia: NEGATIVE
Comment: NEGATIVE
Comment: NEGATIVE
Comment: NEGATIVE
Comment: NEGATIVE
Comment: NEGATIVE
Comment: NORMAL
Neisseria Gonorrhea: NEGATIVE
Trichomonas: NEGATIVE

## 2024-02-22 ENCOUNTER — Other Ambulatory Visit

## 2024-02-22 LAB — COMPREHENSIVE METABOLIC PANEL WITH GFR
AG Ratio: 1.8 (calc) (ref 1.0–2.5)
ALT: 11 U/L (ref 6–29)
AST: 11 U/L (ref 10–30)
Albumin: 4.3 g/dL (ref 3.6–5.1)
Alkaline phosphatase (APISO): 78 U/L (ref 31–125)
BUN: 11 mg/dL (ref 7–25)
CO2: 24 mmol/L (ref 20–32)
Calcium: 9 mg/dL (ref 8.6–10.2)
Chloride: 105 mmol/L (ref 98–110)
Creat: 0.59 mg/dL (ref 0.50–0.97)
Globulin: 2.4 g/dL (ref 1.9–3.7)
Glucose, Bld: 85 mg/dL (ref 65–99)
Potassium: 4.2 mmol/L (ref 3.5–5.3)
Sodium: 139 mmol/L (ref 135–146)
Total Bilirubin: 0.3 mg/dL (ref 0.2–1.2)
Total Protein: 6.7 g/dL (ref 6.1–8.1)
eGFR: 123 mL/min/{1.73_m2} (ref >=60)

## 2024-02-22 LAB — LIPID PANEL
Cholesterol: 258 mg/dL — ABNORMAL HIGH (ref <200)
HDL: 55 mg/dL (ref >=50)
LDL Cholesterol (Calc): 186 mg/dL — ABNORMAL HIGH
Non-HDL Cholesterol (Calc): 203 mg/dL — ABNORMAL HIGH (ref <130)
Total CHOL/HDL Ratio: 4.7 (calc) (ref <5.0)
Triglycerides: 72 mg/dL (ref <150)

## 2024-02-22 LAB — CBC WITH DIFFERENTIAL/PLATELET
Absolute Lymphocytes: 2285 {cells}/uL (ref 850–3900)
Absolute Monocytes: 708 {cells}/uL (ref 200–950)
Basophils Absolute: 22 {cells}/uL (ref 0–200)
Basophils Relative: 0.3 %
Eosinophils Absolute: 58 {cells}/uL (ref 15–500)
Eosinophils Relative: 0.8 %
HCT: 39.9 % (ref 35.0–45.0)
Hemoglobin: 13.1 g/dL (ref 11.7–15.5)
MCH: 29.2 pg (ref 27.0–33.0)
MCHC: 32.8 g/dL (ref 32.0–36.0)
MCV: 88.9 fL (ref 80.0–100.0)
MPV: 11.1 fL (ref 7.5–12.5)
Monocytes Relative: 9.7 %
Neutro Abs: 4227 {cells}/uL (ref 1500–7800)
Neutrophils Relative %: 57.9 %
Platelets: 290 10*3/uL (ref 140–400)
RBC: 4.49 10*6/uL (ref 3.80–5.10)
RDW: 13.7 % (ref 11.0–15.0)
Total Lymphocyte: 31.3 %
WBC: 7.3 10*3/uL (ref 3.8–10.8)

## 2024-02-22 LAB — TEST AUTHORIZATION 2

## 2024-02-22 LAB — T4: T4, Total: 6.7 ug/dL (ref 5.1–11.9)

## 2024-02-22 LAB — TEST AUTHORIZATION

## 2024-02-22 LAB — T4, FREE: Free T4: 1.3 ng/dL (ref 0.8–1.8)

## 2024-02-22 LAB — TSH: TSH: 0.35 m[IU]/L — ABNORMAL LOW

## 2024-02-23 NOTE — Addendum Note (Signed)
 Addended by: Dorrene Gaucher on: 02/23/2024 02:16 PM   Modules accepted: Orders

## 2024-02-23 NOTE — Telephone Encounter (Signed)
 Cholesterol is high. Please work on low fat/low cholesterol diet, exercise, weight loss.  Lab work suggests very mildly overactive thyroid.  Repeat thyroid studies in 3 months.   Anemia is resolved.

## 2024-02-24 NOTE — Telephone Encounter (Signed)
Called a few times but no answer

## 2024-02-27 NOTE — Progress Notes (Signed)
 Called patient but no answer, left voice mail for patient to call back.

## 2024-02-28 NOTE — BH Specialist Note (Deleted)
 Integrated Behavioral Health via Telemedicine Visit  02/28/2024 Pamela Klein [id number]  Number of Integrated Behavioral Health Clinician visits: 1- Initial Visit  Session Start time: 1017   Session End time: 1052  Total time in minutes: 35    Referring Provider: *** Patient/Family location: Home*** [facility] Provider location: [facility]  All persons participating in visit: [name] and [facility] Pamela Klein ***  Types of Service: {CHL AMB TYPE OF SERVICE:[phone]}  I connected with Pamela Klein and/or Pamela Klein's {family members:20773} via  Telephone or Engineer, civil (consulting)  (Video is Caregility application) and verified that I am speaking with the correct person using two identifiers. Discussed confidentiality: Yes   I discussed the limitations of telemedicine and the availability of in person appointments.  Discussed there is a possibility of technology failure and discussed alternative modes of communication if that failure occurs.  I discussed that engaging in this telemedicine visit, they consent to the provision of behavioral healthcare and the services will be billed under their insurance.  Patient and/or legal guardian expressed understanding and consented to Telemedicine visit: Yes   Presenting Concerns: Patient and/or family reports the following symptoms/concerns: *** Duration of problem: ***; Severity of problem: {Mild/Moderate/Severe:20260}  Patient and/or Family's Strengths/Protective Factors: {CHL AMB BH PROTECTIVE FACTORS:[phone]}  Goals Addressed: Patient will:  Reduce symptoms of: {IBH Symptoms:21014056}   Increase knowledge and/or ability of: {IBH Patient Tools:21014057}   Demonstrate ability to: {IBH Goals:21014053}  Progress towards Goals: {CHL AMB BH PROGRESS TOWARDS GOALS:[phone]}    Interventions: Interventions utilized:  {IBH  Interventions:21014054} Standardized Assessments completed: {IBH Screening Tools:21014051}    Patient and/or Family Response: Patient agrees with treatment plan. ***  Clinical Assessment/Diagnosis  No diagnosis found.    Patient may benefit from continued therapeutic intervention *** .  Plan: Follow up with behavioral health clinician on : *** Behavioral recommendations:  -*** -*** Referral(s): {IBH Referrals:21014055}  I discussed the assessment and treatment plan with the patient and/or parent/guardian. They were provided an opportunity to ask questions and all were answered. They agreed with the plan and demonstrated an understanding of the instructions.   They were advised to call back or seek an in-person evaluation if the symptoms worsen or if the condition fails to improve as anticipated.  Pamela JAYSON Mering, LCSW     02/17/2024   11:15 AM 09/12/2023    9:37 AM 04/07/2023    2:47 PM  Depression screen PHQ 2/9  Decreased Interest 0 0 0  Down, Depressed, Hopeless 0 0 0  PHQ - 2 Score 0 0 0  Altered sleeping 0 1 2  Tired, decreased energy 0 1 1  Change in appetite 0 1 1  Feeling bad or failure about yourself  0 0 0  Trouble concentrating 0 0 0  Moving slowly or fidgety/restless 0 0 0  Suicidal thoughts 0 0 0  PHQ-9 Score 0 3 4  Difficult doing work/chores Not difficult at all        04/07/2023    2:47 PM  GAD 7 : Generalized Anxiety Score  Nervous, Anxious, on Edge 1  Control/stop worrying 0  Worry too much - different things 0  Trouble relaxing 1  Restless 0  Easily annoyed or irritable 0  Afraid - awful might happen 0  Total GAD 7 Score 2

## 2024-02-29 ENCOUNTER — Encounter

## 2024-03-06 ENCOUNTER — Other Ambulatory Visit

## 2024-03-07 ENCOUNTER — Other Ambulatory Visit

## 2024-03-07 DIAGNOSIS — Z006 Encounter for examination for normal comparison and control in clinical research program: Secondary | ICD-10-CM

## 2024-03-07 NOTE — Addendum Note (Signed)
 Addended by: KOWACICH-SWANEY, DANNIELLE J on: 03/07/2024 10:00 AM   Modules accepted: Orders

## 2024-03-20 ENCOUNTER — Ambulatory Visit

## 2024-03-20 DIAGNOSIS — F4323 Adjustment disorder with mixed anxiety and depressed mood: Secondary | ICD-10-CM

## 2024-03-20 NOTE — Patient Instructions (Signed)
 [facility] [address] [place], [place] [address] [phone] (main office) [phone] [facility] office)  Psychology Today (use search to filter for couple therapy of choice) [email or web address]

## 2024-03-20 NOTE — BH Specialist Note (Signed)
 Integrated Behavioral Health via Telemedicine Visit  03/20/2024 Pamela Klein [id number]  Number of Integrated Behavioral Health Clinician visits: 2- Second Visit  Session Start time: 1018   Session End time: 1108  Total time in minutes: 50    Referring Provider: Lang Peel, DO Patient/Family location: Home [facility] Provider location: [facility]  All persons participating in visit: [name] and [facility] Pamela Klein   Types of Service: Individual psychotherapy and Video visit  I connected with [name] and/or Ryelle Paulos's n/a via  Telephone or Engineer, civil (consulting)  (Video is Caregility application) and verified that I am speaking with the correct person using two identifiers. Discussed confidentiality: Yes   I discussed the limitations of telemedicine and the availability of in person appointments.  Discussed there is a possibility of technology failure and discussed alternative modes of communication if that failure occurs.  I discussed that engaging in this telemedicine visit, they consent to the provision of behavioral healthcare and the services will be billed under their insurance.  Patient and/or legal guardian expressed understanding and consented to Telemedicine visit: Yes   Presenting Concerns: Patient and/or family reports the following symptoms/concerns: Navigating relationship issues with FOB; using self-coping strategies effectively to manage anxious thoughts.  Duration of problem: Perinatal to current; Severity of problem: moderate  Patient and/or Family's Strengths/Protective Factors: Social connections, Social and Emotional competence, Concrete supports in place (healthy food, safe environments, etc.), Sense of purpose, and Physical Health (exercise, healthy diet, medication compliance, etc.)  Goals Addressed: Patient will:  Reduce symptoms of: anxiety,  depression, and stress   Increase knowledge and/or ability of: self-management skills   Demonstrate ability to: Increase healthy adjustment to current life circumstances  Progress towards Goals: Ongoing    Interventions: Interventions utilized:  Link to [employer] and Supportive Reflection Standardized Assessments completed: Not Needed    Patient and/or Family Response: Patient agrees with treatment plan.   Clinical Assessment/Diagnosis  Adjustment disorder with mixed anxiety and depressed mood    Patient may benefit from continued therapeutic intervention  .  Plan: Follow up with behavioral health clinician on : Three weeks Behavioral recommendations:  -Continue using daily self-coping strategies as needed (grounding exercises, worry time, journal, exercise, quality time with children, etc.) -Continue plan to attend upcoming birthday trip; consider open discussion with baby's father regarding concerns; consider couple's therapy, as needed  Referral(s): Integrated Behavioral Health Services (In Clinic) and Couple therapy  I discussed the assessment and treatment plan with the patient and/or parent/guardian. They were provided an opportunity to ask questions and all were answered. They agreed with the plan and demonstrated an understanding of the instructions.   They were advised to call back or seek an in-person evaluation if the symptoms worsen or if the condition fails to improve as anticipated.  [name], LCSW     02/17/2024   11:15 AM 09/12/2023    9:37 AM 04/07/2023    2:47 PM  Depression screen PHQ 2/9  Decreased Interest 0 0 0  Down, Depressed, Hopeless 0 0 0  PHQ - 2 Score 0 0 0  Altered sleeping 0 1 2  Tired, decreased energy 0 1 1  Change in appetite 0 1 1  Feeling bad or failure about yourself  0 0 0  Trouble concentrating 0 0 0  Moving slowly or fidgety/restless 0 0 0  Suicidal thoughts 0 0 0  PHQ-9 Score 0 3 4  Difficult  doing  work/chores Not difficult at all        04/07/2023    2:47 PM  GAD 7 : Generalized Anxiety Score  Nervous, Anxious, on Edge 1  Control/stop worrying 0  Worry too much - different things 0  Trouble relaxing 1  Restless 0  Easily annoyed or irritable 0  Afraid - awful might happen 0  Total GAD 7 Score 2

## 2024-03-23 LAB — GENECONNECT MOLECULAR SCREEN

## 2024-03-27 ENCOUNTER — Ambulatory Visit: Payer: Self-pay

## 2024-03-27 ENCOUNTER — Other Ambulatory Visit: Payer: Self-pay | Admitting: Medical Genetics

## 2024-03-27 DIAGNOSIS — Z006 Encounter for examination for normal comparison and control in clinical research program: Secondary | ICD-10-CM

## 2024-03-27 NOTE — Telephone Encounter (Signed)
 [place] [facility]  03/27/2024 11:27 AM  Confirmed I was speaking with [name] [id number] by using name and DOB. Informed participant the reason for this call is to follow-up on a recent sample the participant provided at one of the [facility] lab locations. Informed participant the test was not able to be completed with this sample and apologized for the inconvenience. Participant was requested to provide a new sample at one of our participating labs at no cost so that participant can continue participation and receive test results. Informed participant they do not need to be fasting and if there are other samples that need to be drawn, they can be done at the same visit. Participant has not had a blood transfusion or blood product in the last 30 days. Participant agreed to provide another sample. Participant was provided the [employer] program website to learn why this may have happened. Participant was thanked for their time and continued support of the above study.    Jordyn Pennstrom, BS [place]  [facility] Clinical Research Specialist II Direct Dial: [phone]  Fax: [phone]

## 2024-04-11 ENCOUNTER — Ambulatory Visit

## 2024-04-11 NOTE — BH Specialist Note (Unsigned)
 Error cancelled

## 2024-04-12 ENCOUNTER — Other Ambulatory Visit (HOSPITAL_BASED_OUTPATIENT_CLINIC_OR_DEPARTMENT_OTHER): Payer: Self-pay

## 2024-04-12 ENCOUNTER — Other Ambulatory Visit (HOSPITAL_COMMUNITY)
Admission: RE | Admit: 2024-04-12 | Discharge: 2024-04-12 | Disposition: A | Discharge status: Home / Self Care | Source: Ambulatory Visit | Attending: Student | Admitting: Student

## 2024-04-12 ENCOUNTER — Ambulatory Visit (INDEPENDENT_AMBULATORY_CARE_PROVIDER_SITE_OTHER): Admitting: Student

## 2024-04-12 ENCOUNTER — Ambulatory Visit: Payer: Self-pay

## 2024-04-12 ENCOUNTER — Encounter: Payer: Self-pay | Admitting: Student

## 2024-04-12 VITALS — BP 138/86 | HR 70 | Temp 98.2°F | Resp 12 | Ht 65.0 in | Wt 208.4 lb

## 2024-04-12 DIAGNOSIS — N898 Other specified noninflammatory disorders of vagina: Secondary | ICD-10-CM | POA: Insufficient documentation

## 2024-04-12 DIAGNOSIS — A09 Infectious gastroenteritis and colitis, unspecified: Secondary | ICD-10-CM | POA: Insufficient documentation

## 2024-04-12 MED ORDER — LOPERAMIDE HCL 2 MG PO TABS
2.0000 mg | ORAL_TABLET | Freq: Three times a day (TID) | ORAL | 0 refills | Status: AC | PRN
  Filled 2024-04-12: qty 24, 8d supply, fill #0

## 2024-04-12 MED ORDER — AZITHROMYCIN 500 MG PO TABS
500.0000 mg | ORAL_TABLET | Freq: Every day | ORAL | 0 refills | Status: AC
  Filled 2024-04-12: qty 3, 3d supply, fill #0

## 2024-04-12 NOTE — Progress Notes (Signed)
 Acute Office Visit  Subjective:     Patient ID: Pamela Klein, female    DOB: Apr 27, 1991, 33 y.o.   MRN: [id number]  Chief Complaint  Patient presents with   Diarrhea   vomiting and nausea   Abdominal Pain    HPI Patient is in today for N/V/D.  Onset 5 days ago.  He recently traveled to [place] and is experiencing stomach pain, cramps, and diarrhea and vomiting.  Vomiting x 1 in past 24 hours. Reports moderate diarrhea-4 loose stools over the past 2 days.  Reports moderate abdominal pain.  She has been able to orally hydrate.  Able to keep fluids down.  Additional c/o vaginal discharge and itching. Denies new sexual partners. Denies dysuria, hematuria.  Patient denies fever, chills, blood in stool, SOB, CP, palpitations, dyspnea, edema, HA, vision changes, urinary symptoms, rash, weight changes, and recent illness or hospitalizations.     ROS  See HPI    Objective:    BP 138/86 (BP Location: Right Arm, Patient Position: Sitting, Cuff Size: Normal)   Pulse 70   Temp 98.2 F (36.8 C) (Oral)   Resp 12   Ht 5' 5 (1.651 m)   Wt 208 lb 6.4 oz (94.5 kg)   LMP 03/28/2024   SpO2 96%   Breastfeeding No   BMI 34.68 kg/m    Physical Exam   General: No acute distress. Awake and conversant.  Respiratory: CTAB. Respirations are non-labored. No wheezing.  GI: Abdomen soft, non-distended, mild diffuse tenderness without rebound or guarding. Bowel sounds hyperactive. No hepatosplenomegaly. No signs of dehydration- mucous membranes moist, skin turgor  Psych: Alert and oriented. Cooperative CV: RRR. No murmur. No lower extremity edema.  Resp: CTAB    No results found for any visits on 04/12/24.      Assessment & Plan:   Problem List Items Addressed This Visit     Traveler's diarrhea - Primary   Relevant Medications   loperamide  (IMODIUM  A-D) 2 MG tablet   azithromycin  (ZITHROMAX ) 500 MG tablet   Other Relevant Orders   Cdiff NAA+O+P+Stool Culture    Vaginal discharge   Carvicovaginal swab pending. Will Tx based on results.       Relevant Orders   Cervicovaginal ancillary only    Rx: Azithromycin , loperamide . Advised OTC use of Pepto-Bismol for symptomatic relief of upset stomach. Encouraged adequate hydration with clear fluids, electrolyte-rich drinks (e.g., Pedialyte, Gatorade), and bland foods (e.g., bananas, rice, applesauce, toast). Avoiding dairy, caffeine , and alcohol until symptoms resolve.Emphasized the importance of frequent and thorough handwashing with soap and water to prevent the spread of infection. Hand hygiene is especially important after using the bathroom and before eating or preparing food. RTC for persistent of worsening symptoms. High fever, abdominal pain, bloody diarrhea, or dehydration unresponsive to oral rehydration  Meds ordered this encounter  Medications   loperamide  (IMODIUM  A-D) 2 MG tablet    Sig: Take 1 tablet (2 mg total) by mouth 3 (three) times daily as needed for diarrhea or loose stools.    Dispense:  21 tablet    Refill:  0    Supervising Provider:   DOMENICA BLACKBIRD A [[id number]]   azithromycin  (ZITHROMAX ) 500 MG tablet    Sig: Take 1 500 mg tab po x 3 days    Dispense:  3 tablet    Refill:  0    Supervising Provider:   DOMENICA BLACKBIRD A [[id number]]    No follow-ups on file.  Zilla Shartzer L Jaxson Keener, NP

## 2024-04-12 NOTE — Assessment & Plan Note (Signed)
 Carvicovaginal swab pending. Will Tx based on results.

## 2024-04-12 NOTE — Telephone Encounter (Signed)
 FYI Only or Action Required?: FYI only for provider.  Patient was last seen in primary care on 02/17/2024 by Daryl Setter, NP.  Called Nurse Triage reporting Diarrhea.Vomiting, stomach cramps  Symptoms began several days ago.  Interventions attempted: Nothing.  Symptoms are: unchanged.  Triage Disposition: See Physician Within 24 Hours  Patient/caregiver understands and will follow disposition?: Yes  Patient is experiencing stomach pain and cramps along with uncontrolled diarrhea and vomiting.   Reason for Disposition  [1] MODERATE diarrhea (e.g., 4-6 times / day more than normal) AND [2] present > 48 hours (2 days)  Answer Assessment - Initial Assessment Questions 1. DIARRHEA SEVERITY: How bad is the diarrhea? How many more stools have you had in the past 24 hours than normal?      Multiple bouts of diarrhea daily for the past five days 2. ONSET: When did the diarrhea begin?      Saturday-5 days ago 3. STOOL DESCRIPTION:  How loose or watery is the diarrhea? What is the stool color? Is there any blood or mucous in the stool?     Watery, no mucous or blood 4. VOMITING: Are you also vomiting? If Yes, ask: How many times in the past 24 hours?      x1 5. ABDOMEN PAIN: Are you having any abdomen pain? If Yes, ask: What does it feel like? (e.g., crampy, dull, intermittent, constant)      Intermittent abdominal pain, mainly at night 6. ABDOMEN PAIN SEVERITY: If present, ask: How bad is the pain?  (e.g., Scale 1-10; mild, moderate, or severe)     9/10 7. ORAL INTAKE: If vomiting, Have you been able to drink liquids? How much liquids have you had in the past 24 hours?     Keeping fluids down. Still eating, but reduced amount 8. HYDRATION: Any signs of dehydration? (e.g., dry mouth [not just dry lips], too weak to stand, dizziness, new weight loss) When did you last urinate?     denies 9. EXPOSURE: Have you traveled to a foreign country recently?  Have you been exposed to anyone with diarrhea? Could you have eaten any food that was spoiled?     Positive- travel to [place] 04/04/2024-04/08/2024 10. ANTIBIOTIC USE: Are you taking antibiotics now or have you taken antibiotics in the past 2 months?       denies 11. OTHER SYMPTOMS: Do you have any other symptoms? (e.g., fever, blood in stool)       Denies fever/blood.  Reports skin irritation from diarrhea  Protocols used: [facility]

## 2024-04-13 LAB — CERVICOVAGINAL ANCILLARY ONLY
Bacterial Vaginitis (gardnerella): POSITIVE — AB
Candida Glabrata: NEGATIVE
Candida Vaginitis: NEGATIVE
Chlamydia: NEGATIVE
Comment: NEGATIVE
Comment: NEGATIVE
Comment: NEGATIVE
Comment: NEGATIVE
Comment: NEGATIVE
Comment: NORMAL
Neisseria Gonorrhea: NEGATIVE
Trichomonas: NEGATIVE

## 2024-04-15 ENCOUNTER — Ambulatory Visit: Payer: Self-pay | Admitting: Student

## 2024-04-15 DIAGNOSIS — B9689 Other specified bacterial agents as the cause of diseases classified elsewhere: Secondary | ICD-10-CM

## 2024-04-15 MED ORDER — METRONIDAZOLE 0.75 % VA GEL: 1.0000 | Freq: Every day | VAGINAL | 0 refills | Status: AC

## 2024-04-17 LAB — CDIFF NAA+O+P+STOOL CULTURE
E coli, Shiga toxin Assay: NEGATIVE
Toxigenic C. Difficile by PCR: NEGATIVE

## 2024-04-17 NOTE — BH Specialist Note (Signed)
 Integrated Behavioral Health via Telemedicine Visit  04/19/2024 Pamela Klein [id number]  Number of Integrated Behavioral Health Clinician visits: 3- Third Visit  Session Start time: 0917   Session End time: 0948  Total time in minutes: 31  Referring Provider: Lang Peel, DO Patient/Family location: [facility] Provider location: [facility]  All persons participating in visit: [name] and [facility] Pamela Klein   Types of Service: Individual psychotherapy and Video visit  I connected with Pamela Klein and/or Pamela Klein's n/a via  Telephone or Engineer, civil (consulting)  (Video is Caregility application) and verified that I am speaking with the correct person using two identifiers. Discussed confidentiality: Yes   I discussed the limitations of telemedicine and the availability of in person appointments.  Discussed there is a possibility of technology failure and discussed alternative modes of communication if that failure occurs.  I discussed that engaging in this telemedicine visit, they consent to the provision of behavioral healthcare and the services will be billed under their insurance.  Patient and/or legal guardian expressed understanding and consented to Telemedicine visit: Yes   Presenting Concerns: Patient and/or family reports the following symptoms/concerns: Overall mood improvement post-travel; recognizing a greater need for self-care, positive self-talk and being mindful of her thoughts and emotions.  Duration of problem: Perinatal; Severity of problem: mild  Patient and/or Family's Strengths/Protective Factors: Social connections, Social and Emotional competence, Concrete supports in place (healthy food, safe environments, etc.), Sense of purpose, and Physical Health (exercise, healthy diet, medication compliance, etc.)  Goals Addressed: Patient will:  Reduce symptoms  of: anxiety, depression, and stress    Demonstrate ability to: Increase healthy adjustment to current life circumstances  Progress towards Goals: Ongoing    Interventions: Interventions utilized:  Motivational Interviewing and Supportive Reflection Standardized Assessments completed: Not Needed    Patient and/or Family Response: Patient agrees with treatment plan.   Clinical Assessment/Diagnosis  Adjustment disorder with mixed anxiety and depressed mood .   Patient may benefit from continued therapeutic intervention  .  Plan: Follow up with behavioral health clinician on : Three weeks Behavioral recommendations:  -Continue daily self-coping strategies as needed (grounding exercises, worry time, journaling, exercise, quality time with children) -Continue with renewed focus on positive self-talk and mindfulness -Continue job search for optimal work/life balance Referral(s): Integrated [employer] (In Clinic)  I discussed the assessment and treatment plan with the patient and/or parent/guardian. They were provided an opportunity to ask questions and all were answered. They agreed with the plan and demonstrated an understanding of the instructions.   They were advised to call back or seek an in-person evaluation if the symptoms worsen or if the condition fails to improve as anticipated.  Pamela JAYSON Mering, LCSW     02/17/2024   11:15 AM 09/12/2023    9:37 AM 04/07/2023    2:47 PM  Depression screen PHQ 2/9  Decreased Interest 0 0 0  Down, Depressed, Hopeless 0 0 0  PHQ - 2 Score 0 0 0  Altered sleeping 0 1 2  Tired, decreased energy 0 1 1  Change in appetite 0 1 1  Feeling bad or failure about yourself  0 0 0  Trouble concentrating 0 0 0  Moving slowly or fidgety/restless 0 0 0  Suicidal thoughts 0 0 0  PHQ-9 Score 0 3 4  Difficult doing work/chores Not difficult at all        04/07/2023  2:47 PM  GAD 7 : Generalized Anxiety Score  Nervous, Anxious,  on Edge 1  Control/stop worrying 0  Worry too much - different things 0  Trouble relaxing 1  Restless 0  Easily annoyed or irritable 0  Afraid - awful might happen 0  Total GAD 7 Score 2

## 2024-04-18 ENCOUNTER — Ambulatory Visit (INDEPENDENT_AMBULATORY_CARE_PROVIDER_SITE_OTHER): Admitting: Clinical

## 2024-04-18 DIAGNOSIS — F4323 Adjustment disorder with mixed anxiety and depressed mood: Secondary | ICD-10-CM

## 2024-04-19 NOTE — Patient Instructions (Signed)
 [facility] for [facility] at [facility] [address] [place], [place] [address] [phone] (main office) [phone] (Adalina Dopson's office)

## 2024-04-20 ENCOUNTER — Other Ambulatory Visit: Payer: Self-pay | Admitting: Obstetrics & Gynecology

## 2024-05-08 NOTE — BH Specialist Note (Unsigned)
 Integrated Behavioral Health via Telemedicine Visit  05/09/2024 Pamela Klein [id number]  Number of Integrated Behavioral Health Clinician visits: 4- Fourth Visit  Session Start time: 0921   Session End time: 0953  Total time in minutes: 32  Referring Provider: Lang Peel, DO Patient/Family location: Home [facility] Provider location: [facility]  All persons participating in visit: [name] and Pamela Klein Pamela Klein   Types of Service: Individual psychotherapy and Video visit  I connected with Pamela Klein and/or Pamela Klein's n/a via  Telephone or Engineer, civil (consulting)  (Video is Caregility application) and verified that I am speaking with the correct person using two identifiers. Discussed confidentiality: Yes   I discussed the limitations of telemedicine and the availability of in person appointments.  Discussed there is a possibility of technology failure and discussed alternative modes of communication if that failure occurs.  I discussed that engaging in this telemedicine visit, they consent to the provision of behavioral healthcare and the services will be billed under their insurance.  Patient and/or legal guardian expressed understanding and consented to Telemedicine visit: Yes   Presenting Concerns: Patient and/or family reports the following symptoms/concerns: Communication issues with partner; anxious and indecision regarding accepting new work position, as well as starting new business; overall mood improvement after implementing time for self-care and reflection postpartum.  Duration of problem: Postpartum; Severity of problem: moderate  Patient and/or Family's Strengths/Protective Factors: Social connections, Social and Emotional competence, Concrete supports in place (healthy food, safe environments, etc.), Sense of purpose, and Physical Health (exercise, healthy diet,  medication compliance, etc.)  Goals Addressed: Patient will:  Reduce symptoms of: anxiety, depression, and stress    Demonstrate ability to: Increase healthy adjustment to current life circumstances and Increase adequate support systems for patient/family  Progress towards Goals: Ongoing    Interventions: Interventions utilized:  Link to [employer] and Supportive Reflection Standardized Assessments completed: Not Needed    Patient and/or Family Response: Patient agrees with treatment plan.   Clinical Assessment/Diagnosis  Adjustment disorder with mixed anxiety and depressed mood    Patient may benefit from continued therapeutic intervention  .  Plan: Follow up with behavioral health clinician on : One month Behavioral recommendations:  -Continue daily self-coping strategies as needed (grounding exercises; worry time; journaling; exercise; quality time with children; positive self-talk; mindfulness) -Continue plan to attend appointment today prior to making final decision about work position -Consider using [email or web address] as additional resource for business -Continue to consider couple counseling; consider writing down emotions as they come after communicating with partner (wait to reread at a later date to notice any patterns, as discussed) Referral(s): Integrated [employer] (In Clinic) and [employer]:  [employer] business  I discussed the assessment and treatment plan with the patient and/or parent/guardian. They were provided an opportunity to ask questions and all were answered. They agreed with the plan and demonstrated an understanding of the instructions.   They were advised to call back or seek an in-person evaluation if the symptoms worsen or if the condition fails to improve as anticipated.  Pamela JAYSON Mering, LCSW     02/17/2024   11:15 AM 09/12/2023    9:37 AM 04/07/2023    2:47 PM  Depression screen PHQ 2/9  Decreased Interest 0 0 0   Down, Depressed, Hopeless 0 0 0  PHQ - 2 Score 0 0 0  Altered sleeping 0 1 2  Tired, decreased energy 0 1 1  Change in appetite 0 1 1  Feeling bad or failure about yourself  0 0 0  Trouble concentrating 0 0 0  Moving slowly or fidgety/restless 0 0 0  Suicidal thoughts 0 0 0  PHQ-9 Score 0 3 4  Difficult doing work/chores Not difficult at all        04/07/2023    2:47 PM  GAD 7 : Generalized Anxiety Score  Nervous, Anxious, on Edge 1  Control/stop worrying 0  Worry too much - different things 0  Trouble relaxing 1  Restless 0  Easily annoyed or irritable 0  Afraid - awful might happen 0  Total GAD 7 Score 2

## 2024-05-09 ENCOUNTER — Ambulatory Visit (INDEPENDENT_AMBULATORY_CARE_PROVIDER_SITE_OTHER): Admitting: Clinical

## 2024-05-09 DIAGNOSIS — F4323 Adjustment disorder with mixed anxiety and depressed mood: Secondary | ICD-10-CM

## 2024-05-09 NOTE — Patient Instructions (Signed)
[facility] at [facility] [address] [place], [place] [address] [phone] (main office) [phone] (Thuy Atilano's office)   SCORE: For the life of your business [email or web address]   [facility] [email or web address]   

## 2024-05-31 ENCOUNTER — Telehealth: Payer: Self-pay | Admitting: Family

## 2024-05-31 ENCOUNTER — Ambulatory Visit: Payer: Self-pay

## 2024-05-31 NOTE — Telephone Encounter (Signed)
 Called patient but no answer, left detail voice mail for patinet to call and schecule lab appointment, orders in as future since June

## 2024-05-31 NOTE — Telephone Encounter (Signed)
 FYI Only or Action Required?: FYI only for provider.  Patient was last seen in primary care on 04/12/2024 by Wheeler Pamela Klein CROME, NP.  Called Nurse Triage reporting Vaginal Discharge.  Symptoms began several days ago.  Interventions attempted: Nothing.  Symptoms are: unchanged.  Triage Disposition: See PCP When Office is Open (Within 3 Days)  Patient/caregiver understands and will follow disposition?: Yes Reason for Disposition  [1] Symptoms of a yeast infection (i.e., itchy, white discharge, not bad smelling) AND [2] not improved > 3 days following Care Advice  Answer Assessment - Initial Assessment Questions Feels irritable, some itchiness. Treated before and stated the cream does not work, feels like it takes away symptoms for a bit and then it comes back. BV and yeast seems to be a pattern.   1. DISCHARGE: Describe the discharge. (e.g., white, yellow, green, gray, foamy, cottage cheese-like)     White, milky looking  2. ODOR: Is there a bad odor?     Slight odor outside of norm  3. ONSET: When did the discharge begin?     Few days ago   4. RASH: Is there a rash in the genital area? If Yes, ask: Describe it. (e.g., redness, blisters, sores, bumps)     No  5. CAUSE: What do you think is causing the discharge? Have you had the same problem before? What happened then?     Reoccurant BV and Yeast   6. OTHER SYMPTOMS: Do you have any other symptoms? (e.g., fever, itching, urination pain, vaginal bleeding, vaginal foreign body)     No  Protocols used: Vaginal Discharge-A-AH  Copied from CRM #[id number]. Topic: Clinical - Red Word Triage >> May 31, 2024  1:32 PM Pamela Klein ORN wrote: Red Word that prompted transfer to Nurse Triage: vaginal irritation that has gotten worse over a few days.Says she may have yeast.

## 2024-05-31 NOTE — Telephone Encounter (Signed)
 Patient is due for follow up thyroid lab work. Please schedule lab appointment.

## 2024-06-01 ENCOUNTER — Encounter: Payer: Self-pay | Admitting: Student

## 2024-06-01 ENCOUNTER — Ambulatory Visit (INDEPENDENT_AMBULATORY_CARE_PROVIDER_SITE_OTHER): Admitting: Student

## 2024-06-01 VITALS — BP 133/84 | HR 82 | Ht 65.0 in | Wt 210.4 lb

## 2024-06-01 DIAGNOSIS — N898 Other specified noninflammatory disorders of vagina: Secondary | ICD-10-CM | POA: Diagnosis not present

## 2024-06-01 DIAGNOSIS — N912 Amenorrhea, unspecified: Secondary | ICD-10-CM | POA: Diagnosis not present

## 2024-06-01 LAB — POCT URINE PREGNANCY: Preg Test, Ur: NEGATIVE

## 2024-06-01 MED ORDER — METRONIDAZOLE 500 MG PO TABS: 500.0000 mg | ORAL_TABLET | Freq: Three times a day (TID) | ORAL | 0 refills | Status: AC

## 2024-06-01 NOTE — Progress Notes (Deleted)
   Acute Office Visit  Subjective:     Patient ID: Pamela Klein, female    DOB: Nov 20, 1990, 33 y.o.   MRN: [id number]  No chief complaint on file.   HPI  Patient presents with recurrent vaginal irritation and discharge. She reports white, milky discharge with mild odor that began a few days ago. She was Tx for BV last OV with metronidazole  gel. Symptoms include itchiness and irritation, which temporarily improve with topical treatment but recur shortly after. She denies rash, fever, dysuria, vaginal bleeding, or other associated symptoms. History is notable for recurrent episodes of bacterial vaginosis and yeast infections with similar presentations in the past.  Patient denies fever, chills, SOB, CP, palpitations, dyspnea, edema, HA, vision changes, N/V/D, abdominal pain, urinary symptoms, rash, weight changes, and recent illness or hospitalizations.   ROS  See HPI    Objective:    BP (!) 140/76   Pulse (!) 111   Ht 5' 5 (1.651 m)   Wt 210 lb 6.4 oz (95.4 kg)   LMP 04/27/2024 (Exact Date)   SpO2 100%   BMI 35.01 kg/m  {Vitals History (Optional):23777}  Physical Exam General: No acute distress. Awake and conversant.  Eyes: Normal conjunctiva, anicteric. Round symmetric pupils.  Respiratory: CTAB. Respirations are non-labored. No wheezing.  Skin: Warm. No rashes or ulcers.  Psych: Alert and oriented. Cooperative, Appropriate mood and affect, Normal judgment.  CV: RRR. No murmur. No lower extremity edema.  MSK: Normal ambulation. No clubbing or cyanosis.  Neuro:  CN II-XII grossly normal.    Results for orders placed or performed in visit on 06/01/24  POCT urine pregnancy  Result Value Ref Range   Preg Test, Ur Negative Negative        Assessment & Plan:   Problem List Items Addressed This Visit     Vaginal discharge - Primary   Relevant Medications   metroNIDAZOLE  (FLAGYL ) 500 MG tablet   Other Relevant Orders   Cervicovaginal ancillary only( [place])    Other Visit Diagnoses       Amenorrhea       Relevant Orders   POCT urine pregnancy (Completed)     Pt has Hx of recurrent BV, cervical swab pending. Will Tx empirically. Rx. Metronidazole  oral x 7 days, advise no ETOH while on this medication; Vaginal Boric Acid x 30 days, this is OTC. RTC if symptoms persist or worsen  Meds ordered this encounter  Medications   metroNIDAZOLE  (FLAGYL ) 500 MG tablet    Sig: Take 1 tablet (500 mg total) by mouth 3 (three) times daily for 7 days.    Dispense:  21 tablet    Refill:  0    Supervising Provider:   DOMENICA BLACKBIRD A [[id number]]    No follow-ups on file.  Pamela Sanjuan L Shain Pauwels, NP

## 2024-06-02 LAB — SURESWAB® ADVANCED VAGINITIS PLUS,TMA
C. trachomatis RNA, TMA: NOT DETECTED
CANDIDA SPECIES: NOT DETECTED
Candida glabrata: NOT DETECTED
N. gonorrhoeae RNA, TMA: NOT DETECTED
SURESWAB(R) ADV BACTERIAL VAGINOSIS(BV),TMA: NEGATIVE
TRICHOMONAS VAGINALIS (TV),TMA: NOT DETECTED

## 2024-06-04 ENCOUNTER — Ambulatory Visit: Payer: Self-pay | Admitting: Student

## 2024-06-05 NOTE — Telephone Encounter (Signed)
 Patient scheduled to come in tomorrow for tsh ordes where entere 6/19 tsh, t3, t4

## 2024-06-06 ENCOUNTER — Other Ambulatory Visit

## 2024-06-06 DIAGNOSIS — E0590 Thyrotoxicosis, unspecified without thyrotoxic crisis or storm: Secondary | ICD-10-CM

## 2024-06-06 NOTE — Addendum Note (Signed)
 Addended by: TRUDY CURVIN RAMAN on: 06/06/2024 02:54 PM   Modules accepted: Orders

## 2024-06-07 ENCOUNTER — Ambulatory Visit: Payer: Self-pay | Admitting: Family

## 2024-06-07 LAB — T3: T3, Total: 96 ng/dL (ref 76–181)

## 2024-06-07 LAB — TSH: TSH: 0.78 m[IU]/L

## 2024-06-07 LAB — T4, FREE: Free T4: 1.1 ng/dL (ref 0.8–1.8)

## 2024-06-12 ENCOUNTER — Telehealth: Payer: Self-pay | Admitting: Family Medicine

## 2024-06-12 NOTE — Telephone Encounter (Signed)
 Called patient to reschedule appointment with Jamie. Patient did not answer left VM with office number for patient to call to reschedule.

## 2024-06-14 ENCOUNTER — Encounter

## 2024-06-16 NOTE — Progress Notes (Signed)
   Acute Office Visit  Subjective:     Patient ID: Pamela Klein, female    DOB: [birth year], 33 y.o.   MRN: [id number]  No chief complaint on file.   HPI  Patient presents with recurrent vaginal irritation and discharge. She reports white, milky discharge with mild odor that began a few days ago. She was Tx for BV last OV with metronidazole  gel. Symptoms include itchiness and irritation, which temporarily improve with topical treatment but recur shortly after. She denies rash, fever, dysuria, vaginal bleeding, or other associated symptoms. History is notable for recurrent episodes of bacterial vaginosis and yeast infections with similar presentations in the past.  Patient denies fever, chills, SOB, CP, palpitations, dyspnea, edema, HA, vision changes, N/V/D, abdominal pain, urinary symptoms, rash, weight changes, and recent illness or hospitalizations.   ROS  See HPI    Objective:    BP 133/84   Pulse 82   Ht 5' 5 (1.651 m)   Wt 210 lb 6.4 oz (95.4 kg)   LMP 04/27/2024 (Exact Date)   SpO2 100%   BMI 35.01 kg/m    Physical Exam General: No acute distress. Awake and conversant.  Eyes: Normal conjunctiva, anicteric. Round symmetric pupils.  Respiratory: CTAB. Respirations are non-labored. No wheezing.  Skin: Warm. No rashes or ulcers.  Psych: Alert and oriented. Cooperative, Appropriate mood and affect, Normal judgment.  CV: RRR. No murmur. No lower extremity edema.  MSK: Normal ambulation. No clubbing or cyanosis.  Neuro:  CN II-XII grossly normal.    Results for orders placed or performed in visit on 06/01/24  SureSwab Advanced Vaginitis Plus,TMA   Specimen: Vaginal Swab  Result Value Ref Range   SURESWAB(R) ADV BACTERIAL VAGINOSIS(BV),TMA NEGATIVE NEGATIVE   CANDIDA SPECIES NOT DETECTED NOT DETECTED   Candida glabrata NOT DETECTED NOT DETECTED   TRICHOMONAS VAGINALIS (TV),TMA NOT DETECTED NOT DETECTED   C. trachomatis RNA, TMA NOT DETECTED NOT DETECTED   N.  gonorrhoeae RNA, TMA NOT DETECTED NOT DETECTED  POCT urine pregnancy  Result Value Ref Range   Preg Test, Ur Negative Negative        Assessment & Plan:   Problem List Items Addressed This Visit     Vaginal discharge - Primary   Relevant Orders   SureSwab Advanced Vaginitis Plus,TMA (Completed)   Other Visit Diagnoses       Amenorrhea       Relevant Orders   POCT urine pregnancy (Completed)     Pt has Hx of recurrent BV, cervical swab pending. Will Tx empirically. Rx. Metronidazole  oral x 7 days, advise no ETOH while on this medication; Vaginal Boric Acid x 30 days, this is OTC. RTC if symptoms persist or worsen  Meds ordered this encounter  Medications   metroNIDAZOLE  (FLAGYL ) 500 MG tablet    Sig: Take 1 tablet (500 mg total) by mouth 3 (three) times daily for 7 days.    Dispense:  21 tablet    Refill:  0    Supervising Provider:   DOMENICA BLACKBIRD A [[id number]]    No follow-ups on file.  Pamela Henthorn L Tayden Nichelson, NP

## 2024-07-05 ENCOUNTER — Other Ambulatory Visit

## 2024-07-09 ENCOUNTER — Ambulatory Visit: Admitting: Clinical

## 2024-07-09 DIAGNOSIS — F4323 Adjustment disorder with mixed anxiety and depressed mood: Secondary | ICD-10-CM

## 2024-07-09 NOTE — BH Specialist Note (Unsigned)
 Integrated Behavioral Health via Telemedicine Visit  07/09/2024 Pamela Klein [id number]  Number of Integrated Behavioral Health Clinician visits: 5-Fifth Visit  Session Start time: 1538   Session End time: 1615  Total time in minutes: 37  Referring Provider: Lang Peel, DO Patient/Family location: Home [facility] Provider location: [facility]  All persons participating in visit: [name] and [facility] Pamela Klein   Types of Service: Individual psychotherapy and Video visit  I connected with Pamela Klein and/or Pamela Klein's n/a via  Telephone or Engineer, Civil (consulting)  (Video is Caregility application) and verified that I am speaking with the correct person using two identifiers. Discussed confidentiality: Yes   I discussed the limitations of telemedicine and the availability of in person appointments.  Discussed there is a possibility of technology failure and discussed alternative modes of communication if that failure occurs.  I discussed that engaging in this telemedicine visit, they consent to the provision of behavioral healthcare and the services will be billed under their insurance.  Patient and/or legal guardian expressed understanding and consented to Telemedicine visit: Yes   Presenting Concerns: Patient and/or family reports the following symptoms/concerns: Processing communication issues in current relationship; suspects his undiagnosed and untreated ADHD and anxiety may be contributing to the issues they are experiencing; mixed emotions regarding next step.  Duration of problem: Increasing over time; Severity of problem: moderate  Patient and/or Family's Strengths/Protective Factors: Social connections, Social and Emotional competence, Concrete supports in place (healthy food, safe environments, etc.), Sense of purpose, and Physical Health (exercise, healthy diet,  medication compliance, etc.)  Goals Addressed: Patient will:  Reduce symptoms of: anxiety and stress   Increase knowledge and/or ability of: stress reduction   Demonstrate ability to: Increase adequate support systems for patient/family  Progress towards Goals: Ongoing    Interventions: Interventions utilized:  Link to [employer] and Supportive Reflection Standardized Assessments completed: Not Needed  Patient and/or Family Response: Patient agrees with treatment plan.  Clinical Assessment/Diagnosis  No diagnosis found.   Patient may benefit from continued therapeutic intervention   Plan: Follow up with behavioral health clinician on : Three weeks Behavioral recommendations:  -Share resource with partner about [employer] Psychology assessments for adults (consider presenting this to him as a way to better understand himself and possible ADHD in child(ren) in the future) -Continue daily self-coping strategies that have remained helpful (grounding exercises; worry time; journaling; exercise; quality time with chilren; positive self-talk; mindfulness, etc.) Referral(s): Integrated Art Gallery Manager (In Clinic) and Community Resources:  [employer] ADHD testing for partner  I discussed the assessment and treatment plan with the patient and/or parent/guardian. They were provided an opportunity to ask questions and all were answered. They agreed with the plan and demonstrated an understanding of the instructions.   They were advised to call back or seek an in-person evaluation if the symptoms worsen or if the condition fails to improve as anticipated.  Pamela JAYSON Mering, LCSW     06/01/2024    3:56 PM 02/17/2024   11:15 AM 09/12/2023    9:37 AM 04/07/2023    2:47 PM  Depression screen PHQ 2/9  Decreased Interest 0 0 0 0  Down, Depressed, Hopeless 0 0 0 0  PHQ - 2 Score 0 0 0 0  Altered sleeping  0 1 2  Tired, decreased energy  0 1 1  Change in appetite  0 1 1  Feeling bad  or failure about yourself  0 0 0  Trouble concentrating  0 0 0  Moving slowly or fidgety/restless  0 0 0  Suicidal thoughts  0 0 0  PHQ-9 Score  0 3 4  Difficult doing work/chores  Not difficult at all        04/07/2023    2:47 PM  GAD 7 : Generalized Anxiety Score  Nervous, Anxious, on Edge 1  Control/stop worrying 0  Worry too much - different things 0  Trouble relaxing 1  Restless 0  Easily annoyed or irritable 0  Afraid - awful might happen 0  Total GAD 7 Score 2

## 2024-07-09 NOTE — Patient Instructions (Signed)
 Center for [facility] at [facility] [address] [place], [place] [address] [phone] (main office) [phone] [facility] office)  [facility] [address] [place], [place]  [address]  ADHD testing for adults Learning disorder testing Psychological evaluations  [email or web address] [phone]  New Parent Support Groups www.postpartum.net [email or web address]  Psychology Today (search w filter couple counseling) www.psychologytoday.com  SCORE: For the life of your business www.score.org   [employer] [email or web address]

## 2024-07-15 ENCOUNTER — Other Ambulatory Visit: Payer: Self-pay | Admitting: Medical Genetics

## 2024-07-15 DIAGNOSIS — Z006 Encounter for examination for normal comparison and control in clinical research program: Secondary | ICD-10-CM

## 2024-07-27 NOTE — BH Specialist Note (Deleted)
 Integrated Behavioral Health via Telemedicine Visit  07/27/2024 Sawsan Riggio [id number]  Number of Integrated Behavioral Health Clinician visits: 5-Fifth Visit  Session Start time: 1538   Session End time: 1615  Total time in minutes: 37  Referring Provider: Lang Peel, DO Patient/Family location: Home [facility] Provider location: [facility]  All persons participating in visit: [name] and [facility] Kshawn Canal ***  Types of Service: {CHL AMB TYPE OF [phone]}  I connected with [name] and/or Jolonda Manthey's {family members:20773} via  Telephone or Engineer, Civil (consulting)  (Video is Surveyor, mining) and verified that I am speaking with the correct person using two identifiers. Discussed confidentiality: Yes   I discussed the limitations of telemedicine and the availability of in person appointments.  Discussed there is a possibility of technology failure and discussed alternative modes of communication if that failure occurs.  I discussed that engaging in this telemedicine visit, they consent to the provision of behavioral healthcare and the services will be billed under their insurance.  Patient and/or legal guardian expressed understanding and consented to Telemedicine visit: Yes   Presenting Concerns: Patient and/or family reports the following symptoms/concerns: *** Duration of problem: ***; Severity of problem: {Mild/Moderate/Severe:20260}  Patient and/or Family's Strengths/Protective Factors: {CHL AMB BH PROTECTIVE FACTORS:[phone]}  Goals Addressed: Patient will:  Reduce symptoms of: {IBH Symptoms:21014056}   Increase knowledge and/or ability of: {IBH Patient Tools:21014057}   Demonstrate ability to: {IBH Goals:21014053}  Progress towards Goals: {CHL AMB BH PROGRESS TOWARDS GOALS:[phone]}    Interventions: Interventions utilized:  {IBH  Interventions:21014054} Standardized Assessments completed: {IBH Screening Tools:21014051}    Patient and/or Family Response: Patient agrees with treatment plan.   Clinical Assessment/Diagnosis  No diagnosis found.   Patient may benefit from continued therapeutic intervention *** .  Plan: Follow up with behavioral health clinician on : *** Behavioral recommendations: *** Referral(s): {IBH Referrals:21014055}  I discussed the assessment and treatment plan with the patient and/or parent/guardian. They were provided an opportunity to ask questions and all were answered. They agreed with the plan and demonstrated an understanding of the instructions.   They were advised to call back or seek an in-person evaluation if the symptoms worsen or if the condition fails to improve as anticipated.  [name], LCSW     06/01/2024    3:56 PM 02/17/2024   11:15 AM 09/12/2023    9:37 AM 04/07/2023    2:47 PM  Depression screen PHQ 2/9  Decreased Interest 0 0 0 0  Down, Depressed, Hopeless 0 0 0 0  PHQ - 2 Score 0 0 0 0  Altered sleeping  0 1 2  Tired, decreased energy  0 1 1  Change in appetite  0 1 1  Feeling bad or failure about yourself   0 0 0  Trouble concentrating  0 0 0  Moving slowly or fidgety/restless  0 0 0  Suicidal thoughts  0 0 0  PHQ-9 Score  0  3  4   Difficult doing work/chores  Not difficult at all       Data saved with a previous flowsheet row definition      04/07/2023    2:47 PM  GAD 7 : Generalized Anxiety Score  Nervous, Anxious, on Edge 1  Control/stop worrying 0  Worry too much - different things 0  Trouble relaxing 1  Restless 0  Easily annoyed or irritable 0  Afraid - awful might happen 0  Total GAD 7 Score 2

## 2024-07-30 ENCOUNTER — Encounter

## 2024-08-15 NOTE — BH Specialist Note (Signed)
   Pt did not arrive to video visit and did not answer the phone; Unable to leave message; left MyChart message for patient.

## 2024-08-22 ENCOUNTER — Ambulatory Visit

## 2024-08-22 DIAGNOSIS — Z91199 Patient's noncompliance with other medical treatment and regimen due to unspecified reason: Secondary | ICD-10-CM

## 2024-08-24 ENCOUNTER — Other Ambulatory Visit (HOSPITAL_COMMUNITY)
Admission: RE | Admit: 2024-08-24 | Discharge: 2024-08-24 | Disposition: A | Discharge status: Home / Self Care | Source: Ambulatory Visit | Attending: Family | Admitting: Family

## 2024-08-24 ENCOUNTER — Ambulatory Visit: Admitting: Family

## 2024-08-24 VITALS — BP 124/88 | HR 64 | Temp 98.0°F | Resp 16 | Ht 65.0 in | Wt 207.0 lb

## 2024-08-24 DIAGNOSIS — L299 Pruritus, unspecified: Secondary | ICD-10-CM | POA: Insufficient documentation

## 2024-08-24 DIAGNOSIS — F419 Anxiety disorder, unspecified: Secondary | ICD-10-CM

## 2024-08-24 DIAGNOSIS — J45909 Unspecified asthma, uncomplicated: Secondary | ICD-10-CM | POA: Insufficient documentation

## 2024-08-24 DIAGNOSIS — N761 Subacute and chronic vaginitis: Secondary | ICD-10-CM | POA: Diagnosis not present

## 2024-08-24 MED ORDER — HYDROXYZINE PAMOATE 25 MG PO CAPS: 25.0000 mg | ORAL_CAPSULE | Freq: Three times a day (TID) | ORAL | 0 refills | Status: AC | PRN

## 2024-08-24 NOTE — Assessment & Plan Note (Signed)
" °  Intermittent itchy outbreaks suggestive of allergic contact dermatitis, possibly related to Febreze or Glad spray. Known cat allergy, possible sensitivity to scented laundry products. - Referred to allergist for further evaluation. - Advised discontinuation of Febreze and Glad spray. - Recommended unscented laundry products and fabric softeners. - Prescribed hydroxyzine  for severe itching, cautioning about drowsiness. - Suggested Claritin for daily management of mild symptoms. "

## 2024-08-24 NOTE — Assessment & Plan Note (Signed)
 Stable without medicaiton. Continue therapy.

## 2024-08-24 NOTE — Patient Instructions (Addendum)
 " VISIT SUMMARY: Today, you were seen for irritation in the vaginal area and requested a referral to an allergist. We discussed your symptoms, including the irritation that worsens with hair regrowth after waxing, and your concerns about possible allergies to certain sprays and scented laundry products. We also reviewed your history of gestational diabetes, irregular menstrual cycles, intermittent cough, and postpartum anxiety.  YOUR PLAN: -VULVOVAGINAL PRURITUS AND IRRITATION: This condition involves itching and irritation around the vaginal area, particularly where hair grows. We will repeat a swab test to rule out infections. Use Dove unscented or sensitive Dove for cleansing and avoid Dial. You can apply hydrocortisone cream for external irritation if needed and consume yogurt for probiotics.  -ALLERGIC CONTACT DERMATITIS: This is a skin reaction that occurs when you come into contact with an allergen. You are referred to an allergist for further evaluation. Stop using Febreze and Glad spray, and switch to unscented laundry products and fabric softeners. Hydroxyzine  is prescribed for severe itching, but it may cause drowsiness. Claritin can be used daily for mild symptoms.  -ASTHMA: Asthma is a condition that affects your airways and can cause coughing, especially during exercise. Continue using your albuterol  inhaler as needed before exercise.  -IRREGULAR MENSTRUAL CYCLES: Your menstrual cycles have become more regular, which is a positive development.  -POSTPARTUM ANXIETY: This is anxiety that occurs after childbirth. Your symptoms are improving with ongoing therapy, so continue with your current therapy sessions.  INSTRUCTIONS: Follow up with the allergist as referred. Continue using your albuterol  inhaler as needed before exercise. Use Dove unscented or sensitive Dove for cleansing and avoid Dial. Apply hydrocortisone cream for external irritation if needed. Consume yogurt for probiotics. Stop  using Febreze and Glad spray, and switch to unscented laundry products and fabric softeners. Use hydroxyzine  for severe itching and Claritin for daily management of mild symptoms. Continue therapy for postpartum anxiety.         VISIT SUMMARY: Today, you were seen for irritation in the vaginal area and requested a referral to an allergist. We discussed your symptoms, including the irritation that worsens with hair regrowth after waxing, and your concerns about possible allergies to certain sprays and scented laundry products. We also reviewed your history of gestational diabetes, irregular menstrual cycles, intermittent cough, and postpartum anxiety.  YOUR PLAN: -VULVOVAGINAL PRURITUS AND IRRITATION: This condition involves itching and irritation around the vaginal area, particularly where hair grows. We will repeat a swab test to rule out infections. Use Dove unscented or sensitive Dove for cleansing and avoid Dial. You can apply hydrocortisone cream for external irritation if needed and consume yogurt for probiotics.  -ALLERGIC CONTACT DERMATITIS: This is a skin reaction that occurs when you come into contact with an allergen. You are referred to an allergist for further evaluation. Stop using Febreze and Glad spray, and switch to unscented laundry products and fabric softeners. Hydroxyzine  is prescribed for severe itching, but it may cause drowsiness. Claritin can be used daily for mild symptoms.  -ASTHMA: Asthma is a condition that affects your airways and can cause coughing, especially during exercise. Continue using your albuterol  inhaler as needed before exercise.  -IRREGULAR MENSTRUAL CYCLES: Your menstrual cycles have become more regular, which is a positive development.  -POSTPARTUM ANXIETY: This is anxiety that occurs after childbirth. Your symptoms are improving with ongoing therapy, so continue with your current therapy sessions.  INSTRUCTIONS: Follow up with the allergist as  referred. Continue using your albuterol  inhaler as needed before exercise. Use Dove unscented  or sensitive Dove for cleansing and avoid Dial. Apply hydrocortisone cream for external irritation if needed. Consume yogurt for probiotics. Stop using Febreze and Glad spray, and switch to unscented laundry products and fabric softeners. Use hydroxyzine  for severe itching and Claritin for daily management of mild symptoms. Continue therapy for postpartum anxiety.                                                       "

## 2024-08-24 NOTE — Assessment & Plan Note (Signed)
" °  Intermittent pruritus and irritation with occasional discharge. Symptoms not typical of yeast infection or bacterial vaginosis. Possible sensitivity to personal care products. - Repeated swab to rule out yeast infection or bacterial vaginosis. - Recommended Dove sensitive skin Dove for cleansing. - Advised against using Dial. -hydroxyzine  prn itching - Suggested hydrocortisone cream for external irritation if needed. - Encouraged consumption of yogurt for probiotics.  "

## 2024-08-24 NOTE — Assessment & Plan Note (Signed)
" °  Intermittent cough, especially during exercise. Albuterol  inhaler effective. - Continue albuterol  inhaler as needed before exercise.  "

## 2024-08-24 NOTE — Progress Notes (Signed)
 "  Subjective:     Patient ID: Pamela Klein, female    DOB: 1990-11-02, 33 y.o.   MRN: [id number]  Chief Complaint  Patient presents with   Prediabetes    Here for follow up   Vaginitis    Patient still complaining of vaginal discomfort     HPI  Discussed the use of AI scribe software for clinical note transcription with the patient, who gave verbal consent to proceed.  History of Present Illness Lab Results  Component Value Date   HGBA1C 5.6 12/28/2023      Pamela Klein is a 33 year old female who presents with vaginitis and requests a referral to an allergist.  She experiences irritation in the vaginal area, particularly around the hair growth region, but not inside the vagina. The discomfort tends to increase when hair starts to grow back after waxing. No significant discharge is noted, only occasional small spots of white, dried discharge that are not present daily. She has tried suppositories, which worsened her symptoms, and a probiotic supplement that upset her stomach, leading her to discontinue both treatments.  She requests a referral to an allergist due to outbreaks of itching, which she suspects may be related to certain sprays used in her home. She is aware of her allergy to cats and has been using Benadryl  for relief, although it causes drowsiness. She has recently started using scented laundry products, which she suspects may be contributing to her symptoms.  She has a history of gestational diabetes. Her A1c levels were in the normal range at her last check. She also mentions irregular menstrual cycles, which have started to become more regular, lasting seven days and occurring towards the end or beginning of the month.  She experiences an intermittent cough, particularly during exercise, and has used an inhaler with good effect. No eczema is present, but she reports general dryness of the skin. She also mentions postpartum anxiety, which has been  improving with ongoing therapy.  Health Maintenance Due  Topic Date Due   Hepatitis B Vaccines 19-59 Average Risk (1 of 3 - 19+ 3-dose series) Never done   Pneumococcal Vaccine (2 of 2 - PCV) 05/02/2014   HPV VACCINES (1 - 3-dose SCDM series) Never done   Influenza Vaccine  Never done   COVID-19 Vaccine (3 - 2025-26 season) 05/07/2024    Past Medical History:  Diagnosis Date   Cannabis abuse 05/01/2013   Chlamydia    Chronic back pain    Community acquired pneumonia 05/01/2013   Gestational diabetes    Sepsis (HCC) 05/01/2013   Pt thinks related to pneumonia   Vitamin D deficiency 11/28/2017    Past Surgical History:  Procedure Laterality Date   NO PAST SURGERIES      Family History  Problem Relation Age of Onset   Hypertension Father    Diabetes Paternal Grandmother        diet control   Cataracts Paternal Grandfather     Social History   Socioeconomic History   Marital status: Single    Spouse name: Not on file   Number of children: Not on file   Years of education: Not on file   Highest education level: Master's degree (e.g., MA, MS, MEng, MEd, MSW, MBA)  Occupational History   Not on file  Tobacco Use   Smoking status: Former    Current packs/day: 0.00    Types: Cigarettes    Start date: 04/05/2008    Quit date: 04/06/2011    Years since quitting: 13.3   Smokeless tobacco: Never   Tobacco comments:    05/01/2013 smoked Black N Milds  Vaping Use   Vaping status: Never Used  Substance and Sexual Activity   Alcohol use: Yes    Alcohol/week: 2.0 standard drinks of alcohol    Types: 2 Glasses of wine per week    Comment: socially   Drug use: Not  Currently   Sexual activity: Yes    Partners: Male    Birth control/protection: Condom    Comment: not since July  Other Topics Concern   Not on file  Social History Narrative   Mental Health Clinical Social Worker   Daughter 2013 Nevaeh   Son 2025 [name]   Single   Enjoys exercise, cooking, reading   Completed Masters degree   Social Drivers of Health   Tobacco Use: Medium Risk (06/01/2024)   Patient History    Smoking Tobacco Use: Former    Smokeless Tobacco Use: Never    Passive Exposure: Not on Actuary Strain: Low Risk (08/22/2024)   Overall Financial Resource Strain (CARDIA)    Difficulty of Paying Living Expenses: Not hard at all  Food Insecurity: No Food Insecurity (08/22/2024)   Epic    Worried About Radiation Protection Practitioner of Food in the Last Year: Never true    Ran Out of Food in the Last Year: Never true  Transportation Needs: No Transportation Needs (08/22/2024)   Epic    Lack of Transportation (Medical): No    Lack of Transportation (Non-Medical): No  Physical Activity: Sufficiently Active (08/22/2024)   Exercise Vital Sign    Days of Exercise per Week: 7 days    Minutes of Exercise per Session: 60 min  Stress: No Stress Concern Present (08/22/2024)   [employer] of Occupational Health - Occupational Stress Questionnaire    Feeling of Stress: Not at all  Social Connections: Moderately Isolated (08/22/2024)   Social Connection and Isolation Panel  Frequency of Communication with Friends and Family: More than three times a week    Frequency of Social Gatherings with Friends and Family: More than three times a week    Attends Religious Services: More than 4 times per year    Active Member of [employer] or Organizations: No    Attends Engineer, Structural: Not on file    Marital Status: Never married  Intimate Partner Violence: Not At Risk (10/05/2023)   Humiliation, Afraid, Rape, and Kick questionnaire    Fear of Current or Ex-Partner: No     Emotionally Abused: No    Physically Abused: No    Sexually Abused: No  Depression (PHQ2-9): Low Risk (08/24/2024)   Depression (PHQ2-9)    PHQ-2 Score: 0  Alcohol Screen: Low Risk (08/22/2024)   Alcohol Screen    Last Alcohol Screening Score (AUDIT): 2  Housing: Unknown (08/22/2024)   Epic    Unable to Pay for Housing in the Last Year: No    Number of Times Moved in the Last Year: Not on file    Homeless in the Last Year: No  Utilities: Not At Risk (10/05/2023)   [facility] Utilities    Threatened with loss of utilities: No  Health Literacy: Not on file    Outpatient Medications Prior to Visit  Medication Sig Dispense Refill   ibuprofen  (ADVIL ) 800 MG tablet TAKE 1 TABLET BY MOUTH THREE TIMES DAILY AS NEEDED FOR MODERATE PAIN. 60 tablet 3   albuterol  (VENTOLIN  HFA) 108 (90 Base) MCG/ACT inhaler Inhale 2 puffs into the lungs every 6 (six) hours as needed for wheezing or shortness of breath.     azithromycin  (ZITHROMAX ) 500 MG tablet Take 1 tablet (500 mg total) by mouth daily for 3 days. 3 tablet 0   loperamide  (IMODIUM  A-D) 2 MG tablet Take 1 tablet (2 mg total) by mouth 3 (three) times daily as needed for diarrhea or loose stools. 24 tablet 0   No facility-administered medications prior to visit.    Allergies[1]  ROS    See HPI Objective:    Physical Exam Constitutional:      Appearance: Normal appearance.  Cardiovascular:     Rate and Rhythm: Normal rate.  Pulmonary:     Effort: Pulmonary effort is normal.  Genitourinary:    General: Normal vulva.     Labia:        Right: No rash or lesion.        Left: No rash or lesion.   Neurological:     Mental Status: She is alert.  Psychiatric:        Attention and Perception: Attention normal.        Mood and Affect: Mood normal.        Speech: Speech normal.        Behavior: Behavior normal.        Cognition and Memory: Cognition normal.      BP 124/88 (BP Location: Right Arm, Patient Position: Sitting, Cuff Size:  Large)   Pulse 64   Temp 98 F (36.7 C) (Oral)   Resp 16   Ht 5' 5 (1.651 m)   Wt 207 lb (93.9 kg)   SpO2 98%   BMI 34.45 kg/m  Wt Readings from Last 3 Encounters:  08/24/24 207 lb (93.9 kg)  06/01/24 210 lb 6.4 oz (95.4 kg)  04/12/24 208 lb 6.4 oz (94.5 kg)       Assessment & Plan:   Problem List Items Addressed This  Visit       Unprioritized   Uncomplicated asthma    Intermittent cough, especially during exercise. Albuterol  inhaler effective. - Continue albuterol  inhaler as needed before exercise.       Pruritus - Primary    Intermittent itchy outbreaks suggestive of allergic contact dermatitis, possibly related to Febreze or Glad spray. Known cat allergy, possible sensitivity to scented laundry products. - Referred to allergist for further evaluation. - Advised discontinuation of Febreze and Glad spray. - Recommended unscented laundry products and fabric softeners. - Prescribed hydroxyzine  for severe itching, cautioning about drowsiness. - Suggested Claritin for daily management of mild symptoms.      Relevant Medications   hydrOXYzine  (VISTARIL ) 25 MG capsule   Other Relevant Orders   Ambulatory referral to Allergy   Chronic vaginitis    Intermittent pruritus and irritation with occasional discharge. Symptoms not typical of yeast infection or bacterial vaginosis. Possible sensitivity to personal care products. - Repeated swab to rule out yeast infection or bacterial vaginosis. - Recommended Dove sensitive skin Dove for cleansing. - Advised against using Dial. -hydroxyzine  prn itching - Suggested hydrocortisone cream for external irritation if needed. - Encouraged consumption of yogurt for probiotics.       Relevant Orders   Cervicovaginal ancillary only( [place])   Anxiety   Stable without medicaiton. Continue therapy.       Relevant Medications   hydrOXYzine  (VISTARIL ) 25 MG capsule    Assessment & Plan    I have discontinued Ruthmary  Salle Aqua's albuterol , loperamide , and azithromycin . I am also having her start on hydrOXYzine . Additionally, I am having her maintain her ibuprofen .  Meds ordered this encounter  Medications   hydrOXYzine  (VISTARIL ) 25 MG capsule    Sig: Take 1 capsule (25 mg total) by mouth every 8 (eight) hours as needed.    Dispense:  30 capsule    Refill:  0    Supervising Provider:   DOMENICA BLACKBIRD A [[id number]]      [1] Not on File  "

## 2024-08-27 ENCOUNTER — Ambulatory Visit: Payer: Self-pay | Admitting: Family

## 2024-08-27 DIAGNOSIS — B9689 Other specified bacterial agents as the cause of diseases classified elsewhere: Secondary | ICD-10-CM

## 2024-08-27 LAB — CERVICOVAGINAL ANCILLARY ONLY
Bacterial Vaginitis (gardnerella): POSITIVE — AB
Candida Glabrata: NEGATIVE
Candida Vaginitis: NEGATIVE
Chlamydia: NEGATIVE
Comment: NEGATIVE
Comment: NEGATIVE
Comment: NEGATIVE
Comment: NEGATIVE
Comment: NEGATIVE
Comment: NORMAL
Neisseria Gonorrhea: NEGATIVE
Trichomonas: NEGATIVE

## 2024-08-27 MED ORDER — METRONIDAZOLE 0.75 % VA GEL: 1.0000 | Freq: Every day | VAGINAL | 0 refills | Status: AC

## 2024-08-27 NOTE — Telephone Encounter (Signed)
 Swab shows BV.  Rx sent for metrogel .

## 2024-09-17 ENCOUNTER — Ambulatory Visit: Payer: Self-pay | Admitting: Internal Medicine

## 2024-09-18 ENCOUNTER — Other Ambulatory Visit: Payer: Self-pay

## 2024-09-18 ENCOUNTER — Emergency Department (HOSPITAL_BASED_OUTPATIENT_CLINIC_OR_DEPARTMENT_OTHER)
Admission: EM | Admit: 2024-09-18 | Discharge: 2024-09-18 | Disposition: A | Discharge status: Home / Self Care | Attending: Emergency Medicine | Admitting: Emergency Medicine

## 2024-09-18 ENCOUNTER — Emergency Department (HOSPITAL_BASED_OUTPATIENT_CLINIC_OR_DEPARTMENT_OTHER): Admitting: Radiology

## 2024-09-18 ENCOUNTER — Encounter (HOSPITAL_BASED_OUTPATIENT_CLINIC_OR_DEPARTMENT_OTHER): Payer: Self-pay | Admitting: Emergency Medicine

## 2024-09-18 ENCOUNTER — Ambulatory Visit: Payer: Self-pay

## 2024-09-18 DIAGNOSIS — J181 Lobar pneumonia, unspecified organism: Secondary | ICD-10-CM | POA: Insufficient documentation

## 2024-09-18 DIAGNOSIS — D72829 Elevated white blood cell count, unspecified: Secondary | ICD-10-CM | POA: Diagnosis not present

## 2024-09-18 DIAGNOSIS — J45909 Unspecified asthma, uncomplicated: Secondary | ICD-10-CM | POA: Diagnosis not present

## 2024-09-18 DIAGNOSIS — R059 Cough, unspecified: Secondary | ICD-10-CM | POA: Diagnosis present

## 2024-09-18 DIAGNOSIS — J189 Pneumonia, unspecified organism: Secondary | ICD-10-CM

## 2024-09-18 DIAGNOSIS — E871 Hypo-osmolality and hyponatremia: Secondary | ICD-10-CM | POA: Diagnosis not present

## 2024-09-18 LAB — CBC WITH DIFFERENTIAL/PLATELET
Abs Immature Granulocytes: 0.05 K/uL (ref 0.00–0.07)
Basophils Absolute: 0 K/uL (ref 0.0–0.1)
Basophils Relative: 0 %
Eosinophils Absolute: 0 K/uL (ref 0.0–0.5)
Eosinophils Relative: 0 %
HCT: 39.5 % (ref 36.0–46.0)
Hemoglobin: 13.3 g/dL (ref 12.0–15.0)
Immature Granulocytes: 0 %
Lymphocytes Relative: 6 %
Lymphs Abs: 0.9 K/uL (ref 0.7–4.0)
MCH: 29.8 pg (ref 26.0–34.0)
MCHC: 33.7 g/dL (ref 30.0–36.0)
MCV: 88.6 fL (ref 80.0–100.0)
Monocytes Absolute: 1.2 K/uL — ABNORMAL HIGH (ref 0.1–1.0)
Monocytes Relative: 8 %
Neutro Abs: 12 K/uL — ABNORMAL HIGH (ref 1.7–7.7)
Neutrophils Relative %: 86 %
Platelets: 210 K/uL (ref 150–400)
RBC: 4.46 MIL/uL (ref 3.87–5.11)
RDW: 14 % (ref 11.5–15.5)
WBC: 14.2 K/uL — ABNORMAL HIGH (ref 4.0–10.5)
nRBC: 0 % (ref 0.0–0.2)

## 2024-09-18 LAB — RESP PANEL BY RT-PCR (RSV, FLU A&B, COVID)  RVPGX2
Influenza A by PCR: NEGATIVE
Influenza B by PCR: NEGATIVE
Resp Syncytial Virus by PCR: NEGATIVE
SARS Coronavirus 2 by RT PCR: NEGATIVE

## 2024-09-18 LAB — BASIC METABOLIC PANEL WITH GFR
Anion gap: 13 (ref 5–15)
BUN: <5 mg/dL — ABNORMAL LOW (ref 6–20)
CO2: 24 mmol/L (ref 22–32)
Calcium: 9.2 mg/dL (ref 8.9–10.3)
Chloride: 98 mmol/L (ref 98–111)
Creatinine, Ser: 0.56 mg/dL (ref 0.44–1.00)
GFR, Estimated: >60 mL/min
Glucose, Bld: 97 mg/dL (ref 70–99)
Potassium: 3.7 mmol/L (ref 3.5–5.1)
Sodium: 134 mmol/L — ABNORMAL LOW (ref 135–145)

## 2024-09-18 LAB — PREGNANCY, URINE: Preg Test, Ur: NEGATIVE

## 2024-09-18 MED ORDER — KETOROLAC TROMETHAMINE 15 MG/ML IJ SOLN
15.0000 mg | Freq: Once | INTRAMUSCULAR | Status: AC
  Administered 2024-09-18: 15 mg via INTRAVENOUS

## 2024-09-18 MED ORDER — AZITHROMYCIN 250 MG PO TABS: 500.0000 mg | ORAL_TABLET | Freq: Every day | ORAL | 0 refills | Status: AC

## 2024-09-18 MED ORDER — SODIUM CHLORIDE 0.9 % IV BOLUS
500.0000 mL | Freq: Once | INTRAVENOUS | Status: AC
  Administered 2024-09-18: 500 mL via INTRAVENOUS

## 2024-09-18 MED ORDER — METOCLOPRAMIDE HCL 5 MG/ML IJ SOLN
5.0000 mg | Freq: Once | INTRAMUSCULAR | Status: AC
  Administered 2024-09-18: 5 mg via INTRAVENOUS
  Filled 2024-09-18: qty 2

## 2024-09-18 MED ORDER — AMOXICILLIN-POT CLAVULANATE 875-125 MG PO TABS: 1.0000 | ORAL_TABLET | Freq: Two times a day (BID) | ORAL | 0 refills | Status: AC

## 2024-09-18 MED ORDER — ACETAMINOPHEN 325 MG PO TABS
650.0000 mg | ORAL_TABLET | Freq: Once | ORAL | Status: AC
  Administered 2024-09-18: 650 mg via ORAL
  Filled 2024-09-18: qty 2

## 2024-09-18 MED ORDER — SODIUM CHLORIDE 0.9 % IV SOLN
1.0000 g | Freq: Once | INTRAVENOUS | Status: AC
  Administered 2024-09-18: 1 g via INTRAVENOUS
  Filled 2024-09-18: qty 10

## 2024-09-18 MED ORDER — DIPHENHYDRAMINE HCL 50 MG/ML IJ SOLN
25.0000 mg | Freq: Once | INTRAMUSCULAR | Status: AC
  Administered 2024-09-18: 25 mg via INTRAVENOUS
  Filled 2024-09-18: qty 1

## 2024-09-18 MED ORDER — ALBUTEROL SULFATE HFA 108 (90 BASE) MCG/ACT IN AERS
1.0000 | INHALATION_SPRAY | Freq: Once | RESPIRATORY_TRACT | Status: AC
  Administered 2024-09-18: 1 via RESPIRATORY_TRACT
  Filled 2024-09-18: qty 6.7

## 2024-09-18 MED ORDER — AZITHROMYCIN 250 MG PO TABS
500.0000 mg | ORAL_TABLET | Freq: Once | ORAL | Status: AC
  Administered 2024-09-18: 500 mg via ORAL
  Filled 2024-09-18: qty 2

## 2024-09-18 MED ORDER — KETOROLAC TROMETHAMINE 15 MG/ML IJ SOLN
15.0000 mg | Freq: Once | INTRAMUSCULAR | Status: DC
  Filled 2024-09-18: qty 1

## 2024-09-18 NOTE — Discharge Instructions (Signed)
 Please follow-up with your primary care provider in the next 48-72 hours.  Seek emergency care experiencing any new or worsening symptoms.  Alternating between 650 mg Tylenol and 400 mg Advil : The best way to alternate taking Acetaminophen (example Tylenol) and Ibuprofen  (example Advil /Motrin ) is to take them 3 hours apart. For example, if you take ibuprofen  at 6 am you can then take Tylenol at 9 am. You can continue this regimen throughout the day, making sure you do not exceed the recommended maximum dose for each drug.

## 2024-09-18 NOTE — Telephone Encounter (Signed)
 FYI Only or Action Required?: FYI only for provider: ED advised.  Patient was last seen in primary care on 08/24/2024 by Daryl Setter, NP.  Called Nurse Triage reporting Breathing Problem and Headache.  Symptoms began 2 days ago.  Interventions attempted: OTC medications: Mucinex , Benadryl  and Rest, hydration, or home remedies.  Symptoms are: rapidly worsening.  Triage Disposition: Go to ED Now (Notify PCP)  Patient/caregiver understands and will follow disposition?: Yes   Reason for Disposition  MODERATE difficulty breathing (e.g., speaks in phrases, SOB even at rest, pulse 100-120)  Answer Assessment - Initial Assessment Questions 1. SYMPTOMS: What is your main symptom or concern? (e.g., cough, fever, shortness of breath, muscle aches)     Burning when breathing or coughing 2. ONSET: When did the symptoms start?      2 days  3. COUGH: Do you have a cough? If Yes, ask: How bad is the cough?       no 4. FEVER: Do you have a fever? If Yes, ask: What is your temperature, how was it measured, and when did it start?     Felt feverish 5. BREATHING DIFFICULTY: Are you having any difficulty breathing? (e.g., normal; shortness of breath, wheezing, unable to speak)      Yes-burning sensation in lungs on inspiration, worse when talking. Feels like when she had pneumonia in the past. Speaking in phrases 6. BETTER-SAME-WORSE: Are you getting better, staying the same or getting worse compared to yesterday?  If getting worse, ask, In what way?     worse 7. OTHER SYMPTOMS: Do you have any other symptoms?  (e.g., chills, fatigue, headache, loss of smell or taste, muscle pain, sore throat)     Headache 6/10 X 2 days and worsening, nausea-still eating but small amounts  Protocols used: Influenza (Flu) Suspected-A-AH Copied from CRM [id number]. Topic: Clinical - Red Word Triage >> Sep 18, 2024  8:59 AM Lonell PEDLAR wrote: Red Word that prompted transfer to Nurse Triage: hurts  to breathe, feels burning sensation when breathing, headache, symptoms worsening in the past 2 days. Nausea after eating with occasional vomiting.

## 2024-09-18 NOTE — ED Notes (Signed)
 Pt ambulated hallway with cont pulse ox. Prior to walking pt HR 91 SpO2 99%. During ambulation pt expressed dizziness and feeling 'tired'. SpO2 was lowest at 93% and HR 110. Upon sitting, pt stated symptoms resolved. While sitting SpO2 improved to 100%. HR maintained at 108

## 2024-09-18 NOTE — ED Triage Notes (Signed)
 Reports cough, congestion, and bodyaches since Friday.

## 2024-09-18 NOTE — ED Provider Notes (Signed)
 "  EMERGENCY DEPARTMENT AT [facility] Provider Note   CSN: [id number] Arrival date & time: 09/18/24  9049     Patient presents with: Cough   Pamela Klein is a 34 y.o. female with PMHx chronic back pain, asthma who presents to ED concerned for fever, cough, congestion, body aches, headaches over the past 4 days. Endorses multiple sick contracts at work. States that her chest feels tight and she has pain with deep inspiration - but denies severe SOB. Patient was concerned that she might have PNA since symptoms presented similarly in the past. Patient also with some nausea but no recent vomiting or diarrhea.   Patient asking for medications to help relieve headache so she can finally get some good sleep.    Cough      Prior to Admission medications  Medication Sig Start Date End Date Taking? Authorizing Provider  amoxicillin -clavulanate (AUGMENTIN ) 875-125 MG tablet Take 1 tablet by mouth every 12 (twelve) hours. 09/18/24  Yes Hoy Fraction F, PA-C  azithromycin  (ZITHROMAX ) 250 MG tablet Take 2 tablets (500 mg total) by mouth daily for 2 days. Take first 2 tablets together, then 1 every day until finished. 09/19/24 09/21/24 Yes Hoy Fraction F, PA-C  hydrOXYzine  (VISTARIL ) 25 MG capsule Take 1 capsule (25 mg total) by mouth every 8 (eight) hours as needed. 08/24/24   O'Sullivan, Melissa, NP  ibuprofen  (ADVIL ) 800 MG tablet TAKE 1 TABLET BY MOUTH THREE TIMES DAILY AS NEEDED FOR MODERATE PAIN. 04/20/24   Fredirick Glenys RAMAN, MD    Allergies: Patient has no allergy information on record.    Review of Systems  Respiratory:  Positive for cough.     Updated Vital Signs BP 133/81   Pulse 83   Temp 98.9 F (37.2 C) (Oral)   Resp 16   SpO2 100%   Physical Exam Vitals and nursing note reviewed.  Constitutional:      General: She is not in acute distress.    Appearance: She is not ill-appearing or toxic-appearing.  HENT:     Head: Normocephalic and atraumatic.      Mouth/Throat:     Mouth: Mucous membranes are moist.  Eyes:     General: No scleral icterus.       Right eye: No discharge.        Left eye: No discharge.     Conjunctiva/sclera: Conjunctivae normal.  Cardiovascular:     Rate and Rhythm: Normal rate and regular rhythm.     Pulses: Normal pulses.     Heart sounds: Normal heart sounds. No murmur heard.    Comments: Chest wall is tender to palpation Pulmonary:     Effort: Pulmonary effort is normal. No respiratory distress.     Breath sounds: Normal breath sounds. No wheezing, rhonchi or rales.  Abdominal:     General: Abdomen is flat.  Musculoskeletal:     Right lower leg: No edema.     Left lower leg: No edema.  Skin:    General: Skin is warm and dry.     Findings: No rash.  Neurological:     General: No focal deficit present.     Mental Status: She is alert and oriented to person, place, and time. Mental status is at baseline.     Comments: GCS 15. Speech is goal oriented. No deficits appreciated to CN III-XII. Patient moves extremities without ataxia. Patient ambulatory with steady gait.   Psychiatric:        Mood and Affect:  Mood normal.        Behavior: Behavior normal.     (all labs ordered are listed, but only abnormal results are displayed) Labs Reviewed  CBC WITH DIFFERENTIAL/PLATELET - Abnormal; Notable for the following components:      Result Value   WBC 14.2 (*)    Neutro Abs 12.0 (*)    Monocytes Absolute 1.2 (*)    All other components within normal limits  BASIC METABOLIC PANEL WITH GFR - Abnormal; Notable for the following components:   Sodium 134 (*)    BUN <5 (*)    All other components within normal limits  RESP PANEL BY RT-PCR (RSV, FLU A&B, COVID)  RVPGX2  PREGNANCY, URINE    EKG: None  Radiology: DG Chest 2 View Result Date: 09/18/2024 EXAM: 2 VIEW(S) XRAY OF THE CHEST 09/18/2024 10:34:00 AM COMPARISON: AP radiograph of the chest dated 10/06/2023. CLINICAL HISTORY: The patient presents  with a cough. FINDINGS: LUNGS AND PLEURA: Hazy opacity in the anterior segment of the right upper lobe, concerning for a focal pneumonia. There are also streaky opacities in the retrocardiac left lower lobe. No pleural effusion. No pneumothorax. HEART AND MEDIASTINUM: No acute abnormality of the cardiac and mediastinal silhouettes. BONES AND SOFT TISSUES: No acute osseous abnormality. IMPRESSION: 1. Hazy opacity in the anterior segment of the right upper lobe, concerning for focal pneumonia. 2. Streaky opacities in the retrocardiac left lower lobe, which may represent atelectasis versus additional infectious/inflammatory change. Electronically signed by: Evalene Coho MD MD 09/18/2024 10:49 AM EST RP Workstation: [id number]     Procedures   Medications Ordered in the ED  metoCLOPramide  (REGLAN ) injection 5 mg (5 mg Intravenous Given 09/18/24 1309)  diphenhydrAMINE  (BENADRYL ) injection 25 mg (25 mg Intravenous Given 09/18/24 1309)  acetaminophen  (TYLENOL ) tablet 650 mg (650 mg Oral Given 09/18/24 1309)  sodium chloride  0.9 % bolus 500 mL (0 mLs Intravenous Stopped 09/18/24 1410)  cefTRIAXone  (ROCEPHIN ) 1 g in sodium chloride  0.9 % 100 mL IVPB (0 g Intravenous Stopped 09/18/24 1349)  azithromycin  (ZITHROMAX ) tablet 500 mg (500 mg Oral Given 09/18/24 1309)  albuterol  (VENTOLIN  HFA) 108 (90 Base) MCG/ACT inhaler 1 puff (1 puff Inhalation Given 09/18/24 1331)  ketorolac  (TORADOL ) 15 MG/ML injection 15 mg (15 mg Intravenous Given 09/18/24 1315)                                    Medical Decision Making Amount and/or Complexity of Data Reviewed Labs: ordered. Radiology: ordered.  Risk OTC drugs. Prescription drug management.    This patient presents to the ED for concern of fever, headache, cough, congestion, this involves an extensive number of treatment options, and is a complaint that carries with it a high risk of complications and morbidity.  The differential diagnosis includes Flu/COVID/RSV,  sinusitis, pneumonia, meningitis.   Co morbidities that complicate the patient evaluation  Asthma   Additional history obtained:  Dr. Daryl PCP   Problem List / ED Course / Critical interventions / Medication management  Patient presents to ED concern for fever, headaches, congestion, cough, nausea over the past 4 days.  Physical exam reassuring.  Patient afebrile with stable vitals.  Patient requesting educations for headache which I have provided her with.  Will reevaluate patient after she receives medicines. Patient was able to maintain appropriate O2 sat on RA during ambulation.  I Ordered, and personally interpreted labs.  Respiratory panel negative.  UPT  negative.  CBC with leukocytosis of 14.2.  No anemia.  BMP with mild hyponatremia at 134. I ordered imaging studies including chest xray to assess for process contributing to patient's symptoms. I independently visualized and interpreted imaging which showed right upper lobe pneumonia - possible left lower lobe PNA vs atelectasis. I agree with the radiologist interpretation. Provided patient with migraine cocktail and IV antibiotics and IV fluids.  Patient stating that she is feeling a lot better afterwards and feels safe with continuing oral antibiotic management at home.  I have sent that ABX prescription.  I recommend following up with PCP.  Patient agreeable with plan.   I have reviewed the patients home medicines and have made adjustments as needed The patient has been appropriately medically screened and/or stabilized in the ED. I have low suspicion for any other emergent medical condition which would require further screening, evaluation or treatment in the ED or require inpatient management. At time of discharge the patient is hemodynamically stable and in no acute distress. I have discussed work-up results and diagnosis with patient and answered all questions. Patient is agreeable with discharge plan. We discussed strict  return precautions for returning to the emergency department and they verbalized understanding.      Social Determinants of Health:  none      Final diagnoses:  Pneumonia of right upper lobe due to infectious organism    ED Discharge Orders          Ordered    azithromycin  (ZITHROMAX ) 250 MG tablet  Daily        09/18/24 1451    amoxicillin -clavulanate (AUGMENTIN ) 875-125 MG tablet  Every 12 hours        09/18/24 1451               Hoy Nidia FALCON, [place] 09/18/24 1857    Dreama Longs, MD 09/18/24 2241  "

## 2024-09-18 NOTE — Telephone Encounter (Signed)
FYI. Pt going to ED.  

## 2024-09-28 ENCOUNTER — Ambulatory Visit: Admitting: Family

## 2025-02-22 ENCOUNTER — Ambulatory Visit: Admitting: Family
